# Patient Record
Sex: Female | Born: 1970 | Race: Black or African American | Hispanic: No | Smoking: Former smoker
Health system: Southern US, Community
[De-identification: ages and names within clinical notes are randomized; demographics above are authoritative.]

## PROBLEM LIST (undated history)

## (undated) DIAGNOSIS — F32A Depression, unspecified: Secondary | ICD-10-CM

## (undated) DIAGNOSIS — F419 Anxiety disorder, unspecified: Secondary | ICD-10-CM

## (undated) DIAGNOSIS — K219 Gastro-esophageal reflux disease without esophagitis: Secondary | ICD-10-CM

## (undated) DIAGNOSIS — I10 Essential (primary) hypertension: Secondary | ICD-10-CM

## (undated) DIAGNOSIS — Z860100 Personal history of colon polyps, unspecified: Secondary | ICD-10-CM

## (undated) DIAGNOSIS — U071 COVID-19: Secondary | ICD-10-CM

## (undated) DIAGNOSIS — J45909 Unspecified asthma, uncomplicated: Secondary | ICD-10-CM

## (undated) DIAGNOSIS — F4310 Post-traumatic stress disorder, unspecified: Secondary | ICD-10-CM

## (undated) DIAGNOSIS — M1990 Unspecified osteoarthritis, unspecified site: Secondary | ICD-10-CM

## (undated) HISTORY — DX: Depression, unspecified: F32.A

## (undated) HISTORY — PX: DILATION AND CURETTAGE OF UTERUS: SHX78

## (undated) HISTORY — PX: COLONOSCOPY: SHX174

## (undated) HISTORY — DX: Personal history of colon polyps, unspecified: Z86.0100

## (undated) HISTORY — PX: OTHER SURGICAL HISTORY: SHX169

## (undated) HISTORY — DX: Post-traumatic stress disorder, unspecified: F43.10

## (undated) HISTORY — PX: ABDOMINAL HYSTERECTOMY: SHX81

## (undated) HISTORY — DX: Personal history of colonic polyps: Z86.010

---

## 2011-11-22 ENCOUNTER — Encounter: Payer: Self-pay | Admitting: *Deleted

## 2011-11-22 ENCOUNTER — Emergency Department
Admission: EM | Admit: 2011-11-22 | Discharge: 2011-11-22 | Disposition: A | Discharge status: Home / Self Care | Payer: BC Managed Care – PPO | Source: Home / Self Care

## 2011-11-22 DIAGNOSIS — J069 Acute upper respiratory infection, unspecified: Secondary | ICD-10-CM

## 2011-11-22 MED ORDER — HYDROCODONE-HOMATROPINE 5-1.5 MG/5ML PO SYRP: 5.0000 mL | ORAL_SOLUTION | Freq: Four times a day (QID) | ORAL | Status: AC | PRN

## 2011-11-22 MED ORDER — FLUCONAZOLE 150 MG PO TABS: 150.0000 mg | ORAL_TABLET | Freq: Once | ORAL | Status: AC

## 2011-11-22 MED ORDER — AZITHROMYCIN 250 MG PO TABS: ORAL_TABLET | ORAL | Status: AC

## 2011-11-22 NOTE — ED Notes (Signed)
Patient c/o fatigue, cough and congestion x 4 days. Denies fever.

## 2011-11-22 NOTE — ED Provider Notes (Addendum)
History     CSN: [id number]  Arrival date & time 11/22/11  1220   None     No chief complaint on file.   (Consider location/radiation/quality/duration/timing/severity/associated sxs/prior treatment) HPI Teyla is a 41 y.o. female who complains of onset of cold symptoms for 4 days. Alka Seltzer helps a little.  There is a 22-month old in her house. 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 No sore throat + cough (main symptom) No pleuritic pain No wheezing + nasal congestion + post-nasal drainage + sinus pain/pressure No chest congestion No itchy/red eyes No earache No hemoptysis No SOB No chills/sweats No fever No nausea No vomiting No abdominal pain No diarrhea No skin rashes + fatigue No myalgias No headache    No past medical history on file.  No past surgical history on file.  No family history on file.  History  Substance Use Topics  . Smoking status: Not on file  . Smokeless tobacco: Not on file  . Alcohol Use: Not on file    OB History    No data available      Review of Systems  All other systems reviewed and are negative.    Allergies  Review of patient's allergies indicates not on file.  Home Medications  No current outpatient prescriptions on file.  There were no vitals taken for this visit.  Physical Exam  Nursing note and vitals reviewed. Constitutional: She is oriented to person, place, and time. She appears well-developed and well-nourished.  HENT:  Head: Normocephalic and atraumatic.  Eyes: No scleral icterus.  Neck: Neck supple.  Cardiovascular: Regular rhythm and normal heart sounds.   Pulmonary/Chest: Effort normal and breath sounds normal. No respiratory distress.  Neurological: She is alert and oriented to person, place, and time.  Skin: Skin is warm and dry.  Psychiatric: She has a normal mood and affect. Her speech is normal.    ED Course    Procedures (including critical care time)  Labs Reviewed - No data to display No results found.   No diagnosis found.    MDM  1)  Take the prescribed antibiotic as instructed.  Hold for a few days.  Take cough meds first since that's her main concern. 2)  Use nasal saline solution (over the counter) at least 3 times a day. 3)  Use over the counter decongestants like Zyrtec-D every 12 hours as needed to help with congestion.  If you have hypertension, do not take medicines with sudafed.  4)  Can take tylenol every 6 hours or motrin every 8 hours for pain or fever. 5)  Follow up with your primary doctor if no improvement in 5-7 days, sooner if increasing pain, fever, or new symptoms.     Lily Kocher, MD 11/22/11 1253  She has taken Rx cough meds in the past (hydrocodone) without any reaction.  Lily Kocher, MD 11/22/11 1255

## 2014-08-17 ENCOUNTER — Emergency Department
Admission: EM | Admit: 2014-08-17 | Discharge: 2014-08-17 | Disposition: A | Discharge status: Home / Self Care | Payer: 59 | Source: Home / Self Care | Attending: Emergency Medicine | Admitting: Emergency Medicine

## 2014-08-17 ENCOUNTER — Emergency Department (INDEPENDENT_AMBULATORY_CARE_PROVIDER_SITE_OTHER): Payer: 59

## 2014-08-17 ENCOUNTER — Encounter: Payer: Self-pay | Admitting: Emergency Medicine

## 2014-08-17 DIAGNOSIS — R062 Wheezing: Secondary | ICD-10-CM

## 2014-08-17 DIAGNOSIS — R059 Cough, unspecified: Secondary | ICD-10-CM

## 2014-08-17 DIAGNOSIS — R05 Cough: Secondary | ICD-10-CM

## 2014-08-17 DIAGNOSIS — J209 Acute bronchitis, unspecified: Secondary | ICD-10-CM

## 2014-08-17 HISTORY — DX: Unspecified asthma, uncomplicated: J45.909

## 2014-08-17 LAB — POCT URINALYSIS DIP (MANUAL ENTRY)
Bilirubin, UA: NEGATIVE
Glucose, UA: NEGATIVE
Ketones, POC UA: NEGATIVE
Leukocytes, UA: NEGATIVE
Nitrite, UA: NEGATIVE
Protein Ur, POC: NEGATIVE
Spec Grav, UA: 1.015 (ref 1.005–1.03)
Urobilinogen, UA: 2 (ref 0–1)
pH, UA: 7 (ref 5–8)

## 2014-08-17 MED ORDER — ALBUTEROL SULFATE HFA 108 (90 BASE) MCG/ACT IN AERS: 2.0000 | INHALATION_SPRAY | RESPIRATORY_TRACT | Status: DC | PRN

## 2014-08-17 MED ORDER — BENZONATATE 200 MG PO CAPS: ORAL_CAPSULE | ORAL | Status: DC

## 2014-08-17 MED ORDER — IPRATROPIUM-ALBUTEROL 0.5-2.5 (3) MG/3ML IN SOLN
3.0000 mL | RESPIRATORY_TRACT | Status: AC
  Administered 2014-08-17: 3 mL via RESPIRATORY_TRACT

## 2014-08-17 MED ORDER — PREDNISONE (PAK) 10 MG PO TABS: ORAL_TABLET | ORAL | Status: DC

## 2014-08-17 MED ORDER — AZITHROMYCIN 250 MG PO TABS: ORAL_TABLET | ORAL | Status: DC

## 2014-08-17 NOTE — ED Provider Notes (Signed)
CSN: [id number]     Arrival date & time 08/17/14  1522 History   First MD Initiated Contact with Patient 08/17/14 1525     Chief Complaint  Patient presents with  . Cough  . Nasal Congestion  . Shortness of Breath  . Dysuria   (Consider location/radiation/quality/duration/timing/severity/associated sxs/prior Treatment) HPI States began feeling ill 2 days ago; cough, congestion, shortness of breath/wheezing, and has noticed dysuria today. Taking Nyquil and Mucinex. She tried her granddaughter's nebulizer and that helped yesterday. URI HISTORY  Heather Salazar is a 43 y.o. female who complains of onset of symptoms for several days.  Positive chills/sweats +  mildFever  +  Nasal congestion +  Discolored Post-nasal drainage mild sinus pain/pressure No sore throat  +  cough Positive wheezing positive chest congestion No hemoptysis mild shortness of breath No pleuritic pain  No itchy/red eyes No earache  No nausea No vomiting No abdominal pain No diarrhea  No skin rashes +  Fatigue No myalgias No headache   Past Medical History  Diagnosis Date  . Asthma     childhood   Past Surgical History  Procedure Laterality Date  . Cesarean section     Family History  Problem Relation Age of Onset  . Asthma Mother   . Heart failure Father   . Asthma Sister   . Asthma Brother   . Diabetes Neg Hx    History  Substance Use Topics  . Smoking status: Current Every Day Smoker -- 0.50 packs/day  . Smokeless tobacco: Not on file  . Alcohol Use: Yes   OB History    No data available     Review of Systems  All other systems reviewed and are negative.   Allergies  Acetaminophen-codeine and Iodine solution  Home Medications   Prior to Admission medications   Medication Sig Start Date End Date Taking? Authorizing Provider  albuterol (PROVENTIL HFA;VENTOLIN HFA) 108 (90 BASE) MCG/ACT inhaler Inhale 2 puffs into the lungs every 4 (four) hours as needed for wheezing or  shortness of breath. 08/17/14   Jacqulyn Cane, MD  azithromycin (ZITHROMAX Z-PAK) 250 MG tablet Take 2 tablets on day one, then 1 tablet daily on days 2 through 5 08/17/14   Jacqulyn Cane, MD  benzonatate (TESSALON) 200 MG capsule Take 1 every 8 hours as needed for cough. 08/17/14   Jacqulyn Cane, MD  predniSONE (STERAPRED UNI-PAK) 10 MG tablet Take as directed for 6 days.--Take 6 on day 1, 5 on day 2, 4 on day 3, then 3 tablets on day 4, then 2 tablets on day 5, then 1 on day 6. 08/17/14   Jacqulyn Cane, MD   BP 138/91 mmHg  Pulse 95  Temp(Src) 97.7 F (36.5 C) (Oral)  Resp 16  Ht 5' 5"  (1.651 m)  Wt 220 lb (99.791 kg)  BMI 36.61 kg/m2  SpO2 100%  LMP 08/05/2014 Physical Exam  Constitutional: She is oriented to person, place, and time. She appears well-developed and well-nourished. No distress.  HENT:  Head: Normocephalic and atraumatic.  Right Ear: Tympanic membrane, external ear and ear canal normal.  Left Ear: Tympanic membrane, external ear and ear canal normal.  Nose: Mucosal edema and rhinorrhea present. Right sinus exhibits maxillary sinus tenderness. Left sinus exhibits maxillary sinus tenderness.  Mouth/Throat: Oropharynx is clear and moist. No oral lesions. No oropharyngeal exudate.  Eyes: Right eye exhibits no discharge. Left eye exhibits no discharge. No scleral icterus.  Neck: Neck supple.  Cardiovascular: Normal rate, regular rhythm and  normal heart sounds.   Pulmonary/Chest: Effort normal. She has wheezes. She has rhonchi. She has no rales.  Abdominal: Soft. There is no tenderness.  Lymphadenopathy:    She has no cervical adenopathy.  Neurological: She is alert and oriented to person, place, and time.  Skin: Skin is warm and dry.  Psychiatric: She has a normal mood and affect.  Nursing note and vitals reviewed.   ED Course  Procedures (including critical care time) Labs Review Labs Reviewed  URINE CULTURE  WOUND CULTURE  POCT URINALYSIS DIP (MANUAL ENTRY)  at her  request, we checked UA, which was negative, except for trace blood. Likely not significant, but we'll send off urine culture, and she'll follow up with PCP.  Imaging Review Dg Chest 2 View  08/17/2014   CLINICAL DATA:  Four-day history of coughing and wheezing.  EXAM: CHEST  2 VIEW  COMPARISON:  None.  FINDINGS: The heart size and mediastinal contours are within normal limits. Both lungs are clear. The visualized skeletal structures are unremarkable.  IMPRESSION: Normal chest x-ray.   Electronically Signed   By: Kalman Jewels M.D.   On: 08/17/2014 16:52     MDM   1. Acute bronchitis with bronchospasm   2. Cough   3. Wheezing   DuoNeb nebulizer treatment given. Wheezing improved. Chest x-ray ordered.--within normal limits.  Treatment options discussed, as well as risks, benefits, alternatives. Patient voiced understanding and agreement with the following plans: Discharge Medication List as of 08/17/2014  5:32 PM    START taking these medications   Details  albuterol (PROVENTIL HFA;VENTOLIN HFA) 108 (90 BASE) MCG/ACT inhaler Inhale 2 puffs into the lungs every 4 (four) hours as needed for wheezing or shortness of breath., Starting 08/17/2014, Until Discontinued, Print    azithromycin (ZITHROMAX Z-PAK) 250 MG tablet Take 2 tablets on day one, then 1 tablet daily on days 2 through 5, Print    benzonatate (TESSALON) 200 MG capsule Take 1 every 8 hours as needed for cough., Print    predniSONE (STERAPRED UNI-PAK) 10 MG tablet Take as directed for 6 days.--Take 6 on day 1, 5 on day 2, 4 on day 3, then 3 tablets on day 4, then 2 tablets on day 5, then 1 on day 6., Print       Other symptomatic care discussed. Follow-up with your primary care doctor in 5-7 days if not improving, or sooner if symptoms become worse. Precautions discussed. Red flags discussed. Questions invited and answered. Patient voiced understanding and agreement.     Jacqulyn Cane, MD 08/17/14 [phone]

## 2014-08-17 NOTE — ED Notes (Signed)
States began feeling ill 2 days ago; cough, congestion, shortness of breath/wheezing, and has noticed dysuria today. Taking Nyquil and Mucinex.

## 2014-08-19 LAB — URINE CULTURE: Colony Count: >=100000

## 2014-08-19 MED ORDER — CEPHALEXIN 500 MG PO CAPS: 500.0000 mg | ORAL_CAPSULE | Freq: Three times a day (TID) | ORAL | Status: DC

## 2014-08-20 LAB — WOUND CULTURE
Gram Stain: NONE SEEN
Gram Stain: NONE SEEN
Gram Stain: NONE SEEN

## 2014-09-02 ENCOUNTER — Telehealth: Payer: Self-pay | Admitting: *Deleted

## 2014-09-27 ENCOUNTER — Encounter: Payer: Self-pay | Admitting: *Deleted

## 2014-09-27 ENCOUNTER — Emergency Department
Admission: EM | Admit: 2014-09-27 | Discharge: 2014-09-27 | Disposition: A | Discharge status: Home / Self Care | Payer: 59 | Source: Home / Self Care | Attending: Family Medicine | Admitting: Family Medicine

## 2014-09-27 ENCOUNTER — Emergency Department (INDEPENDENT_AMBULATORY_CARE_PROVIDER_SITE_OTHER): Payer: 59

## 2014-09-27 DIAGNOSIS — X58XXXA Exposure to other specified factors, initial encounter: Secondary | ICD-10-CM

## 2014-09-27 DIAGNOSIS — S99911A Unspecified injury of right ankle, initial encounter: Secondary | ICD-10-CM

## 2014-09-27 DIAGNOSIS — S82434A Nondisplaced oblique fracture of shaft of right fibula, initial encounter for closed fracture: Secondary | ICD-10-CM

## 2014-09-27 DIAGNOSIS — S82401A Unspecified fracture of shaft of right fibula, initial encounter for closed fracture: Secondary | ICD-10-CM

## 2014-09-27 MED ORDER — OXYCODONE-ACETAMINOPHEN 5-325 MG PO TABS: 1.0000 | ORAL_TABLET | Freq: Four times a day (QID) | ORAL | Status: DC | PRN

## 2014-09-27 NOTE — Discharge Instructions (Signed)
Wear cast boot.  Use crutches.  Elevated leg when possible.  Apply ice pack for 20 to 30 minutes, 3 to 4 times daily  Continue until pain decreases.   Ensure adequate Vitamin D and calcium (600units vitamin D and 1052m calcium)   Tibial and Fibular Fracture, Adult Tibial and fibular fracture is a break in the bones of your lower leg (tibia and fibula). The tibia is the larger of these two bones. The fibula is the smaller of the two bones. It is on the outer side of your leg.  CAUSES  Low-energy injuries, such as a fall from ground level.  High-energy injuries, such as motor vehicle injuries, gunshot wounds, or high-speed sports collisions. RISK FACTORS  Jumping activities.  Repetitive stress, such as long-distance running.  Participation in sports.  Osteoporosis.  Advanced age. SIGNS AND SYMPTOMS  Pain.  Swelling.  Inability to put weight on your injured leg.  Bone deformities at the site of your injury.  Bruising. DIAGNOSIS  Tibial and fibular fractures are diagnosed with the use of X-ray exams. TREATMENT  If you have a simple fracture of these two bones, they can be treated with simple immobilization. A cast or splint will be used on your leg to keep it from moving while it heals. Then you can begin range-of-motion exercises to regain your knee motion. HOME CARE INSTRUCTIONS   Apply ice to your leg:  Put ice in a plastic bag.  Place a towel between your skin and the bag.  Leave the ice on for 20 minutes, 2-3 times a day.  If you have a plaster or fiberglass cast:  Do not try to scratch the skin under the cast using sharp or pointed objects.  Check the skin around the cast every day. You may put lotion on any red or sore areas.  Keep your cast dry and clean.  If you have a plaster splint:  Wear the splint as directed.  You may loosen the elastic around the splint if your toes become numb, tingle, or turn cold or blue.  Do not put pressure on any part of  your cast or splint until it is fully hardened, because it may deform.  Your cast or splint can be protected during bathing with a plastic bag. Do not lower the cast or splint into water.  Use crutches as directed.  Only take over-the-counter or prescription medicines for pain, discomfort, or fever as directed by your health care provider.  Follow all instructions given to you by your health care provider.  Make and keep all follow-up appointments. SEEK MEDICAL CARE IF:  Your pain is becoming worse rather than better or is not controlled with medicines.  You have increased swelling or redness in the foot.  You begin to lose feeling in your foot or toes. SEEK IMMEDIATE MEDICAL CARE IF:  You develop a cold or blue foot or toes on the injured side.  You develop severe pain in your injured leg, especially if the pain is increased with movement of your toes. MAKE SURE YOU:  Understand these instructions.  Will watch your condition.  Will get help right away if you are not doing well or get worse. Document Released: 06/18/2002 Document Revised: 07/17/2013 Document Reviewed: 05/08/2013 ELincoln HospitalPatient Information 2015 [place] This information is not intended to replace advice given to you by your health care provider. Make sure you discuss any questions you have with your health care provider.

## 2014-09-27 NOTE — ED Notes (Signed)
Pt fell last night and hurt her L foot on the side and her R foot and ankle  Pain 8/10 when bearing weight.  No previous injury.

## 2014-09-27 NOTE — ED Provider Notes (Signed)
CSN: [id number]     Arrival date & time 09/27/14  1326 History   First MD Initiated Contact with Patient 09/27/14 1440     Chief Complaint  Patient presents with  . Ankle Pain    R  . Foot Pain    bilateral      HPI Comments: Yesterday patient slipped down two steps, twisting/everting her right ankle.  She has had persistent pain and swelling.  Patient is a 43 y.o. female presenting with ankle pain. The history is provided by the patient and the spouse.  Ankle Pain Location:  Ankle Time since incident:  1 day Injury: yes   Mechanism of injury comment:  Tripped on stairs Ankle location:  R ankle Pain details:    Quality:  Aching   Radiates to:  Does not radiate   Severity:  Moderate   Onset quality:  Sudden   Duration:  1 day   Timing:  Constant   Progression:  Unchanged Chronicity:  New Foreign body present:  No foreign bodies Prior injury to area:  No Relieved by:  Nothing Worsened by:  Bearing weight and activity Ineffective treatments:  NSAIDs Associated symptoms: decreased ROM, stiffness and swelling   Associated symptoms: no back pain, no fever, no muscle weakness, no numbness and no tingling   Risk factors: obesity     Past Medical History  Diagnosis Date  . Asthma     childhood   Past Surgical History  Procedure Laterality Date  . Cesarean section     Family History  Problem Relation Age of Onset  . Asthma Mother   . Heart failure Father   . Asthma Sister   . Asthma Brother   . Diabetes Neg Hx    History  Substance Use Topics  . Smoking status: Current Every Day Smoker -- 0.50 packs/day  . Smokeless tobacco: Never Used  . Alcohol Use: Yes   OB History    No data available     Review of Systems  Constitutional: Negative for fever.  Musculoskeletal: Positive for stiffness. Negative for back pain.    Allergies  Acetaminophen-codeine and Iodine solution  Home Medications   Prior to Admission medications   Medication Sig Start Date End  Date Taking? Authorizing Provider  diclofenac (VOLTAREN) 75 MG EC tablet Take 75 mg by mouth 2 (two) times daily.   Yes Historical Provider, MD  albuterol (PROVENTIL HFA;VENTOLIN HFA) 108 (90 BASE) MCG/ACT inhaler Inhale 2 puffs into the lungs every 4 (four) hours as needed for wheezing or shortness of breath. 08/17/14   Jacqulyn Cane, MD  azithromycin (ZITHROMAX Z-PAK) 250 MG tablet Take 2 tablets on day one, then 1 tablet daily on days 2 through 5 08/17/14   Jacqulyn Cane, MD  benzonatate (TESSALON) 200 MG capsule Take 1 every 8 hours as needed for cough. 08/17/14   Jacqulyn Cane, MD  cephALEXin (KEFLEX) 500 MG capsule Take 1 capsule (500 mg total) by mouth 3 (three) times daily. For 7 days, for uti 08/19/14   Jacqulyn Cane, MD  oxyCODONE-acetaminophen (ROXICET) 5-325 MG per tablet Take 1 tablet by mouth every 6 (six) hours as needed for severe pain. 09/27/14   Kandra Nicolas, MD  predniSONE (STERAPRED UNI-PAK) 10 MG tablet Take as directed for 6 days.--Take 6 on day 1, 5 on day 2, 4 on day 3, then 3 tablets on day 4, then 2 tablets on day 5, then 1 on day 6. 08/17/14   Jacqulyn Cane, MD  BP 118/82 mmHg  Pulse 91  Temp(Src) 98.2 F (36.8 C) (Oral)  Ht 5' 5"  (1.651 m)  Wt 220 lb (99.791 kg)  BMI 36.61 kg/m2  SpO2 100% Physical Exam  Constitutional: She is oriented to person, place, and time. She appears well-developed and well-nourished. No distress.  Patient is obese (BMI 36.6)  HENT:  Head: Atraumatic.  Eyes: Pupils are equal, round, and reactive to light.  Musculoskeletal:       Right ankle: She exhibits decreased range of motion and swelling. She exhibits no ecchymosis, no deformity, no laceration and normal pulse. Tenderness. Lateral malleolus, medial malleolus and proximal fibula tenderness found. No head of 5th metatarsal tenderness found. Achilles tendon normal.       Feet:  Neurological: She is alert and oriented to person, place, and time.  Skin: Skin is warm and dry.  Nursing note  and vitals reviewed.   ED Course  Procedures  none    Imaging Review Dg Ankle Complete Right  09/27/2014   CLINICAL DATA:  Pain over the medial and lateral malleoli of the right ankle  EXAM: RIGHT ANKLE - COMPLETE 3+ VIEW  COMPARISON:  None.  FINDINGS: There is a oblique fracture of the in the right distal fibular diaphysis. There is no other fracture or dislocation. There is relative pes planus. 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 Soft tissues swelling over the lateral malleolus.  IMPRESSION: Oblique fracture of the right distal fibular diaphysis with overlying soft tissue swelling.   Electronically Signed   By: Kathreen Devoid   On: 09/27/2014 15:28     MDM   1. Fracture of right fibula, closed, initial encounter   2. Right ankle injury    Cast boot applied.  Crutches dispensed. Wear cast boot.  Use crutches.  Elevated leg when possible.  Apply ice pack for 20 to 30 minutes, 3 to 4 times daily  Continue until pain decreases.   Ensure adequate Vitamin D and calcium (600units vitamin D and 1059m calcium) Refer to Dr. TAundria Memsfor fracture management and follow-up    SKandra Nicolas MD 10/04/14 [phone]

## 2014-09-29 ENCOUNTER — Ambulatory Visit (INDEPENDENT_AMBULATORY_CARE_PROVIDER_SITE_OTHER): Payer: 59

## 2014-09-29 ENCOUNTER — Ambulatory Visit (INDEPENDENT_AMBULATORY_CARE_PROVIDER_SITE_OTHER): Payer: 59 | Admitting: Sports Medicine

## 2014-09-29 ENCOUNTER — Encounter: Payer: Self-pay | Admitting: Sports Medicine

## 2014-09-29 VITALS — BP 120/87 | HR 100 | Ht 65.0 in

## 2014-09-29 DIAGNOSIS — S99922A Unspecified injury of left foot, initial encounter: Secondary | ICD-10-CM

## 2014-09-29 DIAGNOSIS — S82434D Nondisplaced oblique fracture of shaft of right fibula, subsequent encounter for closed fracture with routine healing: Secondary | ICD-10-CM

## 2014-09-29 DIAGNOSIS — S82401A Unspecified fracture of shaft of right fibula, initial encounter for closed fracture: Secondary | ICD-10-CM

## 2014-09-29 DIAGNOSIS — M2142 Flat foot [pes planus] (acquired), left foot: Secondary | ICD-10-CM

## 2014-09-29 DIAGNOSIS — X58XXXD Exposure to other specified factors, subsequent encounter: Secondary | ICD-10-CM

## 2014-09-29 DIAGNOSIS — S92002A Unspecified fracture of left calcaneus, initial encounter for closed fracture: Secondary | ICD-10-CM | POA: Insufficient documentation

## 2014-09-29 MED ORDER — HYDROCODONE-ACETAMINOPHEN 10-325 MG PO TABS: 1.0000 | ORAL_TABLET | Freq: Three times a day (TID) | ORAL | Status: DC | PRN

## 2014-09-29 NOTE — Progress Notes (Signed)
   Subjective:    I'm seeing this patient as a consultation for:  Dr. Assunta Found  CC: Right leg fracture  HPI: Right leg: Several days ago this pleasant 43 year old female fell down the stairs, she had immediate pain and swelling in both legs, worse on the right leg. She was seen in urgent care where x-rays showed a torus-type fracture through the fibular shaft. Nondisplaced and not angulated, she was placed in a boot and given Percocet for pain and referred to me for further evaluation and definitive treatment.  Left foot pain. She also injured her left foot during the fall, she has pain, swelling, bruising localized over the dorsal lateral talus. Pain is moderate, persistent.  Past medical history, Surgical history, Family history not pertinant except as noted below, Social history, Allergies, and medications have been entered into the medical record, reviewed, and no changes needed.   Review of Systems: No headache, visual changes, nausea, vomiting, diarrhea, constipation, dizziness, abdominal pain, skin rash, fevers, chills, night sweats, weight loss, swollen lymph nodes, body aches, joint swelling, muscle aches, chest pain, shortness of breath, mood changes, visual or auditory hallucinations.   Objective:   General: Well Developed, well nourished, and in no acute distress.  Neuro/Psych: Alert and oriented x3, extra-ocular muscles intact, able to move all 4 extremities, sensation grossly intact. Skin: Warm and dry, no rashes noted.  Respiratory: Not using accessory muscles, speaking in full sentences, trachea midline.  Cardiovascular: Pulses palpable, no extremity edema. Abdomen: Does not appear distended. Right leg: Tender to palpation of the mid fibular shaft with palpable deformity, there is significant swelling and bruising, she is neurovascularly intact distally. Left foot: Tender to palpation over the anterior process of the calcaneus versus the anterolateral talus. Neurovascularly  intact distally, there is swelling and bruising.  Left and right ankles/feet were strapped with compressive dressing.  Right fibular x-ray shows continued stability of the fracture, left foot x-ray shows some osteoarthritis but no fractures.  Impression and Recommendations:   This case required medical decision making of moderate complexity.

## 2014-09-29 NOTE — Assessment & Plan Note (Signed)
Continue cast boot for now. Switching to hydrocodone, itching on oxycodone. X-rays. Return in one week for consideration of cast, too much swelling today.  I billed a fracture code for this encounter, all subsequent visits will be post-op checks in the global period.

## 2014-09-29 NOTE — Assessment & Plan Note (Signed)
Also with pain, bruising over the dorsolateral foot. X-rays to evaluate for fracture. Strap with compressive dressing. If she does have a fracture we would place her in a postop shoe. Out of work for one week.

## 2014-10-06 ENCOUNTER — Ambulatory Visit (INDEPENDENT_AMBULATORY_CARE_PROVIDER_SITE_OTHER): Payer: 59 | Admitting: Sports Medicine

## 2014-10-06 ENCOUNTER — Telehealth: Payer: Self-pay | Admitting: *Deleted

## 2014-10-06 ENCOUNTER — Ambulatory Visit: Payer: 59

## 2014-10-06 ENCOUNTER — Ambulatory Visit (INDEPENDENT_AMBULATORY_CARE_PROVIDER_SITE_OTHER): Payer: 59

## 2014-10-06 ENCOUNTER — Encounter: Payer: Self-pay | Admitting: Sports Medicine

## 2014-10-06 VITALS — BP 123/86 | HR 89 | Ht 65.0 in | Wt 234.0 lb

## 2014-10-06 DIAGNOSIS — S92002A Unspecified fracture of left calcaneus, initial encounter for closed fracture: Secondary | ICD-10-CM | POA: Diagnosis not present

## 2014-10-06 DIAGNOSIS — S99922A Unspecified injury of left foot, initial encounter: Secondary | ICD-10-CM

## 2014-10-06 DIAGNOSIS — S82401A Unspecified fracture of shaft of right fibula, initial encounter for closed fracture: Secondary | ICD-10-CM

## 2014-10-06 DIAGNOSIS — S82401D Unspecified fracture of shaft of right fibula, subsequent encounter for closed fracture with routine healing: Secondary | ICD-10-CM

## 2014-10-06 MED ORDER — HYDROCODONE-ACETAMINOPHEN 10-325 MG PO TABS: 1.0000 | ORAL_TABLET | Freq: Three times a day (TID) | ORAL | Status: DC | PRN

## 2014-10-06 NOTE — Telephone Encounter (Signed)
While Heather Salazar was in office receiving her post op shoe she requested a work note so that she is able to work from home. She said that her boss is going to bring her a lap top so that she can work from home.

## 2014-10-06 NOTE — Assessment & Plan Note (Addendum)
Persistent pain, swelling, bruising over the dorsum of the midfoot and the metatarsal shafts, x-rays were negative, we are going to obtain a CT for further evaluation of an occult fracture.  CT confirms calcaneal fracture, return for postop shoe.  I billed a fracture code for this encounter, all subsequent visits will be post-op checks in the global period.

## 2014-10-06 NOTE — Telephone Encounter (Signed)
Letter is in my box

## 2014-10-06 NOTE — Progress Notes (Addendum)
  Subjective:    CC: follow-up  HPI: Right fibular fracture: Approximately 1.5 weeks post fractures, doing well in a boot, still with some pain and needs a refill on Vicodin.  Left foot pain: Localized over the dorsal lateral midfoot, x-rays were negative however she continues to have severe pain and swelling,persistent.  Past medical history, Surgical history, Family history not pertinant except as noted below, Social history, Allergies, and medications have been entered into the medical record, reviewed, and no changes needed.   Review of Systems: No fevers, chills, night sweats, weight loss, chest pain, or shortness of breath.   Objective:    General: Well Developed, well nourished, and in no acute distress.  Neuro: Alert and oriented x3, extra-ocular muscles intact, sensation grossly intact.  HEENT: Normocephalic, atraumatic, pupils equal round reactive to light, neck supple, no masses, no lymphadenopathy, thyroid nonpalpable.  Skin: Warm and dry, no rashes. Cardiac: Regular rate and rhythm, no murmurs rubs or gallops, no lower extremity edema.  Respiratory: Clear to auscultation bilaterally. Not using accessory muscles, speaking in full sentences. Right ankle: Still tender to palpation over the fracture site. Neurovascularly intact distally. Left foot pain: Persistent pain over the dorsum of the midfoot and over the metatarsal shafts.  I strapped both the left and right ankles with compressive dressing.  CT does show nondisplaced avulsion fracture from the anterior process of the calcaneus.  Impression and Recommendations:

## 2014-10-06 NOTE — Telephone Encounter (Signed)
CT scan [id number] approved [phone] expires 11/20/14. Jenna in radiology notified.

## 2014-10-06 NOTE — Assessment & Plan Note (Signed)
Doing as expected post fracture. Continue boot. Expect 6-8 weeks for full healing, continue nonweightbearing.  Return in 2 weeks.

## 2014-10-07 NOTE — Telephone Encounter (Signed)
Heather Salazar was contacted on her mobile # and she said to fax work slip to [place]: Heather Salazar @ [phone]. [employer] work slip is at front desk for her to pick up at her request.

## 2014-10-07 NOTE — Telephone Encounter (Signed)
Unable to reach [facility]. No answer. No voicemail.

## 2014-10-20 ENCOUNTER — Ambulatory Visit (INDEPENDENT_AMBULATORY_CARE_PROVIDER_SITE_OTHER): Payer: 59 | Admitting: Sports Medicine

## 2014-10-20 ENCOUNTER — Encounter: Payer: Self-pay | Admitting: Sports Medicine

## 2014-10-20 ENCOUNTER — Encounter: Payer: Self-pay | Admitting: *Deleted

## 2014-10-20 VITALS — BP 134/84 | HR 74 | Ht 65.0 in | Wt 237.0 lb

## 2014-10-20 DIAGNOSIS — S82401A Unspecified fracture of shaft of right fibula, initial encounter for closed fracture: Secondary | ICD-10-CM

## 2014-10-20 DIAGNOSIS — S82401D Unspecified fracture of shaft of right fibula, subsequent encounter for closed fracture with routine healing: Secondary | ICD-10-CM

## 2014-10-20 DIAGNOSIS — S92002A Unspecified fracture of left calcaneus, initial encounter for closed fracture: Secondary | ICD-10-CM

## 2014-10-20 MED ORDER — HYDROCODONE-ACETAMINOPHEN 10-325 MG PO TABS: 1.0000 | ORAL_TABLET | Freq: Two times a day (BID) | ORAL | Status: DC | PRN

## 2014-10-20 NOTE — Assessment & Plan Note (Signed)
Continue boot. Return in 2 weeks.

## 2014-10-20 NOTE — Assessment & Plan Note (Signed)
Continue postop shoe, return in 2 weeks.

## 2014-10-20 NOTE — Progress Notes (Signed)
  Subjective: 3.5 weeks post right lateral malleolus and left lateral calcaneal fracture, doing well. Unfortunately she has been smoking.   Objective: General: Well-developed, well-nourished, and in no acute distress. Right ankle: Still with significant tenderness over the fracture site.  Left foot: Tender to palpation over the lateral calcaneus.  Left foot and right ankle were strapped with compressive dressing, scaphoid pads were placed in her postop shoe and cam boot.  Assessment/plan:

## 2014-11-03 ENCOUNTER — Ambulatory Visit (INDEPENDENT_AMBULATORY_CARE_PROVIDER_SITE_OTHER): Payer: 59 | Admitting: Sports Medicine

## 2014-11-03 ENCOUNTER — Encounter: Payer: Self-pay | Admitting: Sports Medicine

## 2014-11-03 VITALS — BP 137/90 | HR 87 | Ht 65.0 in

## 2014-11-03 DIAGNOSIS — S82401D Unspecified fracture of shaft of right fibula, subsequent encounter for closed fracture with routine healing: Secondary | ICD-10-CM

## 2014-11-03 DIAGNOSIS — S92002A Unspecified fracture of left calcaneus, initial encounter for closed fracture: Secondary | ICD-10-CM

## 2014-11-03 NOTE — Assessment & Plan Note (Signed)
5-1/2 weeks post fracture, anticipate 2 more weeks in the boot, then okay to return to work.

## 2014-11-03 NOTE — Progress Notes (Signed)
  Subjective: Approximately 5-1/2 weeks post fracture, left calcaneal fractures doing well, almost pain-free, she has been using a standard tennis shoe without pain. Her right fibular fracture still has some tenderness over the fracture, she has been intermittent with her boot.  Objective: General: Well-developed, well-nourished, and in no acute distress. Left foot: No longer tender to palpation over the fracture, good motion. Right ankle: Still tender to palpation over the fracture site, good motion, good strength.  Assessment/plan:

## 2014-11-03 NOTE — Assessment & Plan Note (Signed)
Clinically resolved. Return as needed, okay to transition into a regular shoe now. Of note she has not needed any hydrocodone over the past 2 days.

## 2014-11-17 ENCOUNTER — Encounter: Payer: Self-pay | Admitting: Sports Medicine

## 2014-11-17 ENCOUNTER — Ambulatory Visit (INDEPENDENT_AMBULATORY_CARE_PROVIDER_SITE_OTHER): Payer: 59 | Admitting: Sports Medicine

## 2014-11-17 VITALS — BP 151/94 | HR 90 | Wt 231.0 lb

## 2014-11-17 DIAGNOSIS — S82401D Unspecified fracture of shaft of right fibula, subsequent encounter for closed fracture with routine healing: Secondary | ICD-10-CM

## 2014-11-17 NOTE — Progress Notes (Signed)
  Subjective: 7.5 weeks post fracture of the right distal fibula, doing well and pain-free at this time, still gets occasional swelling which is expected.  Left mid foot fracture has completely resolved.   Objective: General: Well-developed, well-nourished, and in no acute distress. Right Ankle: No visible erythema or swelling. Range of motion is full in all directions. Strength is 5/5 in all directions. Stable lateral and medial ligaments; squeeze test and kleiger test unremarkable; Talar dome nontender; No pain at base of 5th MT; No tenderness over cuboid; No tenderness over N spot or navicular prominence No tenderness on posterior aspects of lateral and medial malleolus No sign of peroneal tendon subluxations; Negative tarsal tunnel tinel's Able to walk 4 steps.  Assessment/plan:

## 2014-11-17 NOTE — Assessment & Plan Note (Signed)
Clinically resolved, return as needed. We did discuss an exercise prescription.

## 2015-01-16 ENCOUNTER — Ambulatory Visit (INDEPENDENT_AMBULATORY_CARE_PROVIDER_SITE_OTHER): Payer: 59 | Admitting: Sports Medicine

## 2015-01-16 ENCOUNTER — Encounter: Payer: Self-pay | Admitting: Sports Medicine

## 2015-01-16 VITALS — BP 135/91 | HR 72 | Ht 65.0 in | Wt 229.0 lb

## 2015-01-16 DIAGNOSIS — E669 Obesity, unspecified: Secondary | ICD-10-CM

## 2015-01-16 DIAGNOSIS — Z90710 Acquired absence of both cervix and uterus: Secondary | ICD-10-CM | POA: Insufficient documentation

## 2015-01-16 DIAGNOSIS — N951 Menopausal and female climacteric states: Secondary | ICD-10-CM

## 2015-01-16 DIAGNOSIS — E282 Polycystic ovarian syndrome: Secondary | ICD-10-CM | POA: Insufficient documentation

## 2015-01-16 DIAGNOSIS — Z0000 Encounter for general adult medical examination without abnormal findings: Secondary | ICD-10-CM | POA: Diagnosis not present

## 2015-01-16 MED ORDER — PHENTERMINE HCL 37.5 MG PO TABS: ORAL_TABLET | ORAL | Status: DC

## 2015-01-16 NOTE — Progress Notes (Signed)
  Subjective:    CC: Establish care  HPI: Foot fractures: Resolved.  Preventive measures: Up-to-date on breast cancer and cervical cancer screening as well as vaccinations.  Perimenopause: Patient is concerned and would like blood work checked. She still has regular periods monthly, and no symptoms of vasomotor instability.  Obesity: Has tried dieting but unfortunately is unable to surpass her voracious appetite.  Past medical history, Surgical history, Family history not pertinant except as noted below, Social history, Allergies, and medications have been entered into the medical record, reviewed, and no changes needed.   Review of Systems: No fevers, chills, night sweats, weight loss, chest pain, or shortness of breath.   Objective:    General: Well Developed, well nourished, and in no acute distress.  Neuro: Alert and oriented x3, extra-ocular muscles intact, sensation grossly intact.  HEENT: Normocephalic, atraumatic, pupils equal round reactive to light, neck supple, no masses, no lymphadenopathy, thyroid nonpalpable.  Skin: Warm and dry, no rashes. Cardiac: Regular rate and rhythm, no murmurs rubs or gallops, no lower extremity edema.  Respiratory: Clear to auscultation bilaterally. Not using accessory muscles, speaking in full sentences.  Impression and Recommendations:

## 2015-01-16 NOTE — Assessment & Plan Note (Signed)
Per patient request checking fertility panel. Patient feels as though she is perimenopausal.

## 2015-01-16 NOTE — Assessment & Plan Note (Signed)
Overall doing well. Checking routine blood work, up-to-date on breast cancer and cervical cancer screening as well as vaccinations.

## 2015-01-16 NOTE — Assessment & Plan Note (Signed)
Starting phentermine, return monthly for weight checks and refills.

## 2015-01-20 LAB — CBC
HCT: 44 % (ref 36.0–46.0)
Hemoglobin: 14.6 g/dL (ref 12.0–15.0)
MCH: 28.7 pg (ref 26.0–34.0)
MCHC: 33.2 g/dL (ref 30.0–36.0)
MCV: 86.4 fL (ref 78.0–100.0)
MPV: 10.3 fL (ref 8.6–12.4)
Platelets: 339 K/uL (ref 150–400)
RBC: 5.09 MIL/uL (ref 3.87–5.11)
RDW: 14.7 % (ref 11.5–15.5)
WBC: 5.2 K/uL (ref 4.0–10.5)

## 2015-01-20 LAB — COMPREHENSIVE METABOLIC PANEL WITH GFR
AST: 21 U/L (ref 0–37)
Albumin: 4 g/dL (ref 3.5–5.2)
BUN: 10 mg/dL (ref 6–23)
Calcium: 9 mg/dL (ref 8.4–10.5)
Chloride: 103 meq/L (ref 96–112)
Glucose, Bld: 86 mg/dL (ref 70–99)
Potassium: 4 meq/L (ref 3.5–5.3)
Total Protein: 7.3 g/dL (ref 6.0–8.3)

## 2015-01-20 LAB — LIPID PANEL
Cholesterol: 171 mg/dL (ref 0–200)
HDL: 61 mg/dL (ref >=46)
LDL Cholesterol: 88 mg/dL (ref 0–99)
Total CHOL/HDL Ratio: 2.8 ratio
Triglycerides: 109 mg/dL (ref <150)
VLDL: 22 mg/dL (ref 0–40)

## 2015-01-20 LAB — LUTEINIZING HORMONE: LH: 13.2 m[IU]/mL

## 2015-01-20 LAB — PROGESTERONE: Progesterone: 1.1 ng/mL

## 2015-01-20 LAB — COMPREHENSIVE METABOLIC PANEL
ALT: 25 U/L (ref 0–35)
Alkaline Phosphatase: 79 U/L (ref 39–117)
CO2: 24 mEq/L (ref 19–32)
Creat: 0.95 mg/dL (ref 0.50–1.10)
Sodium: 138 mEq/L (ref 135–145)
Total Bilirubin: 0.6 mg/dL (ref 0.2–1.2)

## 2015-01-20 LAB — HEMOGLOBIN A1C
Hgb A1c MFr Bld: 5.8 % — ABNORMAL HIGH (ref <5.7)
Mean Plasma Glucose: 120 mg/dL — ABNORMAL HIGH (ref <117)

## 2015-01-20 LAB — PROLACTIN: Prolactin: 11.9 ng/mL

## 2015-01-20 LAB — TESTOSTERONE, FREE, TOTAL, SHBG
Sex Hormone Binding: 68 nmol/L (ref 17–124)
Testosterone, Free: 8.3 pg/mL — ABNORMAL HIGH (ref 0.6–6.8)
Testosterone-% Free: 1.1 % (ref 0.4–2.4)
Testosterone: 74 ng/dL — ABNORMAL HIGH (ref 10–70)

## 2015-01-20 LAB — TSH: TSH: 1.732 u[IU]/mL (ref 0.350–4.500)

## 2015-01-20 LAB — FOLLICLE STIMULATING HORMONE: FSH: 9.3 m[IU]/mL

## 2015-01-20 LAB — VITAMIN D 25 HYDROXY (VIT D DEFICIENCY, FRACTURES): Vit D, 25-Hydroxy: 13 ng/mL — ABNORMAL LOW (ref 30–100)

## 2015-01-23 LAB — ESTROGENS, TOTAL: Estrogen: 604 pg/mL

## 2015-01-23 MED ORDER — VITAMIN D (ERGOCALCIFEROL) 1.25 MG (50000 UNIT) PO CAPS: 50000.0000 [IU] | ORAL_CAPSULE | ORAL | Status: DC

## 2015-01-23 NOTE — Addendum Note (Signed)
Addended by: Silverio Decamp on: 01/23/2015 09:48 PM   Modules accepted: Orders

## 2015-02-13 ENCOUNTER — Ambulatory Visit (INDEPENDENT_AMBULATORY_CARE_PROVIDER_SITE_OTHER): Payer: 59 | Admitting: Sports Medicine

## 2015-02-13 ENCOUNTER — Encounter: Payer: Self-pay | Admitting: Sports Medicine

## 2015-02-13 VITALS — BP 146/88 | HR 77 | Ht 65.0 in | Wt 223.0 lb

## 2015-02-13 DIAGNOSIS — E282 Polycystic ovarian syndrome: Secondary | ICD-10-CM | POA: Diagnosis not present

## 2015-02-13 DIAGNOSIS — E669 Obesity, unspecified: Secondary | ICD-10-CM

## 2015-02-13 MED ORDER — PHENTERMINE HCL 37.5 MG PO TABS: ORAL_TABLET | ORAL | Status: DC

## 2015-02-13 NOTE — Progress Notes (Signed)
  Subjective:    CC: Weight check  HPI: Moderate weight loss after the first month, doing well. No side effects.  Past medical history, Surgical history, Family history not pertinant except as noted below, Social history, Allergies, and medications have been entered into the medical record, reviewed, and no changes needed.   Review of Systems: No fevers, chills, night sweats, weight loss, chest pain, or shortness of breath.   Objective:    General: Well Developed, well nourished, and in no acute distress.  Neuro: Alert and oriented x3, extra-ocular muscles intact, sensation grossly intact.  HEENT: Normocephalic, atraumatic, pupils equal round reactive to light, neck supple, no masses, no lymphadenopathy, thyroid nonpalpable.  Skin: Warm and dry, no rashes. Cardiac: Regular rate and rhythm, no murmurs rubs or gallops, no lower extremity edema.  Respiratory: Clear to auscultation bilaterally. Not using accessory muscles, speaking in full sentences.  Impression and Recommendations:

## 2015-02-13 NOTE — Assessment & Plan Note (Signed)
With mildly high testosterone levels and irregular menstruation. Patient has not started pregnant, and no further treatment is necessary. She did have an ultrasound in the past that showed polycystic changes.

## 2015-02-13 NOTE — Assessment & Plan Note (Signed)
Small amount of weight loss. Next line refilling phentermine and switching to one half tab twice a day. We do need to keep an eye on her blood pressure.

## 2015-03-13 ENCOUNTER — Encounter: Payer: Self-pay | Admitting: Sports Medicine

## 2015-03-13 ENCOUNTER — Ambulatory Visit (INDEPENDENT_AMBULATORY_CARE_PROVIDER_SITE_OTHER): Payer: 59 | Admitting: Sports Medicine

## 2015-03-13 VITALS — BP 132/87 | HR 85 | Ht 65.0 in | Wt 223.0 lb

## 2015-03-13 DIAGNOSIS — E669 Obesity, unspecified: Secondary | ICD-10-CM | POA: Diagnosis not present

## 2015-03-13 DIAGNOSIS — M79673 Pain in unspecified foot: Secondary | ICD-10-CM | POA: Diagnosis not present

## 2015-03-13 MED ORDER — LIRAGLUTIDE -WEIGHT MANAGEMENT 18 MG/3ML ~~LOC~~ SOPN: 3.0000 mg | PEN_INJECTOR | Freq: Every day | SUBCUTANEOUS | Status: DC

## 2015-03-13 NOTE — Progress Notes (Signed)
  Subjective:    CC:  Follow-up  HPI: Obesity: Patient did not lose weight after 2 months of phentermine, amenable to trying a different mechanism for weight loss.   Arch pain: Bilateral, moderate, persistent without radiation. Worse when wearing flats.  Past medical history, Surgical history, Family history not pertinant except as noted below, Social history, Allergies, and medications have been entered into the medical record, reviewed, and no changes needed.   Review of Systems: No fevers, chills, night sweats, weight loss, chest pain, or shortness of breath.   Objective:    General: Well Developed, well nourished, and in no acute distress.  Neuro: Alert and oriented x3, extra-ocular muscles intact, sensation grossly intact.  HEENT: Normocephalic, atraumatic, pupils equal round reactive to light, neck supple, no masses, no lymphadenopathy, thyroid nonpalpable.  Skin: Warm and dry, no rashes. Cardiac: Regular rate and rhythm, no murmurs rubs or gallops, no lower extremity edema.  Respiratory: Clear to auscultation bilaterally. Not using accessory muscles, speaking in full sentences.  Impression and Recommendations:

## 2015-03-13 NOTE — Assessment & Plan Note (Signed)
Failure of diet, exercise, and phentermine. Starting Saxenda. Return in one month for a weight check. Discount coupon given.

## 2015-03-13 NOTE — Assessment & Plan Note (Signed)
Return for custom orthotics, patient advised to bring her flats, and tennis shoes.

## 2015-04-10 ENCOUNTER — Ambulatory Visit: Payer: 59 | Admitting: Sports Medicine

## 2015-04-17 ENCOUNTER — Encounter: Payer: 59 | Admitting: Sports Medicine

## 2015-07-06 ENCOUNTER — Encounter: Payer: Self-pay | Admitting: Sports Medicine

## 2015-07-06 ENCOUNTER — Ambulatory Visit (INDEPENDENT_AMBULATORY_CARE_PROVIDER_SITE_OTHER): Payer: BLUE CROSS/BLUE SHIELD | Admitting: Sports Medicine

## 2015-07-06 VITALS — BP 154/91 | HR 95 | Ht 65.0 in | Wt 219.0 lb

## 2015-07-06 DIAGNOSIS — N3090 Cystitis, unspecified without hematuria: Secondary | ICD-10-CM | POA: Insufficient documentation

## 2015-07-06 DIAGNOSIS — E669 Obesity, unspecified: Secondary | ICD-10-CM

## 2015-07-06 DIAGNOSIS — R350 Frequency of micturition: Secondary | ICD-10-CM | POA: Diagnosis not present

## 2015-07-06 LAB — POCT URINALYSIS DIPSTICK
Bilirubin, UA: NEGATIVE
Glucose, UA: NEGATIVE
Ketones, UA: NEGATIVE
Nitrite, UA: POSITIVE
Protein, UA: NEGATIVE
Spec Grav, UA: 1.015
Urobilinogen, UA: 4
pH, UA: 7

## 2015-07-06 MED ORDER — CEPHALEXIN 500 MG PO CAPS: 500.0000 mg | ORAL_CAPSULE | Freq: Two times a day (BID) | ORAL | Status: DC

## 2015-07-06 MED ORDER — PHENAZOPYRIDINE HCL 200 MG PO TABS: 200.0000 mg | ORAL_TABLET | Freq: Three times a day (TID) | ORAL | Status: AC

## 2015-07-06 MED ORDER — ONDANSETRON 8 MG PO TBDP: 8.0000 mg | ORAL_TABLET | Freq: Three times a day (TID) | ORAL | Status: DC | PRN

## 2015-07-06 MED ORDER — FLUCONAZOLE 150 MG PO TABS: 150.0000 mg | ORAL_TABLET | Freq: Once | ORAL | Status: DC

## 2015-07-06 NOTE — Assessment & Plan Note (Signed)
Dysuria with a positive urinalysis withleukocytes, blood, nitrites. Previous urine culture grew out pansensitive Escherichia coli, we will culture again and treat with Pyridium and Keflex.

## 2015-07-06 NOTE — Assessment & Plan Note (Signed)
Has not yet obtained Saxenda, she will get the Saxenda syringes and return for nurse visit to learn injections.

## 2015-07-06 NOTE — Progress Notes (Signed)
  Subjective:    CC: urinary tract infection  HPI: This is a pleasant 44 year old female, for the past several days she's had increasing dysuria, persistent, localized suprapubic without any radiation, no costovertebral angle pain and no constitutional symptoms. Prior urinary tract infection has grown out pansensitive Escherichia coli.  Obesity: Never picked up her Saxenda.  Elevated blood pressure: Intermittent, no headaches, visual changes, chest pain.  Past medical history, Surgical history, Family history not pertinant except as noted below, Social history, Allergies, and medications have been entered into the medical record, reviewed, and no changes needed.   Review of Systems: No fevers, chills, night sweats, weight loss, chest pain, or shortness of breath.   Objective:    General: Well Developed, well nourished, and in no acute distress.  Neuro: Alert and oriented x3, extra-ocular muscles intact, sensation grossly intact.  HEENT: Normocephalic, atraumatic, pupils equal round reactive to light, neck supple, no masses, no lymphadenopathy, thyroid nonpalpable.  Skin: Warm and dry, no rashes. Cardiac: Regular rate and rhythm, no murmurs rubs or gallops, no lower extremity edema.  Respiratory: Clear to auscultation bilaterally. Not using accessory muscles, speaking in full sentences. Abdomen: Soft, nontender, not distended, no bowel sounds, palpable masses. No costovertebral pain.  Urinalysis is positive for leukocytes, nitrites and blood.  Impression and Recommendations:   I spent 25 minutes with this patient, greater than 50% was face-to-face time counseling regarding the above diagnoses

## 2015-07-09 LAB — CULTURE, URINE COMPREHENSIVE: Colony Count: >=100000

## 2015-08-03 ENCOUNTER — Ambulatory Visit: Payer: BLUE CROSS/BLUE SHIELD | Admitting: Sports Medicine

## 2017-03-14 DIAGNOSIS — R739 Hyperglycemia, unspecified: Secondary | ICD-10-CM | POA: Diagnosis not present

## 2017-03-14 DIAGNOSIS — R7309 Other abnormal glucose: Secondary | ICD-10-CM | POA: Diagnosis not present

## 2017-03-14 DIAGNOSIS — F419 Anxiety disorder, unspecified: Secondary | ICD-10-CM | POA: Diagnosis not present

## 2017-03-21 DIAGNOSIS — H524 Presbyopia: Secondary | ICD-10-CM | POA: Diagnosis not present

## 2017-08-28 DIAGNOSIS — R51 Headache: Secondary | ICD-10-CM | POA: Diagnosis not present

## 2017-08-28 DIAGNOSIS — I10 Essential (primary) hypertension: Secondary | ICD-10-CM | POA: Diagnosis not present

## 2017-10-16 DIAGNOSIS — N730 Acute parametritis and pelvic cellulitis: Secondary | ICD-10-CM | POA: Diagnosis not present

## 2017-10-16 DIAGNOSIS — I10 Essential (primary) hypertension: Secondary | ICD-10-CM | POA: Diagnosis not present

## 2017-10-16 DIAGNOSIS — R509 Fever, unspecified: Secondary | ICD-10-CM | POA: Diagnosis not present

## 2017-10-16 DIAGNOSIS — R1032 Left lower quadrant pain: Secondary | ICD-10-CM | POA: Diagnosis not present

## 2017-10-19 DIAGNOSIS — R1032 Left lower quadrant pain: Secondary | ICD-10-CM | POA: Diagnosis not present

## 2017-10-19 DIAGNOSIS — R102 Pelvic and perineal pain: Secondary | ICD-10-CM | POA: Diagnosis not present

## 2017-10-19 DIAGNOSIS — Z3202 Encounter for pregnancy test, result negative: Secondary | ICD-10-CM | POA: Diagnosis not present

## 2017-10-19 DIAGNOSIS — N83202 Unspecified ovarian cyst, left side: Secondary | ICD-10-CM | POA: Diagnosis not present

## 2017-10-23 DIAGNOSIS — Z6837 Body mass index (BMI) 37.0-37.9, adult: Secondary | ICD-10-CM | POA: Diagnosis not present

## 2017-10-23 DIAGNOSIS — Z1231 Encounter for screening mammogram for malignant neoplasm of breast: Secondary | ICD-10-CM | POA: Diagnosis not present

## 2017-10-23 DIAGNOSIS — Z01419 Encounter for gynecological examination (general) (routine) without abnormal findings: Secondary | ICD-10-CM | POA: Diagnosis not present

## 2017-12-09 ENCOUNTER — Encounter: Payer: Self-pay | Admitting: Emergency Medicine

## 2017-12-09 ENCOUNTER — Other Ambulatory Visit: Payer: Self-pay

## 2017-12-09 ENCOUNTER — Emergency Department (INDEPENDENT_AMBULATORY_CARE_PROVIDER_SITE_OTHER): Payer: BLUE CROSS/BLUE SHIELD

## 2017-12-09 ENCOUNTER — Emergency Department (INDEPENDENT_AMBULATORY_CARE_PROVIDER_SITE_OTHER)
Admission: EM | Admit: 2017-12-09 | Discharge: 2017-12-09 | Disposition: A | Discharge status: Home / Self Care | Payer: Self-pay | Source: Home / Self Care | Attending: Family Medicine | Admitting: Family Medicine

## 2017-12-09 DIAGNOSIS — S161XXA Strain of muscle, fascia and tendon at neck level, initial encounter: Secondary | ICD-10-CM

## 2017-12-09 DIAGNOSIS — M2578 Osteophyte, vertebrae: Secondary | ICD-10-CM | POA: Diagnosis not present

## 2017-12-09 DIAGNOSIS — S39012A Strain of muscle, fascia and tendon of lower back, initial encounter: Secondary | ICD-10-CM

## 2017-12-09 DIAGNOSIS — M542 Cervicalgia: Secondary | ICD-10-CM | POA: Diagnosis not present

## 2017-12-09 HISTORY — DX: Anxiety disorder, unspecified: F41.9

## 2017-12-09 MED ORDER — CYCLOBENZAPRINE HCL 10 MG PO TABS: 10.0000 mg | ORAL_TABLET | Freq: Two times a day (BID) | ORAL | 0 refills | Status: DC | PRN

## 2017-12-09 NOTE — Discharge Instructions (Signed)
Apply ice pack for 20 to 30 minutes, 3 to 4 times daily  Continue until pain and swelling decrease.  May take Ibuprofen 258m, 4 tabs every 8 hours with food.  Begin range of motion and stretching exercises as tolerated.
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
## 2017-12-21 ENCOUNTER — Encounter: Payer: Self-pay | Admitting: *Deleted

## 2017-12-21 ENCOUNTER — Ambulatory Visit (HOSPITAL_BASED_OUTPATIENT_CLINIC_OR_DEPARTMENT_OTHER)
Admission: RE | Admit: 2017-12-21 | Discharge: 2017-12-21 | Disposition: A | Discharge status: Home / Self Care | Payer: BLUE CROSS/BLUE SHIELD | Source: Ambulatory Visit | Attending: Emergency Medicine | Admitting: Emergency Medicine

## 2017-12-21 ENCOUNTER — Emergency Department (INDEPENDENT_AMBULATORY_CARE_PROVIDER_SITE_OTHER): Payer: BLUE CROSS/BLUE SHIELD

## 2017-12-21 ENCOUNTER — Emergency Department
Admission: EM | Admit: 2017-12-21 | Discharge: 2017-12-21 | Disposition: A | Discharge status: Home / Self Care | Payer: BLUE CROSS/BLUE SHIELD | Source: Home / Self Care | Attending: Emergency Medicine | Admitting: Emergency Medicine

## 2017-12-21 ENCOUNTER — Telehealth: Payer: Self-pay | Admitting: Emergency Medicine

## 2017-12-21 DIAGNOSIS — K2970 Gastritis, unspecified, without bleeding: Secondary | ICD-10-CM

## 2017-12-21 DIAGNOSIS — K828 Other specified diseases of gallbladder: Secondary | ICD-10-CM | POA: Insufficient documentation

## 2017-12-21 DIAGNOSIS — K760 Fatty (change of) liver, not elsewhere classified: Secondary | ICD-10-CM | POA: Diagnosis not present

## 2017-12-21 DIAGNOSIS — R1012 Left upper quadrant pain: Secondary | ICD-10-CM | POA: Diagnosis not present

## 2017-12-21 DIAGNOSIS — R110 Nausea: Secondary | ICD-10-CM | POA: Diagnosis not present

## 2017-12-21 DIAGNOSIS — R1013 Epigastric pain: Secondary | ICD-10-CM | POA: Insufficient documentation

## 2017-12-21 DIAGNOSIS — R1011 Right upper quadrant pain: Secondary | ICD-10-CM | POA: Insufficient documentation

## 2017-12-21 DIAGNOSIS — K838 Other specified diseases of biliary tract: Secondary | ICD-10-CM | POA: Diagnosis not present

## 2017-12-21 DIAGNOSIS — R1010 Upper abdominal pain, unspecified: Secondary | ICD-10-CM

## 2017-12-21 DIAGNOSIS — K2990 Gastroduodenitis, unspecified, without bleeding: Secondary | ICD-10-CM

## 2017-12-21 LAB — POCT URINALYSIS DIP (MANUAL ENTRY)
Bilirubin, UA: NEGATIVE
Blood, UA: NEGATIVE
GLUCOSE UA: NEGATIVE mg/dL
Ketones, POC UA: NEGATIVE mg/dL
LEUKOCYTES UA: NEGATIVE
Nitrite, UA: NEGATIVE
SPEC GRAV UA: 1.02 (ref 1.010–1.025)
Urobilinogen, UA: 2 E.U./dL — AB
pH, UA: 7 (ref 5.0–8.0)

## 2017-12-21 LAB — POCT CBC W AUTO DIFF (K'VILLE URGENT CARE)

## 2017-12-21 MED ORDER — GI COCKTAIL ~~LOC~~
30.0000 mL | Freq: Once | ORAL | Status: AC
  Administered 2017-12-21: 30 mL via ORAL

## 2017-12-21 MED ORDER — ONDANSETRON 8 MG PO TBDP: ORAL_TABLET | ORAL | 0 refills | Status: DC

## 2017-12-21 MED ORDER — OMEPRAZOLE 40 MG PO CPDR: 40.0000 mg | DELAYED_RELEASE_CAPSULE | Freq: Every day | ORAL | 0 refills | Status: DC

## 2017-12-21 NOTE — ED Provider Notes (Signed)
[name]    CSN: [id number] Arrival date & time: 12/21/17  1400     History   Chief Complaint Chief Complaint  Patient presents with  . Abdominal Pain    HPI Heather Salazar is a 47 y.o. female.  Patient was in her usual state of health until Friday evening when she was having some mild nausea.  On Saturday she started having some mid epigastric abdominal pain associated with abdominal cramping.  While having this pain she had one episode of vomiting.  She has a history of polycystic disease and started taking naproxen because of her abdominal pain.  She she has had discomfort off and on since that time.  She describes her pain as a sharp upper abdominal pain somewhat to the right.  She has had one episode of vomiting today.  She does feel like if she eats this makes her discomfort worse.  Abdominal Pain  Associated symptoms: nausea and vomiting   Associated symptoms: no constipation and no diarrhea     Past Medical History:  Diagnosis Date  . Anxiety   . Asthma    childhood    Patient Active Problem List   Diagnosis Date Noted  . Cystitis 07/06/2015  . Arch pain 03/13/2015  . Annual physical exam 01/16/2015  . Polycystic ovarian syndrome 01/16/2015  . Obesity 01/16/2015    Past Surgical History:  Procedure Laterality Date  . CESAREAN SECTION      OB History    No data available       Home Medications    Prior to Admission medications   Medication Sig Start Date End Date Taking? Authorizing Provider  albuterol (PROVENTIL HFA;VENTOLIN HFA) 108 (90 BASE) MCG/ACT inhaler Inhale 2 puffs into the lungs every 4 (four) hours as needed for wheezing or shortness of breath. 08/17/14   Jacqulyn Cane, MD  cyclobenzaprine (FLEXERIL) 10 MG tablet Take 1 tablet (10 mg total) by mouth 2 (two) times daily as needed for muscle spasms. 12/09/17   Kandra Nicolas, MD  diclofenac (VOLTAREN) 75 MG EC tablet Take 75 mg by mouth 2 (two) times daily.    [provider]  Liraglutide -Weight Management (SAXENDA) 18 MG/3ML SOPN Inject 3 mg into the skin daily. 0.6 mg injected subcutaneously daily for 1 week, then increase by 0.6 mg weekly until reaching 3 mg injected subcutaneous daily 03/13/15   Silverio Decamp, MD  LORazepam (ATIVAN) 0.5 MG tablet Take 0.5 mg by mouth every 6 (six) hours as needed for anxiety.    [provider]  ondansetron (ZOFRAN-ODT) 8 MG disintegrating tablet Take 1 tablet (8 mg total) by mouth every 8 (eight) hours as needed for nausea. 07/06/15   Silverio Decamp, MD  Vitamin D, Ergocalciferol, (DRISDOL) 50000 UNITS CAPS capsule Take 1 capsule (50,000 Units total) by mouth every 7 (seven) days. 01/23/15   Silverio Decamp, MD    Family History Family History  Problem Relation Age of Onset  . Asthma Mother   . Heart failure Father   . Asthma Sister   . Asthma Brother   . Diabetes Neg Hx     Social History Social History   Tobacco Use  . Smoking status: Current Every Day Smoker    Packs/day: 0.50  . Smokeless tobacco: Never Used  Substance Use Topics  . Alcohol use: Yes  . Drug use: No     Allergies   Acetaminophen-codeine; Iodine solution [povidone iodine]; and Oxycodone   Review of  Systems Review of Systems  Constitutional: Negative.   HENT: Negative.   Eyes: Negative.   Respiratory: Negative.   Cardiovascular: Negative.   Gastrointestinal: Positive for abdominal pain, nausea and vomiting. Negative for constipation and diarrhea.  Genitourinary:       She is status post tubal ligation, status post exploratory laparotomy.  She has a history of polycystic ovaries.  Musculoskeletal: Negative.   Neurological: Negative.      Physical Exam Triage Vital Signs ED Triage Vitals [12/21/17 1433]  Enc Vitals Group     BP (!) 155/109     Pulse Rate 84     Resp 14     Temp 98.1 F (36.7 C)     Temp Source Oral     SpO2 100 %     Weight 230 lb (104.3 kg)     Height      Head  Circumference      Peak Flow      Pain Score 6     Pain Loc      Pain Edu?      Excl. in [place]?    No data found.  Updated Vital Signs BP (!) 155/109 (BP Location: Right Arm)   Pulse 84   Temp 98.1 F (36.7 C) (Oral)   Resp 14   Wt 230 lb (104.3 kg)   LMP 11/23/2017 (Exact Date)   SpO2 100%   BMI 38.27 kg/m   Visual Acuity Right Eye Distance:   Left Eye Distance:   Bilateral Distance:    Right Eye Near:   Left Eye Near:    Bilateral Near:     Physical Exam  Constitutional: She appears well-developed and well-nourished.  HENT:  Head: Normocephalic.  Mouth/Throat: Oropharynx is clear and moist.  Eyes: Pupils are equal, round, and reactive to light.  Cardiovascular: Normal rate and regular rhythm.  Pulmonary/Chest: Effort normal and breath sounds normal.  Abdominal:  The abdomen is not distended.  Bowel sounds are normal.  There is a healed periumbilical scar.  There is tenderness to deep palpation in the midepigastrium.  There is no rebound noted.     UC Treatments / Results  Labs (all labs ordered are listed, but only abnormal results are displayed) Labs Reviewed - No data to display  EKG  EKG Interpretation None       Radiology No results found.  Procedures Procedures (including critical care time)  Medications Ordered in UC Medications  gi cocktail (Maalox,Lidocaine,Donnatal) (not administered)     Initial Impression / Assessment and Plan / UC Course  I have reviewed the triage vital signs and the nursing notes.  Pertinent labs & imaging results that were available during my care of the patient were reviewed by me and considered in my medical decision making (see chart for details).  Clinical Course as of Dec 22 1627  Thu Dec 21, 2017  1509 BP: (!) 155/109 [SD]    Clinical Course User Index [SD] Darlyne Russian, MD      Final Clinical Impressions(s) / UC Diagnoses   Final diagnoses:  None    ED Discharge Orders    None     Be on  a bland diet. They will call and schedule your ultrasound. Take medication for nausea as needed. Take omeprazole every day. Do not take any more naproxen.  Controlled Substance Prescriptions [place] Controlled Substance Registry consulted? Not Applicable   Darlyne Russian, MD 12/21/17 1630

## 2017-12-21 NOTE — Discharge Instructions (Signed)
Be on a bland diet. They will call and schedule your ultrasound. Take medication for nausea as needed. Take omeprazole every day. Do not take any more naproxen. Follow-up blood pressure with Dr. Rosana Hoes.

## 2017-12-21 NOTE — ED Triage Notes (Signed)
Patient c/o 6 days of epigastric pain with nausea. She vomited on day 1 and today. Taken Naproxen.

## 2017-12-21 NOTE — Telephone Encounter (Signed)
I called and spoke with patient.  She does have gallbladder sludge.  I told her to take the medications I prescribed and discussed with Dr. Darene Lamer next week regarding further management of her abnormal gallbladder ultrasound

## 2017-12-22 ENCOUNTER — Telehealth: Payer: Self-pay | Admitting: Emergency Medicine

## 2017-12-22 LAB — COMPLETE METABOLIC PANEL WITH GFR
AG Ratio: 1.3 (calc) (ref 1.0–2.5)
ALBUMIN MSPROF: 4 g/dL (ref 3.6–5.1)
ALKALINE PHOSPHATASE (APISO): 80 U/L (ref 33–115)
ALT: 25 U/L (ref 6–29)
AST: 23 U/L (ref 10–35)
BILIRUBIN TOTAL: 0.8 mg/dL (ref 0.2–1.2)
BUN: 9 mg/dL (ref 7–25)
CHLORIDE: 103 mmol/L (ref 98–110)
CO2: 27 mmol/L (ref 20–32)
Calcium: 8.9 mg/dL (ref 8.6–10.2)
Creat: 0.82 mg/dL (ref 0.50–1.10)
GFR, Est African American: 100 mL/min/{1.73_m2} (ref >=60)
GFR, Est Non African American: 86 mL/min/{1.73_m2} (ref >=60)
Globulin: 3 g/dL (calc) (ref 1.9–3.7)
Glucose, Bld: 86 mg/dL (ref 65–99)
Potassium: 4.1 mmol/L (ref 3.5–5.3)
Sodium: 142 mmol/L (ref 135–146)
Total Protein: 7 g/dL (ref 6.1–8.1)

## 2017-12-22 LAB — AMYLASE: AMYLASE: 47 U/L (ref 21–101)

## 2017-12-22 LAB — LIPASE: LIPASE: 14 U/L (ref 7–60)

## 2017-12-28 ENCOUNTER — Ambulatory Visit: Payer: BLUE CROSS/BLUE SHIELD | Admitting: Sports Medicine

## 2017-12-28 ENCOUNTER — Encounter: Payer: Self-pay | Admitting: Sports Medicine

## 2017-12-28 ENCOUNTER — Ambulatory Visit (INDEPENDENT_AMBULATORY_CARE_PROVIDER_SITE_OTHER): Payer: BLUE CROSS/BLUE SHIELD

## 2017-12-28 DIAGNOSIS — M542 Cervicalgia: Secondary | ICD-10-CM

## 2017-12-28 DIAGNOSIS — S299XXA Unspecified injury of thorax, initial encounter: Secondary | ICD-10-CM | POA: Diagnosis not present

## 2017-12-28 DIAGNOSIS — M545 Low back pain: Secondary | ICD-10-CM | POA: Diagnosis not present

## 2017-12-28 DIAGNOSIS — K828 Other specified diseases of gallbladder: Secondary | ICD-10-CM | POA: Diagnosis not present

## 2017-12-28 DIAGNOSIS — M549 Dorsalgia, unspecified: Secondary | ICD-10-CM | POA: Diagnosis not present

## 2017-12-28 DIAGNOSIS — K8050 Calculus of bile duct without cholangitis or cholecystitis without obstruction: Secondary | ICD-10-CM | POA: Insufficient documentation

## 2017-12-28 MED ORDER — PREDNISONE 50 MG PO TABS
ORAL_TABLET | ORAL | 0 refills | Status: DC
Start: 2017-12-28 — End: 2018-01-11

## 2017-12-28 MED ORDER — MELOXICAM 15 MG PO TABS: ORAL_TABLET | ORAL | 3 refills | Status: DC

## 2017-12-28 NOTE — Assessment & Plan Note (Signed)
This can be seen incidentally. We will work this up at a future visit.
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
## 2018-01-01 ENCOUNTER — Ambulatory Visit: Payer: BLUE CROSS/BLUE SHIELD | Admitting: Sports Medicine

## 2018-01-01 DIAGNOSIS — I10 Essential (primary) hypertension: Secondary | ICD-10-CM

## 2018-01-01 DIAGNOSIS — R1011 Right upper quadrant pain: Secondary | ICD-10-CM

## 2018-01-01 DIAGNOSIS — K8050 Calculus of bile duct without cholangitis or cholecystitis without obstruction: Secondary | ICD-10-CM

## 2018-01-01 MED ORDER — VALSARTAN-HYDROCHLOROTHIAZIDE 320-25 MG PO TABS: 0.5000 | ORAL_TABLET | Freq: Every day | ORAL | 3 refills | Status: DC

## 2018-01-01 NOTE — Progress Notes (Signed)
Subjective:    CC: Abdominal pain  HPI: This is a pleasant 47 year old female, for several months she is noted increasin discomfort in her upper abdomen, she was evaluated and an ultrasound showed some gallbladder sludge.  The pain is intermittent, colicky, she cannot link to any foods, occasional yellowish emesis but no hematemesis, no melena, no hematochezia.  Not worse with spicy foods, does not notice any worsening with NSAIDs.  Elevated blood pressure: In addition has noted blood pressure elevated over several months, both at home and at [place], no headaches, visual changes, chest pain.  I reviewed the past medical history, family history, social history, surgical history, and allergies today and no changes were needed.  Please see the problem list section below in epic for further details.  Past Medical History: Past Medical History:  Diagnosis Date  . Anxiety   . Asthma    childhood   Past Surgical History: Past Surgical History:  Procedure Laterality Date  . CESAREAN SECTION     Social History: Social History   Socioeconomic History  . Marital status: Divorced    Spouse name: Not on file  . Number of children: Not on file  . Years of education: Not on file  . Highest education level: Not on file  Occupational History  . Not on file  Social Needs  . Financial resource strain: Not on file  . Food insecurity:    Worry: Not on file    Inability: Not on file  . Transportation needs:    Medical: Not on file    Non-medical: Not on file  Tobacco Use  . Smoking status: Current Every Day Smoker    Packs/day: 0.50  . Smokeless tobacco: Never Used  Substance and Sexual Activity  . Alcohol use: Yes  . Drug use: No  . Sexual activity: Not on file  Lifestyle  . Physical activity:    Days per week: Not on file    Minutes per session: Not on file  . Stress: Not on file  Relationships  . Social connections:    Talks on phone: Not on file    Gets together:  Not on file    Attends religious service: Not on file    Active member of club or organization: Not on file    Attends meetings of clubs or organizations: Not on file    Relationship status: Not on file  Other Topics Concern  . Not on file  Social History Narrative  . Not on file   Family History: Family History  Problem Relation Age of Onset  . Asthma Mother   . Heart failure Father   . Asthma Sister   . Asthma Brother   . Diabetes Neg Hx    Allergies: Allergies  Allergen Reactions  . Acetaminophen-Codeine     sob  . Iodine Solution [Povidone Iodine]     rash  . Oxycodone Itching   Medications: See med rec.  Review of Systems: No fevers, chills, night sweats, weight loss, chest pain, or shortness of breath.   Objective:    General: Well Developed, well nourished, and in no acute distress.  Neuro: Alert and oriented x3, extra-ocular muscles intact, sensation grossly intact.  HEENT: Normocephalic, atraumatic, pupils equal round reactive to light, neck supple, no masses, no lymphadenopathy, thyroid nonpalpable.  Skin: Warm and dry, no rashes. Cardiac: Regular rate and rhythm, no murmurs rubs or gallops, no lower extremity edema.  Respiratory: Clear to auscultation bilaterally. Not using accessory muscles,  speaking in full sentences. Abdomen: Soft, nontender, nondistended, no bowel sounds, no palpable masses, no guarding, rigidity, rebound tenderness.  Impression and Recommendations:    Biliary colic Suspect biliary colic versus gastritis. Ultrasound did show some gallbladder sludge, adding a HIDA scan for confirmation and referral to general surgery.   Benign essential hypertension Starting valsartan/HCTZ one half tab. 2-week follow-up and a nurse visit for a blood pressure check. We will also need to recheck BUN and creatinine at that time, I will catch her up on some of her other blood work as well. ___________________________________________ Gwen Her.  [name], M.D., ABFM., CAQSM. Primary Care and [place] Instructor of [place] of [facility] of Medicine

## 2018-01-01 NOTE — Assessment & Plan Note (Signed)
Starting valsartan/HCTZ one half tab. 2-week follow-up and a nurse visit for a blood pressure check. We will also need to recheck BUN and creatinine at that time, I will catch her up on some of her other blood work as well.

## 2018-01-01 NOTE — Assessment & Plan Note (Signed)
Suspect biliary colic versus gastritis. Ultrasound did show some gallbladder sludge, adding a HIDA scan for confirmation and referral to general surgery.

## 2018-01-11 ENCOUNTER — Ambulatory Visit: Payer: BLUE CROSS/BLUE SHIELD | Admitting: Sports Medicine

## 2018-01-11 ENCOUNTER — Encounter: Payer: Self-pay | Admitting: Sports Medicine

## 2018-01-11 DIAGNOSIS — M542 Cervicalgia: Secondary | ICD-10-CM | POA: Diagnosis not present
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
## 2018-01-12 ENCOUNTER — Encounter (HOSPITAL_COMMUNITY)
Admission: RE | Admit: 2018-01-12 | Discharge: 2018-01-12 | Disposition: A | Discharge status: Home / Self Care | Payer: BLUE CROSS/BLUE SHIELD | Source: Ambulatory Visit | Attending: Sports Medicine | Admitting: Sports Medicine

## 2018-01-12 DIAGNOSIS — K8050 Calculus of bile duct without cholangitis or cholecystitis without obstruction: Secondary | ICD-10-CM | POA: Insufficient documentation

## 2018-01-12 DIAGNOSIS — R1011 Right upper quadrant pain: Secondary | ICD-10-CM | POA: Insufficient documentation

## 2018-01-12 DIAGNOSIS — R112 Nausea with vomiting, unspecified: Secondary | ICD-10-CM | POA: Diagnosis not present

## 2018-01-12 MED ORDER — TECHNETIUM TC 99M MEBROFENIN IV KIT
5.3000 | PACK | Freq: Once | INTRAVENOUS | Status: AC | PRN
  Administered 2018-01-12: 5.3 via INTRAVENOUS

## 2018-01-15 ENCOUNTER — Ambulatory Visit (INDEPENDENT_AMBULATORY_CARE_PROVIDER_SITE_OTHER): Payer: BLUE CROSS/BLUE SHIELD | Admitting: Sports Medicine

## 2018-01-15 VITALS — BP 134/78 | HR 91

## 2018-01-15 DIAGNOSIS — I10 Essential (primary) hypertension: Secondary | ICD-10-CM | POA: Diagnosis not present

## 2018-01-15 LAB — COMPLETE METABOLIC PANEL WITH GFR
AG Ratio: 1.2 (calc) (ref 1.0–2.5)
ALT: 32 U/L — ABNORMAL HIGH (ref 6–29)
AST: 33 U/L (ref 10–35)
Alkaline phosphatase (APISO): 103 U/L (ref 33–115)
BUN: 12 mg/dL (ref 7–25)
CO2: 23 mmol/L (ref 20–32)
Calcium: 9.7 mg/dL (ref 8.6–10.2)
GFR, Est African American: 82 mL/min/{1.73_m2} (ref >=60)
GFR, Est Non African American: 71 mL/min/{1.73_m2} (ref >=60)
Glucose, Bld: 94 mg/dL (ref 65–99)
Potassium: 4.1 mmol/L (ref 3.5–5.3)
Total Protein: 7.4 g/dL (ref 6.1–8.1)

## 2018-01-15 LAB — COMPLETE METABOLIC PANEL WITHOUT GFR
Albumin: 4.1 g/dL (ref 3.6–5.1)
Chloride: 101 mmol/L (ref 98–110)
Creat: 0.97 mg/dL (ref 0.50–1.10)
Globulin: 3.3 g/dL (ref 1.9–3.7)
Sodium: 135 mmol/L (ref 135–146)
Total Bilirubin: 0.7 mg/dL (ref 0.2–1.2)

## 2018-01-15 NOTE — Progress Notes (Signed)
Pt came into clinic today for BP recheck. At last OV she was started on valsartan/HCTZ one half tab. Pt reports she is tolerating this Rx well, but she did have one spell of dizziness. Pt reports she hadn't been drinking enough water that day. No further concerns. Pt also needs to get her BUN & Creatinine rechecked, lab order placed. Went over NM results while in office, verbalized understanding. She has an appt with General Surgery this week already. No further questions.

## 2018-01-15 NOTE — Assessment & Plan Note (Signed)
Well-controlled on current regimen, no changes in plan.

## 2018-01-18 ENCOUNTER — Ambulatory Visit: Payer: Self-pay | Admitting: Surgery

## 2018-01-18 DIAGNOSIS — K828 Other specified diseases of gallbladder: Secondary | ICD-10-CM | POA: Diagnosis not present

## 2018-01-18 NOTE — H&P (Signed)
  [name] Documented: 01/18/2018 9:19 AM Location: [facility] Patient #: [id number] DOB: October 08, 1971 Single / Language: Heather Salazar / Race: Black or African American Female  History of Present Illness ([place] Searles A. Kae Heller MD; 01/18/2018 9:27 AM) Patient words: 47 year old woman with a history of obesity, hypertension, anxiety, asthma and she was referred by Dr. [name] for biliary dyskinesia. She presented to the emergency room in mid March with a week of intermittent nausea, epigastric pain and occasional vomiting. The pain was sharp, upper abdominal and somewhat to the right. Eating exacerbates the discomfort. She was thought to have gastritis and was advised to stop taking the naproxen that she had been using regularly for her PCO symptoms, was advised to take omeprazole every day as well as nausea medication as needed. She was referred for an ultrasound which did show sludge in the gallbladder, common bile duct 2 mm, diffuse hepatic steatosis. Subsequently HIDA scan showed abnormally low ejection fraction of 25.7% consistent with biliary dyskinesia. She had a CMP on April 8 that was unremarkable. She has stopped taking NSAIDs as advised. She does continue to have the discomfort.  Surgical history notable for C-section. She also had a diagnostic laparoscopy for infertility.  She smokes half pack a day. She works at [employer] which is a very stressful job. She has 2 kids, ages 12 and 18, one of whom is graduating college on May 3 and then coming here to [place] for postgraduate work..   Review of Systems (Zell Doucette A. Kae Heller MD; 01/18/2018 9:29 AM) All other systems negative   Physical Exam (Fauna Neuner A. Kae Heller MD; 01/18/2018 9:28 AM)  The physical exam findings are as follows: Note:Gen: alert and well appearing Eye: extraocular motion intact, no scleral icterus ENT: moist mucus membranes, dentition intact Neck: no mass or thyromegaly Chest:  unlabored respirations, symmetrical air entry, clear bilaterally CV: regular rate and rhythm, no pedal edema Abdomen: soft, very minimally subjectively tender along the epigastrium and right subcostal margin, nondistended. No mass or organomegaly. Well-healed infraumbilical scar. MSK: strength symmetrical throughout, no deformity Neuro: grossly intact, normal gait Psych: normal mood and affect, appropriate insight Skin: warm and dry, no rash or lesion on limited exam    Assessment & Plan (Nancy Manuele A. Kae Heller MD; 01/18/2018 9:29 AM)  BILIARY DYSKINESIA (K82.8) Story: Recommended laparoscopic cholecystectomy. We discussed the technique of the operation, typical postop recovery, as well as the risks of bleeding, infection, pain, scarring, intra-abdominal injury specifically to the common bile duct and sequelae, conversion to open surgery, as well as the general risks of blood clots, pneumonia, heart attack, stroke and death. We also discussed the small chance that her symptoms are not entirely attributable to biliary dyskinesia, in which case if she does continue to have symptoms she will need to be referred for upper endoscopy. Questions were welcomed and answered. We will get her scheduled at her convenience-she wants to postpone until after the graduation in early May.

## 2018-03-02 ENCOUNTER — Other Ambulatory Visit: Payer: Self-pay

## 2018-03-02 ENCOUNTER — Encounter (HOSPITAL_COMMUNITY): Payer: Self-pay | Admitting: *Deleted

## 2018-03-06 ENCOUNTER — Encounter (HOSPITAL_COMMUNITY): Payer: Self-pay | Admitting: *Deleted

## 2018-03-06 ENCOUNTER — Ambulatory Visit (HOSPITAL_COMMUNITY): Payer: BLUE CROSS/BLUE SHIELD | Admitting: Anesthesiology

## 2018-03-06 ENCOUNTER — Encounter (HOSPITAL_COMMUNITY)
Admission: RE | Disposition: A | Discharge status: Home / Self Care | Payer: Self-pay | Source: Ambulatory Visit | Attending: Surgery

## 2018-03-06 ENCOUNTER — Telehealth (HOSPITAL_COMMUNITY): Payer: Self-pay | Admitting: *Deleted

## 2018-03-06 ENCOUNTER — Ambulatory Visit (HOSPITAL_COMMUNITY)
Admission: RE | Admit: 2018-03-06 | Discharge: 2018-03-06 | Disposition: A | Discharge status: Home / Self Care | Payer: BLUE CROSS/BLUE SHIELD | Source: Ambulatory Visit | Attending: Surgery | Admitting: Surgery

## 2018-03-06 DIAGNOSIS — R1013 Epigastric pain: Secondary | ICD-10-CM | POA: Diagnosis present

## 2018-03-06 DIAGNOSIS — J45909 Unspecified asthma, uncomplicated: Secondary | ICD-10-CM | POA: Diagnosis not present

## 2018-03-06 DIAGNOSIS — E669 Obesity, unspecified: Secondary | ICD-10-CM | POA: Insufficient documentation

## 2018-03-06 DIAGNOSIS — Z419 Encounter for procedure for purposes other than remedying health state, unspecified: Secondary | ICD-10-CM

## 2018-03-06 DIAGNOSIS — I10 Essential (primary) hypertension: Secondary | ICD-10-CM | POA: Diagnosis not present

## 2018-03-06 DIAGNOSIS — E282 Polycystic ovarian syndrome: Secondary | ICD-10-CM | POA: Diagnosis not present

## 2018-03-06 DIAGNOSIS — F17200 Nicotine dependence, unspecified, uncomplicated: Secondary | ICD-10-CM | POA: Diagnosis not present

## 2018-03-06 DIAGNOSIS — K811 Chronic cholecystitis: Secondary | ICD-10-CM | POA: Diagnosis not present

## 2018-03-06 DIAGNOSIS — Z79899 Other long term (current) drug therapy: Secondary | ICD-10-CM | POA: Diagnosis not present

## 2018-03-06 DIAGNOSIS — Z7951 Long term (current) use of inhaled steroids: Secondary | ICD-10-CM | POA: Insufficient documentation

## 2018-03-06 DIAGNOSIS — F419 Anxiety disorder, unspecified: Secondary | ICD-10-CM | POA: Diagnosis not present

## 2018-03-06 DIAGNOSIS — Z6838 Body mass index (BMI) 38.0-38.9, adult: Secondary | ICD-10-CM | POA: Insufficient documentation

## 2018-03-06 DIAGNOSIS — K828 Other specified diseases of gallbladder: Secondary | ICD-10-CM | POA: Diagnosis not present

## 2018-03-06 HISTORY — DX: Essential (primary) hypertension: I10

## 2018-03-06 HISTORY — DX: Unspecified osteoarthritis, unspecified site: M19.90

## 2018-03-06 HISTORY — DX: Gastro-esophageal reflux disease without esophagitis: K21.9

## 2018-03-06 HISTORY — PX: CHOLECYSTECTOMY: SHX55

## 2018-03-06 LAB — BASIC METABOLIC PANEL
Anion gap: 12 (ref 5–15)
BUN: 12 mg/dL (ref 6–20)
CALCIUM: 8.5 mg/dL — AB (ref 8.9–10.3)
CHLORIDE: 101 mmol/L (ref 101–111)
CO2: 25 mmol/L (ref 22–32)
CREATININE: 0.87 mg/dL (ref 0.44–1.00)
Glucose, Bld: 90 mg/dL (ref 65–99)
Potassium: 4.1 mmol/L (ref 3.5–5.1)
SODIUM: 138 mmol/L (ref 135–145)

## 2018-03-06 LAB — CBC WITH DIFFERENTIAL/PLATELET
BASOS ABS: 0 10*3/uL (ref 0.0–0.1)
BASOS PCT: 0 %
EOS ABS: 0.3 10*3/uL (ref 0.0–0.7)
Eosinophils Relative: 5 %
HCT: 42.1 % (ref 36.0–46.0)
HEMOGLOBIN: 14.9 g/dL (ref 12.0–15.0)
Lymphocytes Relative: 35 %
Lymphs Abs: 1.8 10*3/uL (ref 0.7–4.0)
MCH: 30.7 pg (ref 26.0–34.0)
MCHC: 35.4 g/dL (ref 30.0–36.0)
MCV: 86.8 fL (ref 78.0–100.0)
Monocytes Absolute: 0.7 10*3/uL (ref 0.1–1.0)
Monocytes Relative: 14 %
NEUTROS PCT: 46 %
Neutro Abs: 2.3 10*3/uL (ref 1.7–7.7)
PLATELETS: 248 10*3/uL (ref 150–400)
RBC: 4.85 MIL/uL (ref 3.87–5.11)
RDW: 14.5 % (ref 11.5–15.5)
WBC: 5.1 10*3/uL (ref 4.0–10.5)

## 2018-03-06 LAB — HCG, SERUM, QUALITATIVE: PREG SERUM: NEGATIVE

## 2018-03-06 SURGERY — LAPAROSCOPIC CHOLECYSTECTOMY
Anesthesia: General | Site: Abdomen

## 2018-03-06 MED ORDER — LACTATED RINGERS IV SOLN: INTRAVENOUS | Status: DC

## 2018-03-06 MED ORDER — CEFAZOLIN SODIUM-DEXTROSE 2-4 GM/100ML-% IV SOLN
2.0000 g | INTRAVENOUS | Status: AC
  Administered 2018-03-06: 2 g via INTRAVENOUS
  Filled 2018-03-06: qty 100

## 2018-03-06 MED ORDER — LACTATED RINGERS IR SOLN
Status: DC | PRN
  Administered 2018-03-06: 1000 mL

## 2018-03-06 MED ORDER — HYDROMORPHONE HCL 1 MG/ML IJ SOLN: 0.2500 mg | INTRAMUSCULAR | Status: DC | PRN

## 2018-03-06 MED ORDER — BUPIVACAINE-EPINEPHRINE (PF) 0.25% -1:200000 IJ SOLN
INTRAMUSCULAR | Status: AC
  Filled 2018-03-06: qty 30

## 2018-03-06 MED ORDER — LABETALOL HCL 5 MG/ML IV SOLN
INTRAVENOUS | Status: DC | PRN
  Administered 2018-03-06 (×4): 5 mg via INTRAVENOUS

## 2018-03-06 MED ORDER — HYDRALAZINE HCL 20 MG/ML IJ SOLN
INTRAMUSCULAR | Status: DC | PRN
  Administered 2018-03-06: 4 mg via INTRAVENOUS

## 2018-03-06 MED ORDER — CHLORHEXIDINE GLUCONATE 4 % EX LIQD: 60.0000 mL | Freq: Once | CUTANEOUS | Status: DC

## 2018-03-06 MED ORDER — HYDROCODONE-ACETAMINOPHEN 5-325 MG PO TABS: 1.0000 | ORAL_TABLET | Freq: Four times a day (QID) | ORAL | 0 refills | Status: DC | PRN

## 2018-03-06 MED ORDER — FENTANYL CITRATE (PF) 100 MCG/2ML IJ SOLN
INTRAMUSCULAR | Status: DC | PRN
  Administered 2018-03-06: 50 ug via INTRAVENOUS
  Administered 2018-03-06 (×2): 100 ug via INTRAVENOUS
  Administered 2018-03-06 (×2): 50 ug via INTRAVENOUS

## 2018-03-06 MED ORDER — PROPOFOL 10 MG/ML IV BOLUS
INTRAVENOUS | Status: AC
  Filled 2018-03-06: qty 20

## 2018-03-06 MED ORDER — SUGAMMADEX SODIUM 200 MG/2ML IV SOLN
INTRAVENOUS | Status: DC | PRN
  Administered 2018-03-06: 250 mg via INTRAVENOUS

## 2018-03-06 MED ORDER — MIDAZOLAM HCL 2 MG/2ML IJ SOLN
INTRAMUSCULAR | Status: AC
  Filled 2018-03-06: qty 2

## 2018-03-06 MED ORDER — FENTANYL CITRATE (PF) 100 MCG/2ML IJ SOLN
INTRAMUSCULAR | Status: AC
  Filled 2018-03-06: qty 2

## 2018-03-06 MED ORDER — ROCURONIUM BROMIDE 100 MG/10ML IV SOLN
INTRAVENOUS | Status: DC | PRN
  Administered 2018-03-06: 10 mg via INTRAVENOUS
  Administered 2018-03-06: 50 mg via INTRAVENOUS

## 2018-03-06 MED ORDER — ALBUTEROL SULFATE HFA 108 (90 BASE) MCG/ACT IN AERS
INHALATION_SPRAY | RESPIRATORY_TRACT | Status: DC | PRN
  Administered 2018-03-06: 4 via RESPIRATORY_TRACT

## 2018-03-06 MED ORDER — BUPIVACAINE HCL (PF) 0.25 % IJ SOLN
INTRAMUSCULAR | Status: AC
  Filled 2018-03-06: qty 30

## 2018-03-06 MED ORDER — MEPERIDINE HCL 50 MG/ML IJ SOLN: 6.2500 mg | INTRAMUSCULAR | Status: DC | PRN

## 2018-03-06 MED ORDER — GABAPENTIN 300 MG PO CAPS
300.0000 mg | ORAL_CAPSULE | ORAL | Status: AC
  Administered 2018-03-06: 300 mg via ORAL
  Filled 2018-03-06: qty 1

## 2018-03-06 MED ORDER — MIDAZOLAM HCL 5 MG/5ML IJ SOLN
INTRAMUSCULAR | Status: DC | PRN
  Administered 2018-03-06: 1 mg via INTRAVENOUS

## 2018-03-06 MED ORDER — IOPAMIDOL (ISOVUE-300) INJECTION 61%
INTRAVENOUS | Status: DC | PRN
  Administered 2018-03-06: 50 mL via INTRAVENOUS

## 2018-03-06 MED ORDER — IOPAMIDOL (ISOVUE-300) INJECTION 61%
INTRAVENOUS | Status: AC
Start: 2018-03-06 — End: ?
  Filled 2018-03-06: qty 50

## 2018-03-06 MED ORDER — SODIUM CHLORIDE 0.9 % IJ SOLN
INTRAMUSCULAR | Status: AC
  Filled 2018-03-06: qty 20

## 2018-03-06 MED ORDER — SCOPOLAMINE 1 MG/3DAYS TD PT72
MEDICATED_PATCH | TRANSDERMAL | Status: DC | PRN
  Administered 2018-03-06: 1 via TRANSDERMAL

## 2018-03-06 MED ORDER — FENTANYL CITRATE (PF) 250 MCG/5ML IJ SOLN
INTRAMUSCULAR | Status: AC
  Filled 2018-03-06: qty 5

## 2018-03-06 MED ORDER — DOCUSATE SODIUM 100 MG PO CAPS: 100.0000 mg | ORAL_CAPSULE | Freq: Two times a day (BID) | ORAL | 0 refills | Status: AC

## 2018-03-06 MED ORDER — 0.9 % SODIUM CHLORIDE (POUR BTL) OPTIME
TOPICAL | Status: DC | PRN
  Administered 2018-03-06: 1000 mL

## 2018-03-06 MED ORDER — ALBUTEROL SULFATE HFA 108 (90 BASE) MCG/ACT IN AERS
INHALATION_SPRAY | RESPIRATORY_TRACT | Status: AC
  Filled 2018-03-06: qty 6.7

## 2018-03-06 MED ORDER — ONDANSETRON HCL 4 MG/2ML IJ SOLN
INTRAMUSCULAR | Status: AC
  Filled 2018-03-06: qty 2

## 2018-03-06 MED ORDER — LIDOCAINE HCL (CARDIAC) PF 100 MG/5ML IV SOSY
PREFILLED_SYRINGE | INTRAVENOUS | Status: DC | PRN
  Administered 2018-03-06: 60 mg via INTRAVENOUS

## 2018-03-06 MED ORDER — HYDRALAZINE HCL 20 MG/ML IJ SOLN
INTRAMUSCULAR | Status: AC
  Filled 2018-03-06: qty 1

## 2018-03-06 MED ORDER — BUPIVACAINE-EPINEPHRINE 0.25% -1:200000 IJ SOLN
INTRAMUSCULAR | Status: DC | PRN
  Administered 2018-03-06: 30 mL

## 2018-03-06 MED ORDER — CELECOXIB 200 MG PO CAPS
200.0000 mg | ORAL_CAPSULE | ORAL | Status: AC
  Administered 2018-03-06: 200 mg via ORAL
  Filled 2018-03-06: qty 1

## 2018-03-06 MED ORDER — ACETAMINOPHEN 500 MG PO TABS
1000.0000 mg | ORAL_TABLET | ORAL | Status: AC
  Administered 2018-03-06: 1000 mg via ORAL
  Filled 2018-03-06: qty 2

## 2018-03-06 MED ORDER — ONDANSETRON HCL 4 MG/2ML IJ SOLN
INTRAMUSCULAR | Status: DC | PRN
  Administered 2018-03-06: 4 mg via INTRAVENOUS

## 2018-03-06 MED ORDER — LACTATED RINGERS IV SOLN
INTRAVENOUS | Status: DC
  Administered 2018-03-06: 12:00:00 via INTRAVENOUS

## 2018-03-06 MED ORDER — PROMETHAZINE HCL 25 MG/ML IJ SOLN: 6.2500 mg | INTRAMUSCULAR | Status: DC | PRN

## 2018-03-06 MED ORDER — SUGAMMADEX SODIUM 500 MG/5ML IV SOLN
INTRAVENOUS | Status: AC
  Filled 2018-03-06: qty 5

## 2018-03-06 MED ORDER — DEXAMETHASONE SODIUM PHOSPHATE 10 MG/ML IJ SOLN
INTRAMUSCULAR | Status: DC | PRN
  Administered 2018-03-06: 10 mg via INTRAVENOUS

## 2018-03-06 MED ORDER — PROPOFOL 10 MG/ML IV BOLUS
INTRAVENOUS | Status: DC | PRN
  Administered 2018-03-06: 50 mg via INTRAVENOUS
  Administered 2018-03-06: 150 mg via INTRAVENOUS

## 2018-03-06 MED ORDER — CHLORHEXIDINE GLUCONATE 4 % EX LIQD
60.0000 mL | Freq: Once | CUTANEOUS | Status: AC
  Administered 2018-03-06: 4 via TOPICAL

## 2018-03-06 MED ORDER — LABETALOL HCL 5 MG/ML IV SOLN
INTRAVENOUS | Status: AC
Start: 2018-03-06 — End: ?
  Filled 2018-03-06: qty 4

## 2018-03-06 MED ORDER — SCOPOLAMINE 1 MG/3DAYS TD PT72
MEDICATED_PATCH | TRANSDERMAL | Status: AC
  Filled 2018-03-06: qty 1

## 2018-03-06 SURGICAL SUPPLY — 32 items
APPLIER CLIP ROT 10 11.4 M/L (STAPLE) ×3
CABLE HIGH FREQUENCY MONO STRZ (ELECTRODE) ×3 IMPLANT
CHLORAPREP W/TINT 26ML (MISCELLANEOUS) ×3 IMPLANT
CLIP APPLIE ROT 10 11.4 M/L (STAPLE) ×1 IMPLANT
COVER MAYO STAND STRL (DRAPES) ×3 IMPLANT
COVER SURGICAL LIGHT HANDLE (MISCELLANEOUS) ×3 IMPLANT
DECANTER SPIKE VIAL GLASS SM (MISCELLANEOUS) ×3 IMPLANT
DERMABOND ADVANCED (GAUZE/BANDAGES/DRESSINGS) ×2
DERMABOND ADVANCED .7 DNX12 (GAUZE/BANDAGES/DRESSINGS) ×1 IMPLANT
DRAPE C-ARM 42X120 X-RAY (DRAPES) ×3 IMPLANT
DRAPE LAPAROSCOPIC ABDOMINAL (DRAPES) ×3 IMPLANT
ELECT REM PT RETURN 15FT ADLT (MISCELLANEOUS) ×3 IMPLANT
GLOVE BIO SURGEON STRL SZ 6 (GLOVE) ×3 IMPLANT
GLOVE INDICATOR 6.5 STRL GRN (GLOVE) ×3 IMPLANT
GOWN STRL REUS W/TWL LRG LVL3 (GOWN DISPOSABLE) ×3 IMPLANT
GOWN STRL REUS W/TWL XL LVL3 (GOWN DISPOSABLE) ×6 IMPLANT
GRASPER SUT TROCAR 14GX15 (MISCELLANEOUS) ×3 IMPLANT
HEMOSTAT SNOW SURGICEL 2X4 (HEMOSTASIS) IMPLANT
KIT BASIN OR (CUSTOM PROCEDURE TRAY) ×3 IMPLANT
NEEDLE INSUFFLATION 14GA 120MM (NEEDLE) ×3 IMPLANT
POUCH SPECIMEN RETRIEVAL 10MM (ENDOMECHANICALS) ×3 IMPLANT
SCISSORS LAP 5X35 DISP (ENDOMECHANICALS) ×3 IMPLANT
SET CHOLANGIOGRAPH MIX (MISCELLANEOUS) ×3 IMPLANT
SET IRRIG TUBING LAPAROSCOPIC (IRRIGATION / IRRIGATOR) ×3 IMPLANT
SLEEVE XCEL OPT CAN 5 100 (ENDOMECHANICALS) ×6 IMPLANT
SUT MNCRL AB 4-0 PS2 18 (SUTURE) ×3 IMPLANT
TOWEL OR 17X26 10 PK STRL BLUE (TOWEL DISPOSABLE) ×3 IMPLANT
TOWEL OR NON WOVEN STRL DISP B (DISPOSABLE) IMPLANT
TRAY LAPAROSCOPIC (CUSTOM PROCEDURE TRAY) ×3 IMPLANT
TROCAR BLADELESS OPT 5 100 (ENDOMECHANICALS) ×3 IMPLANT
TROCAR XCEL 12X100 BLDLESS (ENDOMECHANICALS) ×3 IMPLANT
TUBING INSUF HEATED (TUBING) ×3 IMPLANT

## 2018-03-06 NOTE — Discharge Instructions (Signed)
LAPAROSCOPIC SURGERY: POST OP INSTRUCTIONS  ######################################################################  EAT Gradually transition to a high fiber diet with a fiber supplement over the next few weeks after discharge.  Start with a pureed / full liquid diet (see below)  WALK Walk an hour a day.  Control your pain to do that.    CONTROL PAIN Control pain so that you can walk, sleep, tolerate sneezing/coughing, go up/down stairs.  HAVE A BOWEL MOVEMENT DAILY Keep your bowels regular to avoid problems.  OK to try a laxative to override constipation.  OK to use an antidairrheal to slow down diarrhea.  Call if not better after 2 tries  CALL IF YOU HAVE PROBLEMS/CONCERNS Call if you are still struggling despite following these instructions. Call if you have concerns not answered by these instructions  ######################################################################    1. DIET: Follow a light bland diet the first 24 hours after arrival home, such as soup, liquids, crackers, etc.  Be sure to include lots of fluids daily.  Avoid fast food or heavy meals as your are more likely to get nauseated.  Eat a low fat the next few days after surgery.   2. Take your usually prescribed home medications unless otherwise directed. 3. PAIN CONTROL: a. Pain is best controlled by a usual combination of three different methods TOGETHER: i. Ice/Heat ii. Over the counter pain medication iii. Prescription pain medication b. Most patients will experience some swelling and bruising around the incisions.  Ice packs or heating pads (30-60 minutes up to 6 times a day) will help. Use ice for the first few days to help decrease swelling and bruising, then switch to heat to help relax tight/sore spots and speed recovery.  Some people prefer to use ice alone, heat alone, alternating between ice & heat.  Experiment to what works for you.  Swelling and bruising can take several weeks to resolve.   c. It is  helpful to take an over-the-counter pain medication regularly for the first few weeks.  Choose one of the following that works best for you: i. Naproxen (Aleve, etc)  Two 256m tabs twice a day ii. Ibuprofen (Advil, etc) Three 2075mtabs four times a day (every meal & bedtime) iii. Acetaminophen (Tylenol, etc) 500-65039mour times a day (every meal & bedtime) d. A  prescription for pain medication (such as oxycodone, hydrocodone, etc) should be given to you upon discharge.  Take your pain medication as prescribed.  i. If you are having problems/concerns with the prescription medicine (does not control pain, nausea, vomiting, rash, itching, etc), please call us [phone] see if we need to switch you to a different pain medicine that will work better for you and/or control your side effect better. ii. If you need a refill on your pain medication, please contact your pharmacy.  They will contact our office to request authorization. Prescriptions will not be filled after 5 pm or on week-ends. 4. Avoid getting constipated.  Between the surgery and the pain medications, it is common to experience some constipation.  Increasing fluid intake and taking a fiber supplement (such as Metamucil, Citrucel, FiberCon, MiraLax, etc) 1-2 times a day regularly will usually help prevent this problem from occurring.  A mild laxative (prune juice, Milk of Magnesia, MiraLax, etc) should be taken according to package directions if there are no bowel movements after 48 hours.   5. Watch out for diarrhea.  If you have many loose bowel movements, simplify your diet to bland foods & liquids for  a few days.  Stop any stool softeners and decrease your fiber supplement.  Switching to mild anti-diarrheal medications (Kayopectate, Pepto Bismol) can help.  If this worsens or does not improve, please call us. 6. Wash / shower every day.  You may shower over the skin glue which is waterproof.  Continue to shower over incision(s) after  the dressing is off. 7. Skin glue will flake off after 2 weeks.  You may leave the incision open to air.  You may replace a dressing/Band-Aid to cover the incision for comfort if you wish.  8. ACTIVITIES as tolerated:   a. You may resume regular (light) daily activities beginning the next day--such as daily self-care, walking, climbing stairs--gradually increasing activities as tolerated.  If you can walk 30 minutes without difficulty, it is safe to try more intense activity such as jogging, treadmill, bicycling, low-impact aerobics, swimming, etc. b. Save the most intensive and strenuous activity for last such as sit-ups, heavy lifting, contact sports, etc  Refrain from any heavy lifting or straining until you are off narcotics for pain control.   c. DO NOT PUSH THROUGH PAIN.  Let pain be your guide: If it hurts to do something, don't do it.  Pain is your body warning you to avoid that activity for another week until the pain goes down. d. You may drive when you are no longer taking prescription pain medication, you can comfortably wear a seatbelt, and you can safely maneuver your car and apply brakes. e. Dennis Bast may have sexual intercourse when it is comfortable.  9. FOLLOW UP in our office a. Please call [facility] at [phone] to set up an appointment to see your surgeon in the office for a follow-up appointment approximately 2-3 weeks after your surgery. b. Make sure that you call for this appointment the day you arrive home to insure a convenient appointment time. 10. IF YOU HAVE DISABILITY OR FAMILY LEAVE FORMS, BRING THEM TO THE OFFICE FOR PROCESSING.  DO NOT GIVE THEM TO YOUR DOCTOR.   WHEN TO CALL us [phone]: 1. Poor pain control 2. Reactions / problems with new medications (rash/itching, nausea, etc)  3. Fever over 101.5 F (38.5 C) 4. Inability to urinate 5. Nausea and/or vomiting 6. Worsening swelling or bruising 7. Continued bleeding from incision. 8. Increased pain, redness, or  drainage from the incision   The clinic staff is available to answer your questions during regular business hours (8:30am-5pm).  Please dont hesitate to call and ask to speak to one of our nurses for clinical concerns.   If you have a medical emergency, go to the nearest emergency room or call 911.  A surgeon from [facility] is always on call at the [facility], [place], [place], [place],   [address] ? MAIN: [phone] ? TOLL FREE: [phone] ?  FAX [phone] [email or web address]

## 2018-03-06 NOTE — Anesthesia Procedure Notes (Signed)
Procedure Name: Intubation Date/Time: 03/06/2018 2:20 PM Performed by: Glory Buff, CRNA Pre-anesthesia Checklist: Patient identified, Emergency Drugs available, Suction available and Patient being monitored Patient Re-evaluated:Patient Re-evaluated prior to induction Oxygen Delivery Method: Circle system utilized Preoxygenation: Pre-oxygenation with 100% oxygen Induction Type: IV induction Ventilation: Two handed mask ventilation required and Oral airway inserted - appropriate to patient size Laryngoscope Size: Sabra Heck and 2 Grade View: Grade I Tube type: Oral Tube size: 7.0 mm Number of attempts: 2 (SRNA DL x1 no view, CRNA DL x1 ) Airway Equipment and Method: Stylet Placement Confirmation: ETT inserted through vocal cords under direct vision,  positive ETCO2,  CO2 detector and breath sounds checked- equal and bilateral Secured at: 22 cm Tube secured with: Tape Dental Injury: Teeth and Oropharynx as per pre-operative assessment

## 2018-03-06 NOTE — Anesthesia Preprocedure Evaluation (Signed)
Anesthesia Evaluation    Airway Mallampati: II  TM Distance: >3 FB Neck ROM: Full    Dental  (+) Poor Dentition   Pulmonary Current Smoker,    breath sounds clear to auscultation       Cardiovascular hypertension,  Rhythm:Regular Rate:Normal     Neuro/Psych    GI/Hepatic GERD  ,  Endo/Other    Renal/GU      Musculoskeletal   Abdominal (+) + obese,   Peds  Hematology   Anesthesia Other Findings   Reproductive/Obstetrics                            Anesthesia Physical Anesthesia Plan  ASA: II  Anesthesia Plan: General   Post-op Pain Management:    Induction: Intravenous  PONV Risk Score and Plan: 3 and Ondansetron and Dexamethasone  Airway Management Planned: Oral ETT  Additional Equipment: None  Intra-op Plan:   Post-operative Plan: Extubation in OR  Informed Consent: I have reviewed the patients History and Physical, chart, labs and discussed the procedure including the risks, benefits and alternatives for the proposed anesthesia with the patient or authorized representative who has indicated his/her understanding and acceptance.   Dental advisory given  Plan Discussed with: CRNA  Anesthesia Plan Comments:         Anesthesia Quick Evaluation

## 2018-03-06 NOTE — Anesthesia Postprocedure Evaluation (Signed)
Anesthesia Post Note  Patient: Heather Salazar  Procedure(s) Performed: LAPAROSCOPIC CHOLECYSTECTOMY ERAS PATHWAY (N/A Abdomen)     Patient location during evaluation: PACU Anesthesia Type: General Level of consciousness: awake and alert Pain management: pain level controlled Vital Signs Assessment: post-procedure vital signs reviewed and stable Respiratory status: spontaneous breathing, nonlabored ventilation, respiratory function stable and patient connected to nasal cannula oxygen Cardiovascular status: blood pressure returned to baseline and stable Postop Assessment: no apparent nausea or vomiting Anesthetic complications: no    Last Vitals:  Vitals:   03/06/18 1530 03/06/18 1545  BP: 138/86 119/74  Pulse: 72 70  Resp: (!) 23 16  Temp:  36.7 C  SpO2: 99% 98%    Last Pain:  Vitals:   03/06/18 1612  TempSrc:   PainSc: 0-No pain                 Effie Berkshire

## 2018-03-06 NOTE — Transfer of Care (Signed)
Immediate Anesthesia Transfer of Care Note  Patient: Heather Salazar  Procedure(s) Performed: [place] (N/A Abdomen)  Patient Location: PACU  Anesthesia Type:General  Level of Consciousness: awake, alert  and oriented  Airway & Oxygen Therapy: Patient Spontanous Breathing and Patient connected to face mask oxygen  Post-op Assessment: Report given to RN and Post -op Vital signs reviewed and stable  Post vital signs: Reviewed and stable  Last Vitals:  Vitals Value Taken Time  BP 146/92 03/06/2018  3:23 PM  Temp    Pulse 70 03/06/2018  3:23 PM  Resp 24 03/06/2018  3:24 PM  SpO2 95 % 03/06/2018  3:23 PM  Vitals shown include unvalidated device data.  Last Pain:  Vitals:   03/06/18 1207  TempSrc:   PainSc: 0-No pain         Complications: No apparent anesthesia complications

## 2018-03-06 NOTE — H&P (Signed)
Heather Salazar DOB: [birth year] Single / Language: Heather Salazar / Race: Black or African American Female  History of Present Illness Patient words: 47 year old woman with a history of obesity, hypertension, anxiety, asthma and she was referred by Dr. [name] for biliary dyskinesia. She presented to the emergency room in mid March with a week of intermittent nausea, epigastric pain and occasional vomiting. The pain was sharp, upper abdominal and somewhat to the right. Eating exacerbates the discomfort. She was thought to have gastritis and was advised to stop taking the naproxen that she had been using regularly for her PCO symptoms, was advised to take omeprazole every day as well as nausea medication as needed. She was referred for an ultrasound which did show sludge in the gallbladder, common bile duct 2 mm, diffuse hepatic steatosis. Subsequently HIDA scan showed abnormally low ejection fraction of 25.7% consistent with biliary dyskinesia. She had a CMP on April 8 that was unremarkable. She has stopped taking NSAIDs as advised. She does continue to have the discomfort.  Surgical history notable for C-section. She also had a diagnostic laparoscopy for infertility.  She smokes half pack a day. She works at [employer] which is a very stressful job. She has 2 kids, ages 38 and 3, one of whom is graduating college on May 3 and then coming here to [place] for postgraduate work..   Review of Systems  All other systems negative   Physical Exam   The physical exam findings are as follows: Note:Gen: alert and well appearing Eye: extraocular motion intact, no scleral icterus ENT: moist mucus membranes, dentition intact Neck: no mass or thyromegaly Chest: unlabored respirations, symmetrical air entry, clear bilaterally CV: regular rate and rhythm, no pedal edema Abdomen: soft, very minimally subjectively tender along the epigastrium and right subcostal margin,  nondistended. No mass or organomegaly. Well-healed infraumbilical scar. MSK: strength symmetrical throughout, no deformity Neuro: grossly intact, normal gait Psych: normal mood and affect, appropriate insight Skin: warm and dry, no rash or lesion on limited exam    Assessment & Plan   BILIARY DYSKINESIA (K82.8) Story: Recommended laparoscopic cholecystectomy. We discussed the technique of the operation, typical postop recovery, as well as the risks of bleeding, infection, pain, scarring, intra-abdominal injury specifically to the common bile duct and sequelae, conversion to open surgery, as well as the general risks of blood clots, pneumonia, heart attack, stroke and death. We also discussed the small chance that her symptoms are not entirely attributable to biliary dyskinesia, in which case if she does continue to have symptoms she will need to be referred for upper endoscopy. Questions were welcomed and answered. We will get her scheduled at her convenience-she wants to postpone until after the graduation in early May.

## 2018-03-06 NOTE — Op Note (Signed)
Operative Note  Heather Salazar 47 y.o. female [id number]  03/06/2018  Surgeon: Clovis Riley MD  Assistant: none  Procedure performed: Laparoscopic Cholecystectomy  Preop diagnosis: biliary dyskinesia Post-op diagnosis/intraop findings: same  Specimens: gallbladder  EBL: minimal  Complications: none  Description of procedure: After obtaining informed consent the patient was brought to the operating room. Prophylactic antibiotics and subcutaneous heparin were administered. SCD's were applied. General endotracheal anesthesia was initiated and a formal time-out was performed. The abdomen was prepped and draped in the usual sterile fashion and the abdomen was entered using visiport technique in the left upper quadrant after instilling the site with local. Insufflation to 7mHg was obtained and gross inspection revealed no evidence of injury from our entry or other intraabdominal abnormalities. Some filmy omental adhesions to the anterior abdominal wall at the level of the umbilicus were bluntly swept away. Three 552mtrocars were introduced in the infraumbilical, right midclavicular and right anterior axillary lines under direct visualization and following infiltration with local. Entry port upsized to an 1141mrocar. Filmy adhesions between the fatty liver and the anterior abdominal wall were divided with cautery. The gallbladder was retracted cephalad and the infundibulum was retracted laterally. A combination of hook electrocautery and blunt dissection was utilized to clear the peritoneum from the neck and cystic duct, circumferentially isolating the cystic artery and cystic duct and lifting the gallbladder from the cystic plate. The critical view of safety was achieved with the cystic artery, cystic duct, and liver bed visualized between them with no other structures. The artery was diminutive and divided with cautery. The cystic duct was ligated with three clips on the proximal end and  divided sharply. The gallbladder was dissected from the liver plate using electrocautery. Once freed the gallbladder was placed in an endocatch bag and removed intact through the 16m50mocar site. The right upper quadrant was irrigated and aspirated, the effluent was clear. Hemostasis was once again confirmed, and reinspection of the abdomen revealed no injuries. The clips were well opposed without any bile leak from the duct or the liver bed. The 16mm2mcar site in the epigastrium was closed with a 0 vicryl in the fascia under direct visualization using a PMI device. The abdomen was desufflated and all trocars removed. The skin incisions were closed with running subcuticular monocryl and Dermabond. The patient was awakened, extubated and transported to the recovery room in stable condition.   All counts were correct at the completion of the case.

## 2018-03-07 ENCOUNTER — Encounter (HOSPITAL_COMMUNITY): Payer: Self-pay | Admitting: Surgery

## 2018-05-04 DIAGNOSIS — N939 Abnormal uterine and vaginal bleeding, unspecified: Secondary | ICD-10-CM | POA: Diagnosis not present

## 2018-05-04 DIAGNOSIS — N938 Other specified abnormal uterine and vaginal bleeding: Secondary | ICD-10-CM | POA: Diagnosis not present

## 2018-05-04 DIAGNOSIS — Z3202 Encounter for pregnancy test, result negative: Secondary | ICD-10-CM | POA: Diagnosis not present

## 2018-05-07 DIAGNOSIS — H524 Presbyopia: Secondary | ICD-10-CM | POA: Diagnosis not present

## 2018-05-07 DIAGNOSIS — N938 Other specified abnormal uterine and vaginal bleeding: Secondary | ICD-10-CM | POA: Diagnosis not present

## 2018-05-07 DIAGNOSIS — N711 Chronic inflammatory disease of uterus: Secondary | ICD-10-CM | POA: Diagnosis not present

## 2018-05-07 DIAGNOSIS — N939 Abnormal uterine and vaginal bleeding, unspecified: Secondary | ICD-10-CM | POA: Diagnosis not present

## 2018-09-13 ENCOUNTER — Encounter: Payer: Self-pay | Admitting: Sports Medicine

## 2018-09-13 ENCOUNTER — Ambulatory Visit: Payer: BLUE CROSS/BLUE SHIELD | Admitting: Sports Medicine

## 2018-09-13 DIAGNOSIS — R740 Nonspecific elevation of levels of transaminase and lactic acid dehydrogenase [LDH]: Secondary | ICD-10-CM | POA: Diagnosis not present

## 2018-09-13 DIAGNOSIS — K219 Gastro-esophageal reflux disease without esophagitis: Secondary | ICD-10-CM

## 2018-09-13 DIAGNOSIS — R7401 Elevation of levels of liver transaminase levels: Secondary | ICD-10-CM

## 2018-09-13 MED ORDER — PANTOPRAZOLE SODIUM 40 MG PO TBEC
40.0000 mg | DELAYED_RELEASE_TABLET | Freq: Every day | ORAL | 3 refills | Status: DC
Start: 2018-09-13 — End: 2021-05-05

## 2018-09-13 NOTE — Patient Instructions (Signed)
Gastroesophageal Reflux Disease, Adult Normally, food travels down the esophagus and stays in the stomach to be digested. If a person has gastroesophageal reflux disease (GERD), food and stomach acid move back up into the esophagus. When this happens, the esophagus becomes sore and swollen (inflamed). Over time, GERD can make small holes (ulcers) in the lining of the esophagus. Follow these instructions at home: Diet  Follow a diet as told by your doctor. You may need to avoid foods and drinks such as: ? Coffee and tea (with or without caffeine). ? Drinks that contain alcohol. ? Energy drinks and sports drinks. ? Carbonated drinks or sodas. ? Chocolate and cocoa. ? Peppermint and mint flavorings. ? Garlic and onions. ? Horseradish. ? Spicy and acidic foods, such as peppers, chili powder, curry powder, vinegar, hot sauces, and BBQ sauce. ? Citrus fruit juices and citrus fruits, such as oranges, lemons, and limes. ? Tomato-based foods, such as red sauce, chili, salsa, and pizza with red sauce. ? Fried and fatty foods, such as donuts, french fries, potato chips, and high-fat dressings. ? High-fat meats, such as hot dogs, rib eye steak, sausage, ham, and bacon. ? High-fat dairy items, such as whole milk, butter, and cream cheese.  Eat small meals often. Avoid eating large meals.  Avoid drinking large amounts of liquid with your meals.  Avoid eating meals during the 2-3 hours before bedtime.  Avoid lying down right after you eat.  Do not exercise right after you eat. General instructions  Pay attention to any changes in your symptoms.  Take over-the-counter and prescription medicines only as told by your doctor. Do not take aspirin, ibuprofen, or other NSAIDs unless your doctor says it is okay.  Do not use any tobacco products, including cigarettes, chewing tobacco, and e-cigarettes. If you need help quitting, ask your doctor.  Wear loose clothes. Do not wear anything tight around  your waist.  Raise (elevate) the head of your bed about 6 inches (15 cm).  Try to lower your stress. If you need help doing this, ask your doctor.  If you are overweight, lose an amount of weight that is healthy for you. Ask your doctor about a safe weight loss goal.  Keep all follow-up visits as told by your doctor. This is important. Contact a doctor if:  You have new symptoms.  You lose weight and you do not know why it is happening.  You have trouble swallowing, or it hurts to swallow.  You have wheezing or a cough that keeps happening.  Your symptoms do not get better with treatment.  You have a hoarse voice. Get help right away if:  You have pain in your arms, neck, jaw, teeth, or back.  You feel sweaty, dizzy, or light-headed.  You have chest pain or shortness of breath.  You throw up (vomit) and your throw up looks like blood or coffee grounds.  You pass out (faint).  Your poop (stool) is bloody or black.  You cannot swallow, drink, or eat. This information is not intended to replace advice given to you by your health care provider. Make sure you discuss any questions you have with your health care provider. Document Released: 03/14/2008 Document Revised: 03/03/2016 Document Reviewed: 01/21/2015 Elsevier Interactive Patient Education  2018 [place] for Gastroesophageal Reflux Disease, Adult When you have gastroesophageal reflux disease (GERD), the foods you eat and your eating habits are very important. Choosing the right foods can help ease your discomfort. What  guidelines do I need to follow?  Choose fruits, vegetables, whole grains, and low-fat dairy products.  Choose low-fat meat, fish, and poultry.  Limit fats such as oils, salad dressings, butter, nuts, and avocado.  Keep a food diary. This helps you identify foods that cause symptoms.  Avoid foods that cause symptoms. These may be different for everyone.  Eat small meals  often instead of 3 large meals a day.  Eat your meals slowly, in a place where you are relaxed.  Limit fried foods.  Cook foods using methods other than frying.  Avoid drinking alcohol.  Avoid drinking large amounts of liquids with your meals.  Avoid bending over or lying down until 2-3 hours after eating. What foods are not recommended? These are some foods and drinks that may make your symptoms worse: Vegetables Tomatoes. Tomato juice. Tomato and spaghetti sauce. Chili peppers. Onion and garlic. Horseradish. Fruits Oranges, grapefruit, and lemon (fruit and juice). Meats High-fat meats, fish, and poultry. This includes hot dogs, ribs, ham, sausage, salami, and bacon. Dairy Whole milk and chocolate milk. Sour cream. Cream. Butter. Ice cream. Cream cheese. Drinks Coffee and tea. Bubbly (carbonated) drinks or energy drinks. Condiments Hot sauce. Barbecue sauce. Sweets/Desserts Chocolate and cocoa. Donuts. Peppermint and spearmint. Fats and Oils High-fat foods. This includes [place] fries and potato chips. Other Vinegar. Strong spices. This includes black pepper, white pepper, red pepper, cayenne, curry powder, cloves, ginger, and chili powder. The items listed above may not be a complete list of foods and drinks to avoid. Contact your dietitian for more information. This information is not intended to replace advice given to you by your health care provider. Make sure you discuss any questions you have with your health care provider. Document Released: 03/27/2012 Document Revised: 03/03/2016 Document Reviewed: 07/31/2013 Elsevier Interactive Patient Education  2017 [employer].

## 2018-09-13 NOTE — Assessment & Plan Note (Signed)
Switching to Protonix 40 twice a day. No eating within 2 to 3 hours of bedtime. Checking labs, CBC, CMP, lipase, amylase. She is post cholecystectomy. I do think we need an upper GI series for evaluation of esophageal webs, achalasia. If insufficient relief with the above treatment we will refer for upper endoscopy.

## 2018-09-13 NOTE — Progress Notes (Addendum)
Subjective:    CC: Vomiting and nausea  HPI: This is a pleasant 47 year old female, she is post laparoscopic cholecystectomy in May.  For the past few months she has had worsening episodes of refluxing food eaten previously, worse when laying flat at night, she has significant sour brash, throat clearing, mucus production.  Hoarseness.  Denies any melena or hematochezia, hematemesis.  I reviewed the past medical history, family history, social history, surgical history, and allergies today and no changes were needed.  Please see the problem list section below in epic for further details.  Past Medical History: Past Medical History:  Diagnosis Date  . Anxiety   . Arthritis    back   . Asthma    childhood  . GERD (gastroesophageal reflux disease)   . Hypertension    Past Surgical History: Past Surgical History:  Procedure Laterality Date  . CESAREAN SECTION    . CHOLECYSTECTOMY N/A 03/06/2018   Procedure: LAPAROSCOPIC CHOLECYSTECTOMY ERAS PATHWAY;  Surgeon: Clovis Riley, MD;  Location: [facility];  Service: General;  Laterality: N/A;   Social History: Social History   Socioeconomic History  . Marital status: Divorced    Spouse name: Not on file  . Number of children: Not on file  . Years of education: Not on file  . Highest education level: Not on file  Occupational History  . Not on file  Social Needs  . Financial resource strain: Not on file  . Food insecurity:    Worry: Not on file    Inability: Not on file  . Transportation needs:    Medical: Not on file    Non-medical: Not on file  Tobacco Use  . Smoking status: Current Every Day Smoker    Packs/day: 0.50  . Smokeless tobacco: Never Used  Substance and Sexual Activity  . Alcohol use: Yes    Comment: weekends   . Drug use: No  . Sexual activity: Not on file  Lifestyle  . Physical activity:    Days per week: Not on file    Minutes per session: Not on file  . Stress: Not on file  Relationships  . Social  connections:    Talks on phone: Not on file    Gets together: Not on file    Attends religious service: Not on file    Active member of club or organization: Not on file    Attends meetings of clubs or organizations: Not on file    Relationship status: Not on file  Other Topics Concern  . Not on file  Social History Narrative  . Not on file   Family History: Family History  Problem Relation Age of Onset  . Asthma Mother   . Heart failure Father   . Asthma Sister   . Asthma Brother   . Diabetes Neg Hx    Allergies: Allergies  Allergen Reactions  . Acetaminophen-Codeine Other (See Comments)    Shortness of breath. This was the codeine component, not tylenol.  . Iodine Solution [Povidone Iodine] Rash  . Oxycodone Itching   Medications: See med rec.  Review of Systems: No fevers, chills, night sweats, weight loss, chest pain, or shortness of breath.   Objective:    General: Well Developed, well nourished, and in no acute distress.  Neuro: Alert and oriented x3, extra-ocular muscles intact, sensation grossly intact.  HEENT: Normocephalic, atraumatic, pupils equal round reactive to light, neck supple, no masses, no lymphadenopathy, thyroid nonpalpable.  Skin: Warm and dry, no rashes.  Cardiac: Regular rate and rhythm, no murmurs rubs or gallops, no lower extremity edema.  Respiratory: Clear to auscultation bilaterally. Not using accessory muscles, speaking in full sentences. Abdominal: Tender to palpation in the epigastrium, nondistended, no bowel sounds, no palpable masses, no guarding, rigidity.  Impression and Recommendations:    Gastro-esophageal reflux Switching to Protonix 40 twice a day. No eating within 2 to 3 hours of bedtime. Checking labs, CBC, CMP, lipase, amylase. She is post cholecystectomy. I do think we need an upper GI series for evaluation of esophageal webs, achalasia. If insufficient relief with the above treatment we will refer for upper  endoscopy.  Transaminitis There is significant elevation in the liver function tests. For this we are also going to add an abdominal ultrasound, as well as hepatitis further testing.   I am going to add the additional hepatitis labs. ___________________________________________ Gwen Her. [name], M.D., ABFM., CAQSM. Primary Care and Sports Medicine [facility]  Adjunct Professor of [place] of [facility] [employer]

## 2018-09-14 DIAGNOSIS — R7401 Elevation of levels of liver transaminase levels: Secondary | ICD-10-CM | POA: Insufficient documentation

## 2018-09-14 DIAGNOSIS — R740 Nonspecific elevation of levels of transaminase and lactic acid dehydrogenase [LDH]: Secondary | ICD-10-CM

## 2018-09-14 LAB — AMYLASE: Amylase: 46 U/L (ref 21–101)

## 2018-09-14 LAB — CBC
HCT: 45.2 % — ABNORMAL HIGH (ref 35.0–45.0)
Hemoglobin: 15.4 g/dL (ref 11.7–15.5)
MCH: 29.7 pg (ref 27.0–33.0)
MCHC: 34.1 g/dL (ref 32.0–36.0)
MCV: 87.1 fL (ref 80.0–100.0)
MPV: 11.3 fL (ref 7.5–12.5)
Platelets: 259 10*3/uL (ref 140–400)
RBC: 5.19 Million/uL — ABNORMAL HIGH (ref 3.80–5.10)
RDW: 14.4 % (ref 11.0–15.0)
WBC: 4.5 Thousand/uL (ref 3.8–10.8)

## 2018-09-14 LAB — COMPREHENSIVE METABOLIC PANEL
AG Ratio: 1.2 (calc) (ref 1.0–2.5)
ALT: 88 U/L — ABNORMAL HIGH (ref 6–29)
AST: 133 U/L — ABNORMAL HIGH (ref 10–35)
Albumin: 4.1 g/dL (ref 3.6–5.1)
Alkaline phosphatase (APISO): 129 U/L — ABNORMAL HIGH (ref 33–115)
BUN: 9 mg/dL (ref 7–25)
CO2: 26 mmol/L (ref 20–32)
Calcium: 9.1 mg/dL (ref 8.6–10.2)
Chloride: 104 mmol/L (ref 98–110)
Creat: 0.85 mg/dL (ref 0.50–1.10)
Globulin: 3.4 g/dL (calc) (ref 1.9–3.7)
Glucose, Bld: 82 mg/dL (ref 65–99)
Potassium: 4.1 mmol/L (ref 3.5–5.3)
Sodium: 139 mmol/L (ref 135–146)
Total Bilirubin: 0.8 mg/dL (ref 0.2–1.2)
Total Protein: 7.5 g/dL (ref 6.1–8.1)

## 2018-09-14 LAB — LIPASE: Lipase: 22 U/L (ref 7–60)

## 2018-09-14 NOTE — Addendum Note (Signed)
Addended by: Silverio Decamp on: 09/14/2018 12:59 PM   Modules accepted: Orders

## 2018-09-14 NOTE — Assessment & Plan Note (Signed)
There is significant elevation in the liver function tests. For this we are also going to add an abdominal ultrasound, as well as hepatitis further testing.   I am going to add the additional hepatitis labs.

## 2018-09-19 ENCOUNTER — Ambulatory Visit (INDEPENDENT_AMBULATORY_CARE_PROVIDER_SITE_OTHER): Payer: BLUE CROSS/BLUE SHIELD

## 2018-09-19 DIAGNOSIS — R7401 Elevation of levels of liver transaminase levels: Secondary | ICD-10-CM

## 2018-09-19 DIAGNOSIS — K7689 Other specified diseases of liver: Secondary | ICD-10-CM | POA: Diagnosis not present

## 2018-09-19 DIAGNOSIS — R740 Nonspecific elevation of levels of transaminase and lactic acid dehydrogenase [LDH]: Secondary | ICD-10-CM

## 2018-09-20 ENCOUNTER — Encounter: Payer: Self-pay | Admitting: Sports Medicine

## 2018-09-24 ENCOUNTER — Ambulatory Visit (HOSPITAL_COMMUNITY)
Admission: RE | Admit: 2018-09-24 | Discharge: 2018-09-24 | Disposition: A | Discharge status: Home / Self Care | Payer: BLUE CROSS/BLUE SHIELD | Source: Ambulatory Visit | Attending: Sports Medicine | Admitting: Sports Medicine

## 2018-09-24 DIAGNOSIS — K219 Gastro-esophageal reflux disease without esophagitis: Secondary | ICD-10-CM | POA: Insufficient documentation

## 2018-09-24 DIAGNOSIS — K3189 Other diseases of stomach and duodenum: Secondary | ICD-10-CM | POA: Diagnosis not present

## 2018-09-25 ENCOUNTER — Ambulatory Visit: Payer: BLUE CROSS/BLUE SHIELD | Admitting: Sports Medicine

## 2018-09-25 ENCOUNTER — Encounter: Payer: Self-pay | Admitting: Sports Medicine

## 2018-09-25 DIAGNOSIS — K219 Gastro-esophageal reflux disease without esophagitis: Secondary | ICD-10-CM | POA: Diagnosis not present

## 2018-09-25 DIAGNOSIS — Z6839 Body mass index (BMI) 39.0-39.9, adult: Secondary | ICD-10-CM

## 2018-09-25 DIAGNOSIS — R7401 Elevation of levels of liver transaminase levels: Secondary | ICD-10-CM

## 2018-09-25 DIAGNOSIS — R740 Nonspecific elevation of levels of transaminase and lactic acid dehydrogenase [LDH]: Secondary | ICD-10-CM

## 2018-09-25 MED ORDER — PHENTERMINE HCL 37.5 MG PO TABS: ORAL_TABLET | ORAL | 0 refills | Status: DC

## 2018-09-25 MED ORDER — LORAZEPAM 0.5 MG PO TABS: 0.5000 mg | ORAL_TABLET | Freq: Every day | ORAL | 0 refills | Status: DC | PRN

## 2018-09-25 NOTE — Assessment & Plan Note (Signed)
Doing well with Protonix twice a day, avoiding eating within 2 to 3 hours of bedtime. Postcholecystectomy. Upper GI series showed no evidence of esophageal webs, achalasia, but she did have some evidence of antritis.

## 2018-09-25 NOTE — Assessment & Plan Note (Signed)
BMI almost 40, transaminitis with fatty liver disease, hypertension, severe GERD. These are all complications that I think make her a good candidate for consideration of sleeve gastrectomy. Referral placed, in the meantime we are going to get her in with a nutritionist and start some phentermine. Return monthly for weight checks and refills.

## 2018-09-25 NOTE — Assessment & Plan Note (Signed)
Working aggressively on weight loss.

## 2018-09-25 NOTE — Progress Notes (Signed)
Subjective:    CC: Follow-up  HPI: GERD: Symptoms improved considerably with Protonix twice a day, and avoiding eating 2 to 3 hours before bedtime.  We did an upper GI series that showed no evidence of achalasia, esophageal webs, only some mild antritis.  Hepatic ultrasound showed some hepatic steatosis.  Anxiety: Needs a refill on Lorazepam.  Obesity: Would like to start weight loss treatment, agreeable to also discuss bariatric surgery.  I reviewed the past medical history, family history, social history, surgical history, and allergies today and no changes were needed.  Please see the problem list section below in epic for further details.  Past Medical History: Past Medical History:  Diagnosis Date  . Anxiety   . Arthritis    back   . Asthma    childhood  . GERD (gastroesophageal reflux disease)   . Hypertension    Past Surgical History: Past Surgical History:  Procedure Laterality Date  . CESAREAN SECTION    . CHOLECYSTECTOMY N/A 03/06/2018   Procedure: LAPAROSCOPIC CHOLECYSTECTOMY ERAS PATHWAY;  Surgeon: Clovis Riley, MD;  Location: [facility];  Service: General;  Laterality: N/A;   Social History: Social History   Socioeconomic History  . Marital status: Divorced    Spouse name: Not on file  . Number of children: Not on file  . Years of education: Not on file  . Highest education level: Not on file  Occupational History  . Not on file  Social Needs  . Financial resource strain: Not on file  . Food insecurity:    Worry: Not on file    Inability: Not on file  . Transportation needs:    Medical: Not on file    Non-medical: Not on file  Tobacco Use  . Smoking status: Current Every Day Smoker    Packs/day: 0.50  . Smokeless tobacco: Never Used  Substance and Sexual Activity  . Alcohol use: Yes    Comment: weekends   . Drug use: No  . Sexual activity: Not on file  Lifestyle  . Physical activity:    Days per week: Not on file    Minutes per session: Not  on file  . Stress: Not on file  Relationships  . Social connections:    Talks on phone: Not on file    Gets together: Not on file    Attends religious service: Not on file    Active member of club or organization: Not on file    Attends meetings of clubs or organizations: Not on file    Relationship status: Not on file  Other Topics Concern  . Not on file  Social History Narrative  . Not on file   Family History: Family History  Problem Relation Age of Onset  . Asthma Mother   . Heart failure Father   . Asthma Sister   . Asthma Brother   . Diabetes Neg Hx    Allergies: Allergies  Allergen Reactions  . Acetaminophen-Codeine Other (See Comments)    Shortness of breath. This was the codeine component, not tylenol.  . Iodine Solution [Povidone Iodine] Rash  . Oxycodone Itching   Medications: See med rec.  Review of Systems: No fevers, chills, night sweats, weight loss, chest pain, or shortness of breath.   Objective:    General: Well Developed, well nourished, and in no acute distress.  Neuro: Alert and oriented x3, extra-ocular muscles intact, sensation grossly intact.  HEENT: Normocephalic, atraumatic, pupils equal round reactive to light, neck supple, no masses,  no lymphadenopathy, thyroid nonpalpable.  Skin: Warm and dry, no rashes. Cardiac: Regular rate and rhythm, no murmurs rubs or gallops, no lower extremity edema.  Respiratory: Clear to auscultation bilaterally. Not using accessory muscles, speaking in full sentences.  Impression and Recommendations:    Obesity BMI almost 40, transaminitis with fatty liver disease, hypertension, severe GERD. These are all complications that I think make her a good candidate for consideration of sleeve gastrectomy. Referral placed, in the meantime we are going to get her in with a nutritionist and start some phentermine. Return monthly for weight checks and refills.  Gastro-esophageal reflux Doing well with Protonix twice a  day, avoiding eating within 2 to 3 hours of bedtime. Postcholecystectomy. Upper GI series showed no evidence of esophageal webs, achalasia, but she did have some evidence of antritis.   Transaminitis Working aggressively on weight loss. ___________________________________________ Gwen Her. [name], M.D., ABFM., CAQSM. Primary Care and Sports Medicine [facility]  Adjunct Professor of [place] of [facility] [employer]

## 2018-10-24 ENCOUNTER — Ambulatory Visit: Payer: BLUE CROSS/BLUE SHIELD | Admitting: Sports Medicine

## 2018-11-05 ENCOUNTER — Other Ambulatory Visit: Payer: Self-pay

## 2018-11-05 ENCOUNTER — Emergency Department
Admission: EM | Admit: 2018-11-05 | Discharge: 2018-11-05 | Disposition: A | Discharge status: Home / Self Care | Payer: BLUE CROSS/BLUE SHIELD | Source: Home / Self Care | Attending: Family Medicine | Admitting: Family Medicine

## 2018-11-05 ENCOUNTER — Encounter: Payer: Self-pay | Admitting: Emergency Medicine

## 2018-11-05 DIAGNOSIS — R110 Nausea: Secondary | ICD-10-CM

## 2018-11-05 DIAGNOSIS — R69 Illness, unspecified: Secondary | ICD-10-CM | POA: Diagnosis not present

## 2018-11-05 DIAGNOSIS — J111 Influenza due to unidentified influenza virus with other respiratory manifestations: Secondary | ICD-10-CM

## 2018-11-05 MED ORDER — OSELTAMIVIR PHOSPHATE 75 MG PO CAPS: 75.0000 mg | ORAL_CAPSULE | Freq: Two times a day (BID) | ORAL | 0 refills | Status: DC

## 2018-11-05 MED ORDER — ONDANSETRON 4 MG PO TBDP
4.0000 mg | ORAL_TABLET | Freq: Once | ORAL | Status: AC
  Administered 2018-11-05: 4 mg via ORAL

## 2018-11-05 MED ORDER — ONDANSETRON 4 MG PO TBDP: ORAL_TABLET | ORAL | 0 refills | Status: DC
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
## 2018-11-05 NOTE — ED Triage Notes (Signed)
Fatigue x 3 days, this am nausea, vomited x 2, numbness in lips and fingers, fatigue, chills.

## 2018-11-05 NOTE — Discharge Instructions (Addendum)
Take plain guaifenesin (1227m extended release tabs such as Mucinex) twice daily, with plenty of water, for cough and congestion.  Get adequate rest.   For nasal congestion, may use Afrin nasal spray (or generic oxymetazoline) each morning for about 5 days and then discontinue.  Also recommend using saline nasal spray several times daily and saline nasal irrigation (AYR is a common brand).  Try warm salt water gargles for sore throat.  Stop all antihistamines for now, and other non-prescription cough/cold preparations. May take Tylenol if needed for fever, body aches, etc. May take Delsym Cough Suppressant at bedtime for nighttime cough.   If symptoms become significantly worse during the night or over the weekend, proceed to the local emergency room.

## 2018-12-07 ENCOUNTER — Ambulatory Visit (INDEPENDENT_AMBULATORY_CARE_PROVIDER_SITE_OTHER): Payer: BLUE CROSS/BLUE SHIELD | Admitting: Sports Medicine

## 2018-12-07 DIAGNOSIS — Z6839 Body mass index (BMI) 39.0-39.9, adult: Secondary | ICD-10-CM | POA: Diagnosis not present

## 2018-12-07 MED ORDER — PHENTERMINE HCL 37.5 MG PO TABS: ORAL_TABLET | ORAL | 0 refills | Status: DC

## 2018-12-07 NOTE — Assessment & Plan Note (Signed)
Transaminitis with fatty liver disease, hypertension, severe GERD. We did a referral for sleeve gastrectomy as well as nutritionist at the last visit. Refilling phentermine. Return monthly for weight checks and refills, entering the second month.

## 2018-12-07 NOTE — Progress Notes (Signed)
Subjective:    CC: Weight check  HPI: This is a pleasant 48 year old female, she returns for a weight check, it is been about 2-1/2 months since I seen her last, she was given only 1 month of phentermine but has been able to lose weight.  No adverse effects, would like to continue.  I reviewed the past medical history, family history, social history, surgical history, and allergies today and no changes were needed.  Please see the problem list section below in epic for further details.  Past Medical History: Past Medical History:  Diagnosis Date  . Anxiety   . Arthritis    back   . Asthma    childhood  . GERD (gastroesophageal reflux disease)   . Hypertension    Past Surgical History: Past Surgical History:  Procedure Laterality Date  . CESAREAN SECTION    . CHOLECYSTECTOMY N/A 03/06/2018   Procedure: LAPAROSCOPIC CHOLECYSTECTOMY ERAS PATHWAY;  Surgeon: Clovis Riley, MD;  Location: [facility];  Service: General;  Laterality: N/A;  . tubaligation     Social History: Social History   Socioeconomic History  . Marital status: Divorced    Spouse name: Not on file  . Number of children: Not on file  . Years of education: Not on file  . Highest education level: Not on file  Occupational History  . Not on file  Social Needs  . Financial resource strain: Not on file  . Food insecurity:    Worry: Not on file    Inability: Not on file  . Transportation needs:    Medical: Not on file    Non-medical: Not on file  Tobacco Use  . Smoking status: Current Every Day Smoker    Packs/day: 0.50  . Smokeless tobacco: Never Used  Substance and Sexual Activity  . Alcohol use: Yes    Comment: weekends   . Drug use: No  . Sexual activity: Not on file  Lifestyle  . Physical activity:    Days per week: Not on file    Minutes per session: Not on file  . Stress: Not on file  Relationships  . Social connections:    Talks on phone: Not on file    Gets together: Not on file   Attends religious service: Not on file    Active member of club or organization: Not on file    Attends meetings of clubs or organizations: Not on file    Relationship status: Not on file  Other Topics Concern  . Not on file  Social History Narrative  . Not on file   Family History: Family History  Problem Relation Age of [place]  . Asthma Mother   . Heart failure Father   . Asthma Sister   . Asthma Brother   . Diabetes Neg Hx    Allergies: Allergies  Allergen Reactions  . Acetaminophen-Codeine Other (See Comments)    Shortness of breath. This was the codeine component, not tylenol.  . Iodine Solution [Povidone Iodine] Rash  . Oxycodone Itching   Medications: See med rec.  Review of Systems: No fevers, chills, night sweats, weight loss, chest pain, or shortness of breath.   Objective:    General: Well Developed, well nourished, and in no acute distress.  Neuro: Alert and oriented x3, extra-ocular muscles intact, sensation grossly intact.  HEENT: Normocephalic, atraumatic, pupils equal round reactive to light, neck supple, no masses, no lymphadenopathy, thyroid nonpalpable.  Skin: Warm and dry, no rashes. Cardiac: Regular rate and rhythm,  no murmurs rubs or gallops, no lower extremity edema.  Respiratory: Clear to auscultation bilaterally. Not using accessory muscles, speaking in full sentences.  Impression and Recommendations:    Obesity Transaminitis with fatty liver disease, hypertension, severe GERD. We did a referral for sleeve gastrectomy as well as nutritionist at the last visit. Refilling phentermine. Return monthly for weight checks and refills, entering the second month.  ___________________________________________ Gwen Her. [name], M.D., ABFM., CAQSM. Primary Care and Sports Medicine [facility]  Adjunct Professor of [place] [employer] [facility] [employer]

## 2018-12-26 ENCOUNTER — Other Ambulatory Visit: Payer: Self-pay | Admitting: *Deleted

## 2018-12-26 ENCOUNTER — Ambulatory Visit: Payer: BLUE CROSS/BLUE SHIELD | Admitting: Family Medicine

## 2018-12-26 ENCOUNTER — Encounter: Payer: Self-pay | Admitting: Family Medicine

## 2018-12-26 ENCOUNTER — Other Ambulatory Visit: Payer: Self-pay

## 2018-12-26 ENCOUNTER — Ambulatory Visit: Payer: BLUE CROSS/BLUE SHIELD | Admitting: Sports Medicine

## 2018-12-26 ENCOUNTER — Encounter: Payer: Self-pay | Admitting: Sports Medicine

## 2018-12-26 VITALS — BP 141/97 | HR 84 | Temp 98.2°F | Ht 65.0 in | Wt 230.0 lb

## 2018-12-26 DIAGNOSIS — J301 Allergic rhinitis due to pollen: Secondary | ICD-10-CM

## 2018-12-26 DIAGNOSIS — J019 Acute sinusitis, unspecified: Secondary | ICD-10-CM | POA: Diagnosis not present

## 2018-12-26 MED ORDER — FLUTICASONE PROPIONATE 50 MCG/ACT NA SUSP: 1.0000 | Freq: Every day | NASAL | 12 refills | Status: DC

## 2018-12-26 MED ORDER — AMOXICILLIN-POT CLAVULANATE 875-125 MG PO TABS: 1.0000 | ORAL_TABLET | Freq: Two times a day (BID) | ORAL | 0 refills | Status: DC

## 2018-12-26 MED ORDER — LORAZEPAM 0.5 MG PO TABS: 0.5000 mg | ORAL_TABLET | Freq: Every day | ORAL | 0 refills | Status: DC | PRN

## 2018-12-26 MED ORDER — FLUCONAZOLE 150 MG PO TABS: 150.0000 mg | ORAL_TABLET | Freq: Once | ORAL | 0 refills | Status: AC

## 2018-12-26 NOTE — Progress Notes (Signed)
Acute Office Visit  Subjective:    Patient ID: Heather Salazar, female    DOB: 09-May-1971, 48 y.o.   MRN: [id number]  Chief Complaint  Patient presents with  . Cough    x 3 days she reports that it is worse @ night. she c/o runny nose, bloody d/c in the mornings when she blows, and coughing up bloody sputum. she also reports having a headache that is at the top of her head  . Fever    she had temp of 100 @ 3 AM took advil    HPI Patient is in today for cough x 3 days x 3 days she reports that it is worse @ night.  Starting last week she noticed that her typical seasonal allergy symptoms were increasing she was experiencing a runny nose, and some nasal congestion.  Then by Sunday she was noticing some bloody nasal discharge. She has not coughing and has noticed blood streaks in the sputum.  She is also been having a headache on the top of her head.  She said she woke up around 3 AM this morning and felt bad like she was having some chills and sweats and so checked her temperature and it was right around 100.  She took some ibuprofen.  She works in [employer].    Past Medical History:  Diagnosis Date  . Anxiety   . Arthritis    back   . Asthma    childhood  . GERD (gastroesophageal reflux disease)   . Hypertension     Past Surgical History:  Procedure Laterality Date  . CESAREAN SECTION    . CHOLECYSTECTOMY N/A 03/06/2018   Procedure: LAPAROSCOPIC CHOLECYSTECTOMY ERAS PATHWAY;  Surgeon: Clovis Riley, MD;  Location: [facility];  Service: General;  Laterality: N/A;  . tubaligation      Family History  Problem Relation Age of Onset  . Asthma Mother   . Heart failure Father   . Asthma Sister   . Asthma Brother   . Diabetes Neg Hx     Social History   Socioeconomic History  . Marital status: Divorced    Spouse name: Not on file  . Number of children: Not on file  . Years of education: Not on file  . Highest education level: Not on file  Occupational History  . Not  on file  Social Needs  . Financial resource strain: Not on file  . Food insecurity:    Worry: Not on file    Inability: Not on file  . Transportation needs:    Medical: Not on file    Non-medical: Not on file  Tobacco Use  . Smoking status: Current Every Day Smoker    Packs/day: 0.50  . Smokeless tobacco: Never Used  Substance and Sexual Activity  . Alcohol use: Yes    Comment: weekends   . Drug use: No  . Sexual activity: Not on file  Lifestyle  . Physical activity:    Days per week: Not on file    Minutes per session: Not on file  . Stress: Not on file  Relationships  . Social connections:    Talks on phone: Not on file    Gets together: Not on file    Attends religious service: Not on file    Active member of club or organization: Not on file    Attends meetings of clubs or organizations: Not on file    Relationship status: Not on file  . Intimate  partner violence:    Fear of current or ex partner: Not on file    Emotionally abused: Not on file    Physically abused: Not on file    Forced sexual activity: Not on file  Other Topics Concern  . Not on file  Social History Narrative  . Not on file    Outpatient Medications Prior to Visit  Medication Sig Dispense Refill  . OVER THE COUNTER MEDICATION Take 1 packet by mouth daily as needed (headache). Goody's Powder    . pantoprazole (PROTONIX) 40 MG tablet Take 1 tablet (40 mg total) by mouth daily. 30 tablet 3  . phentermine (ADIPEX-P) 37.5 MG tablet One tab by mouth qAM 30 tablet 0  . valsartan-hydrochlorothiazide (DIOVAN-HCT) 320-25 MG tablet Take 0.5 tablets by mouth daily. 30 tablet 3  . LORazepam (ATIVAN) 0.5 MG tablet Take 1 tablet (0.5 mg total) by mouth daily as needed for anxiety. 45 tablet 0  . fluticasone (FLONASE) 50 MCG/ACT nasal spray Place 1 spray into both nostrils daily.    . ondansetron (ZOFRAN ODT) 4 MG disintegrating tablet Take one tab by mouth Q6hr prn nausea.  Dissolve under tongue. 12 tablet 0   . oseltamivir (TAMIFLU) 75 MG capsule Take 1 capsule (75 mg total) by mouth every 12 (twelve) hours. 10 capsule 0   No facility-administered medications prior to visit.     Allergies  Allergen Reactions  . Acetaminophen-Codeine Other (See Comments)    Shortness of breath. This was the codeine component, not tylenol.  . Iodine Solution [Povidone Iodine] Rash  . Oxycodone Itching    ROS     Objective:    Physical Exam  Constitutional: She is oriented to person, place, and time. She appears well-developed and well-nourished.  HENT:  Head: Normocephalic and atraumatic.  Right Ear: External ear normal.  Left Ear: External ear normal.  Nose: Nose normal.  Mouth/Throat: Oropharynx is clear and moist.  TMs and canals are clear.   Eyes: Pupils are equal, round, and reactive to light. Conjunctivae and EOM are normal.  Neck: Neck supple. No thyromegaly present.  Cardiovascular: Normal rate, regular rhythm and normal heart sounds.  Pulmonary/Chest: Effort normal and breath sounds normal. She has no wheezes.  Lymphadenopathy:    She has no cervical adenopathy.  Neurological: She is alert and oriented to person, place, and time.  Skin: Skin is warm and dry.  Psychiatric: She has a normal mood and affect.    BP (!) 141/97   Pulse 84   Temp 98.2 F (36.8 C)   Ht 5' 5"  (1.651 m)   Wt 230 lb (104.3 kg)   SpO2 100%   BMI 38.27 kg/m  Wt Readings from Last 3 Encounters:  12/26/18 230 lb (104.3 kg)  12/07/18 229 lb (103.9 kg)  11/05/18 230 lb (104.3 kg)    There are no preventive care reminders to display for this patient.  There are no preventive care reminders to display for this patient.   Lab Results  Component Value Date   TSH 1.732 01/19/2015   Lab Results  Component Value Date   WBC 4.5 09/13/2018   HGB 15.4 09/13/2018   HCT 45.2 (H) 09/13/2018   MCV 87.1 09/13/2018   PLT 259 09/13/2018   Lab Results  Component Value Date   NA 139 09/13/2018   K 4.1  09/13/2018   CO2 26 09/13/2018   GLUCOSE 82 09/13/2018   BUN 9 09/13/2018   CREATININE 0.85 09/13/2018   BILITOT  0.8 09/13/2018   ALKPHOS 79 01/19/2015   AST 133 (H) 09/13/2018   ALT 88 (H) 09/13/2018   PROT 7.5 09/13/2018   ALBUMIN 4.0 01/19/2015   CALCIUM 9.1 09/13/2018   ANIONGAP 12 03/06/2018   Lab Results  Component Value Date   CHOL 171 01/19/2015   Lab Results  Component Value Date   HDL 61 01/19/2015   Lab Results  Component Value Date   LDLCALC 88 01/19/2015   Lab Results  Component Value Date   TRIG 109 01/19/2015   Lab Results  Component Value Date   CHOLHDL 2.8 01/19/2015   Lab Results  Component Value Date   HGBA1C 5.8 (H) 01/19/2015       Assessment & Plan:   Problem List Items Addressed This Visit    None    Visit Diagnoses    Acute non-recurrent sinusitis, unspecified location    -  Primary   Relevant Medications   amoxicillin-clavulanate (AUGMENTIN) 875-125 MG tablet   fluconazole (DIFLUCAN) 150 MG tablet   fluticasone (FLONASE) 50 MCG/ACT nasal spray   Seasonal allergic rhinitis due to pollen         Acute sinusitis with seasonal allergies.  We will go ahead and treat with Augmentin.  Recommend that she use a humidifier at night to reduce cracking and bleeding inside of the nose.  Avoid blowing the nose hard or heavily.  Call if not better by Monday.  Work note provided.  They to continue anti-inflammatory or Tylenol as needed for fever.   Allergic rhinitis, seasonal-discussed getting back on her Flonase in fact I will send that over as a prescription to see if it might be cheaper than the over-the-counter.  Also encouraged her to switch to a 24 acting antihistamine such as Zyrtec and then if she needs to use her Benadryl for breakthrough she certainly can.  Meds ordered this encounter  Medications  . amoxicillin-clavulanate (AUGMENTIN) 875-125 MG tablet    Sig: Take 1 tablet by mouth 2 (two) times daily.    Dispense:  14 tablet     Refill:  0  . fluconazole (DIFLUCAN) 150 MG tablet    Sig: Take 1 tablet (150 mg total) by mouth once for 1 dose.    Dispense:  1 tablet    Refill:  0  . fluticasone (FLONASE) 50 MCG/ACT nasal spray    Sig: Place 1-2 sprays into both nostrils daily.    Dispense:  16 g    Refill:  12     Beatrice Lecher, MD

## 2018-12-26 NOTE — Telephone Encounter (Signed)
Pt was seen by Dr. Madilyn Fireman for an Acute visit today and asked for a refill. I told her that I would send her request to Dr. [name] for her.Marland KitchenMarland KitchenElouise Munroe, [place]

## 2018-12-31 ENCOUNTER — Telehealth: Payer: BLUE CROSS/BLUE SHIELD | Admitting: Family

## 2018-12-31 ENCOUNTER — Other Ambulatory Visit: Payer: Self-pay

## 2018-12-31 ENCOUNTER — Encounter: Payer: Self-pay | Admitting: Sports Medicine

## 2018-12-31 ENCOUNTER — Ambulatory Visit: Payer: BLUE CROSS/BLUE SHIELD | Admitting: Sports Medicine

## 2018-12-31 ENCOUNTER — Ambulatory Visit (INDEPENDENT_AMBULATORY_CARE_PROVIDER_SITE_OTHER): Payer: BLUE CROSS/BLUE SHIELD

## 2018-12-31 DIAGNOSIS — R059 Cough, unspecified: Secondary | ICD-10-CM

## 2018-12-31 DIAGNOSIS — R05 Cough: Secondary | ICD-10-CM

## 2018-12-31 DIAGNOSIS — R0602 Shortness of breath: Secondary | ICD-10-CM

## 2018-12-31 MED ORDER — HYDROCOD POLST-CPM POLST ER 10-8 MG/5ML PO SUER
5.0000 mL | Freq: Two times a day (BID) | ORAL | 0 refills | Status: DC | PRN
Start: 2018-12-31 — End: 2019-02-19

## 2018-12-31 MED ORDER — AZITHROMYCIN 250 MG PO TABS
ORAL_TABLET | ORAL | 0 refills | Status: DC
Start: 2018-12-31 — End: 2019-02-18

## 2018-12-31 NOTE — Progress Notes (Signed)
Subjective:    CC: Shortness of breath  HPI: This is a pleasant 48 year old female, she was seen and diagnosed with acute maxillary sinusitis and aggressively treated with Augmentin, Flonase.  She had a slight improvement but then developed some shortness of breath, worse at night.  Cough was productive of yellowish sputum, no fevers, no chills, no myalgias.  She did an ED visit and based on Salazar symptoms was advised to present for an in person visit.  Symptoms are moderate, persistent.  Mild shortness of breath is the main complaint, continues to have no fevers, chills, muscle aches, body aches.  I reviewed the past medical history, family history, social history, surgical history, and allergies today and no changes were needed.  Please see the problem list section below in epic for further details.  Past Medical History: Past Medical History:  Diagnosis Date  . Anxiety   . Arthritis    back   . Asthma    childhood  . GERD (gastroesophageal reflux disease)   . Hypertension    Past Surgical History: Past Surgical History:  Procedure Laterality Date  . CESAREAN SECTION    . CHOLECYSTECTOMY N/A 03/06/2018   Procedure: LAPAROSCOPIC CHOLECYSTECTOMY ERAS PATHWAY;  Surgeon: Clovis Riley, MD;  Location: [facility];  Service: General;  Laterality: N/A;  . tubaligation     Social History: Social History   Socioeconomic History  . Marital status: Divorced    Spouse name: Not on file  . Number of children: Not on file  . Years of education: Not on file  . Highest education level: Not on file  Occupational History  . Not on file  Social Needs  . Financial resource strain: Not on file  . Food insecurity:    Worry: Not on file    Inability: Not on file  . Transportation needs:    Medical: Not on file    Non-medical: Not on file  Tobacco Use  . Smoking status: Current Every Day Smoker    Packs/day: 0.50  . Smokeless tobacco: Never Used  Substance and Sexual Activity  . Alcohol  use: Yes    Comment: weekends   . Drug use: No  . Sexual activity: Not on file  Lifestyle  . Physical activity:    Days per week: Not on file    Minutes per session: Not on file  . Stress: Not on file  Relationships  . Social connections:    Talks on phone: Not on file    Gets together: Not on file    Attends religious service: Not on file    Active member of club or organization: Not on file    Attends meetings of clubs or organizations: Not on file    Relationship status: Not on file  Other Topics Concern  . Not on file  Social History Narrative  . Not on file   Family History: Family History  Problem Relation Age of Onset  . Asthma Mother   . Heart failure Father   . Asthma Sister   . Asthma Brother   . Diabetes Neg Hx    Allergies: Allergies  Allergen Reactions  . Acetaminophen-Codeine Other (See Comments)    Shortness of breath. This was the codeine component, not tylenol.  . Iodine Solution [Povidone Iodine] Rash  . Oxycodone Itching   Medications: See med rec.  Review of Systems: No fevers, chills, night sweats, weight loss, chest pain, or shortness of breath.   Objective:    General:  Well Developed, well nourished, and in no acute distress.  Neuro: Alert and oriented x3, extra-ocular muscles intact, sensation grossly intact.  HEENT: Normocephalic, atraumatic, pupils equal round reactive to light, neck supple, no masses, no lymphadenopathy, thyroid nonpalpable.  Oropharynx, nasopharynx, ear canals unremarkable. Skin: Warm and dry, no rashes. Cardiac: Regular rate and rhythm, no murmurs rubs or gallops, no lower extremity edema.  Respiratory: Coarse sounds and crackles in the left lower lobe. Not using accessory muscles, speaking in full sentences.  Impression and Recommendations:    Coughing Improving slightly after treatment for maxillary sinusitis with Augmentin. Unfortunately she has developed some shortness of breath, coughing, yellowish sputum. She  has left lower lobe crackles. Switching to azithromycin and adding Tussionex, she has documented allergies to codeine and oxycodone but tells me she has done well with hydrocodone in the past for cough. Adding a chest x-ray. She has no fevers, no chills.   ___________________________________________ Heather Salazar. [name], M.D., ABFM., CAQSM. Primary Care and Sports Medicine [facility]  Adjunct Professor of [place] [employer] [facility] [employer]

## 2018-12-31 NOTE — Progress Notes (Signed)
Based on what you shared with me, I feel your condition warrants further evaluation and I recommend that you be seen for a face to face office visit.     NOTE: If you entered your credit card information for this eVisit, you will not be charged. You may see a "hold" on your card for the $35 but that hold will drop off and you will not have a charge processed.  If you are having a true medical emergency please call 911.  If you need an urgent face to face visit, [place] has four urgent care centers for your convenience.    PLEASE NOTE: THE [facility]STAC[facility] LOCATIONS AND URGENT CARE CLINICS DO NOT HAVE THE TESTING FOR CORONAVIRUS COVID19 AVAILABLE.  IF YOU FEEL YOU NEED THIS TEST YOU MUST HAVE AN ORDER TO GO TO A TESTING LOCATION FROM YOUR PROVIDER OR FROM A SCREENING E-VISIT     [email or web address] to reserve your spot online an avoid wait times  [facility] [address], [address] [place], [place] [address] 8 am to 8 pm Monday-Friday 10 am to 4 pm Saturday-Sunday *Across the street from [employer]  [facility]  [address] [place], [address] 8 am to 5 pm Monday-Friday * In the [facility] the [facility]   The following sites will take your insurance:  . [facility]  [phone] Get Driving Directions Find a Provider at this Location  [address] [place], [place] [address] . 10 am to 8 pm Monday-Friday . 12 pm to 8 pm Saturday-Sunday   . [facility]  [phone] Get Driving Directions Find a Provider at this Location  [address], [address] , [place] [address] . 8 am to 8 pm Monday-Friday . 9 am to 6 pm Saturday . 11 am to 6 pm Sunday   .  [facility] [place]  [phone] Get Driving Directions  [address] [place],  [address] . 8 am to 8 pm Monday-Friday . 8 am to 4 pm Saturday-Sunday   Your  e-visit answers were reviewed by a board certified advanced clinical practitioner to complete your personal care plan.  Thank you for using e-Visits. 

## 2018-12-31 NOTE — Assessment & Plan Note (Signed)
Improving slightly after treatment for maxillary sinusitis with Augmentin. Unfortunately she has developed some shortness of breath, coughing, yellowish sputum. She has left lower lobe crackles. Switching to azithromycin and adding Tussionex, she has documented allergies to codeine and oxycodone but tells me she has done well with hydrocodone in the past for cough. Adding a chest x-ray. She has no fevers, no chills.

## 2019-01-04 ENCOUNTER — Other Ambulatory Visit: Payer: Self-pay

## 2019-01-04 ENCOUNTER — Ambulatory Visit (INDEPENDENT_AMBULATORY_CARE_PROVIDER_SITE_OTHER): Payer: BLUE CROSS/BLUE SHIELD | Admitting: Sports Medicine

## 2019-01-04 DIAGNOSIS — Z6839 Body mass index (BMI) 39.0-39.9, adult: Secondary | ICD-10-CM

## 2019-01-04 NOTE — Progress Notes (Signed)
   Patient did not answer after multiple attempts to contact her through WebEx and with multiple attempts of my nurse trying to call her.

## 2019-01-04 NOTE — Progress Notes (Deleted)
Virtual Visit via Telephone   I connected with  Heather Salazar  on 01/04/19 by telephone and verified that I am speaking with the correct person using two identifiers.   I discussed the limitations, risks, security and privacy concerns of performing an evaluation and management service by telephone, including the higher likelihood of inaccurate diagnosis and treatment, and the availability of in person appointments.  We also discussed the likely need of an additional face to face encounter for complete and high quality delivery of care.  I also discussed with the patient that there may be a patient responsible charge related to this service. The patient expressed understanding and wishes to proceed.  Subjective:    CC:   HPI:   I reviewed the past medical history, family history, social history, surgical history, and allergies today and no changes were needed.  Please see the problem list section below in epic for further details.  Past Medical History: Past Medical History:  Diagnosis Date  . Anxiety   . Arthritis    back   . Asthma    childhood  . GERD (gastroesophageal reflux disease)   . Hypertension    Past Surgical History: Past Surgical History:  Procedure Laterality Date  . CESAREAN SECTION    . CHOLECYSTECTOMY N/A 03/06/2018   Procedure: LAPAROSCOPIC CHOLECYSTECTOMY ERAS PATHWAY;  Surgeon: Clovis Riley, MD;  Location: [facility];  Service: General;  Laterality: N/A;  . tubaligation     Social History: Social History   Socioeconomic History  . Marital status: Divorced    Spouse name: Not on file  . Number of children: Not on file  . Years of education: Not on file  . Highest education level: Not on file  Occupational History  . Not on file  Social Needs  . Financial resource strain: Not on file  . Food insecurity:    Worry: Not on file    Inability: Not on file  . Transportation needs:    Medical: Not on file    Non-medical: Not on file  Tobacco Use   . Smoking status: Current Every Day Smoker    Packs/day: 0.50  . Smokeless tobacco: Never Used  Substance and Sexual Activity  . Alcohol use: Yes    Comment: weekends   . Drug use: No  . Sexual activity: Not on file  Lifestyle  . Physical activity:    Days per week: Not on file    Minutes per session: Not on file  . Stress: Not on file  Relationships  . Social connections:    Talks on phone: Not on file    Gets together: Not on file    Attends religious service: Not on file    Active member of club or organization: Not on file    Attends meetings of clubs or organizations: Not on file    Relationship status: Not on file  Other Topics Concern  . Not on file  Social History Narrative  . Not on file   Family History: Family History  Problem Relation Age of Onset  . Asthma Mother   . Heart failure Father   . Asthma Sister   . Asthma Brother   . Diabetes Neg Hx    Allergies: Allergies  Allergen Reactions  . Acetaminophen-Codeine Other (See Comments)    Shortness of breath. This was the codeine component, not tylenol.  . Iodine Solution [Povidone Iodine] Rash  . Oxycodone Itching   Medications: See med rec.  Review  of Systems: No fevers, chills, night sweats, weight loss, chest pain, or shortness of breath.   Objective:    General: Speaking full sentences, no audible heavy breathing.  Sounds alert and appropriately interactive.  No other physical exam performed due to the non-face to face nature of this visit.  Impression and Recommendations:    No problem-specific Assessment & Plan notes found for this encounter.   I discussed the above assessment and treatment plan with the patient. The patient was provided an opportunity to ask questions and all were answered. The patient agreed with the plan and demonstrated an understanding of the instructions.   The patient was advised to call back or seek an in-person evaluation if the symptoms worsen or if the condition  fails to improve as anticipated.   I provided 21 minutes of non-face-to-face time during this encounter, including time needed to gather information, review chart and records, explain the treatment plan to the patient, and complete documentation.   ___________________________________________ Gwen Her. [name], M.D., ABFM., CAQSM. Primary Care and Sports Medicine [facility]  Adjunct Professor of [place] [employer] [facility] [employer]
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
## 2019-02-14 ENCOUNTER — Telehealth: Payer: Self-pay | Admitting: Sports Medicine

## 2019-02-14 ENCOUNTER — Telehealth: Payer: BLUE CROSS/BLUE SHIELD | Admitting: Physician Assistant

## 2019-02-14 DIAGNOSIS — R0602 Shortness of breath: Secondary | ICD-10-CM

## 2019-02-14 DIAGNOSIS — R509 Fever, unspecified: Secondary | ICD-10-CM | POA: Diagnosis not present

## 2019-02-14 DIAGNOSIS — Z03818 Encounter for observation for suspected exposure to other biological agents ruled out: Secondary | ICD-10-CM | POA: Diagnosis not present

## 2019-02-14 DIAGNOSIS — R05 Cough: Secondary | ICD-10-CM | POA: Diagnosis not present

## 2019-02-14 DIAGNOSIS — H538 Other visual disturbances: Secondary | ICD-10-CM

## 2019-02-14 NOTE — Telephone Encounter (Signed)
Spoke with patient. She was seen at one of [facility]'s site for SOB and coughing she was given tessalon perles and an albuterol inhaler and told to f/u with pcp on Monday, May 11th.  Thanks.

## 2019-02-14 NOTE — Telephone Encounter (Signed)
If patient only has slight SOB she can be scheduled for a virtual visit.

## 2019-02-14 NOTE — Telephone Encounter (Signed)
Appointment has been scheduled for Monday May 11th.

## 2019-02-14 NOTE — Progress Notes (Signed)
  E-Visit for [employer] Virus Screening  Based on what you have shared with me, you need to seek an evaluation for a severe illness that is causing your symptoms which may be coronavirus or some other illness. I recommend that you be seen and evaluated "face to face". Our Emergency Departments are best equipped to handle patients with severe symptoms.  You will be evaluated by the ER provider (or higher level of care provider) who will determine whether you need formal testing.   I recommend the following:  . If you are having a true medical emergency please call 911. . If you are considered high risk for Corona virus because of a known exposure, fever, shortness of breath and cough, OR if you have severe symptoms of any kind, seek medical care at an emergency room.   . [facility] Emergency Department [place], [place], [place] [address] [phone]  . [facility] Emergency Department [place], [place], [place] [address] [phone]  . [facility] Emergency Department [place], [place], [place] [address] [phone]  . [facility] Emergency Department [address] [place], [place], [place] [address] [phone]  . [facility] Emergency Department [place], [place], [place] [address] [phone]  NOTE: If you entered your credit card information for this eVisit, you will not be charged. You may see a "hold" on your card for the $35 but that hold will drop off and you will not have a charge processed.   Your e-visit answers were reviewed by a board certified advanced clinical practitioner to complete your personal care plan.  Thank you for using e-Visits.

## 2019-02-14 NOTE — Telephone Encounter (Signed)
Shortness of breath and coughing

## 2019-02-18 ENCOUNTER — Ambulatory Visit (INDEPENDENT_AMBULATORY_CARE_PROVIDER_SITE_OTHER): Payer: BLUE CROSS/BLUE SHIELD | Admitting: Sports Medicine

## 2019-02-18 ENCOUNTER — Encounter: Payer: Self-pay | Admitting: Sports Medicine

## 2019-02-18 DIAGNOSIS — R05 Cough: Secondary | ICD-10-CM | POA: Diagnosis not present

## 2019-02-18 DIAGNOSIS — R059 Cough, unspecified: Secondary | ICD-10-CM

## 2019-02-18 MED ORDER — PREDNISONE 50 MG PO TABS: ORAL_TABLET | ORAL | 0 refills | Status: DC

## 2019-02-18 NOTE — Progress Notes (Signed)
Virtual Visit via Telephone   I connected with  Heather Salazar  on 02/18/19 by telephone/telehealth and verified that I am speaking with the correct person using two identifiers.   I discussed the limitations, risks, security and privacy concerns of performing an evaluation and management service by telephone, including the higher likelihood of inaccurate diagnosis and treatment, and the availability of in person appointments.  We also discussed the likely need of an additional face to face encounter for complete and high quality delivery of care.  I also discussed with the patient that there may be a patient responsible charge related to this service. The patient expressed understanding and wishes to proceed.  Provider location is either at home or medical facility. Patient location is at their home, different from provider location. People involved in care of the patient during this telehealth encounter were myself, my nurse/medical assistant, and my front office/scheduling team member.  Subjective:    CC: Feeling sick  HPI: For the past several days this pleasant 48 year old female has had shortness of breath, wheeze, cough, she tells me a nurse performed auscultation but no provider.  She did have a coronavirus swab at [facility] which was negative.  Unfortunately having persistent symptoms, mild shortness of breath.  She feels as though she can wait till tomorrow to be seen.  I reviewed the past medical history, family history, social history, surgical history, and allergies today and no changes were needed.  Please see the problem list section below in epic for further details.  Past Medical History: Past Medical History:  Diagnosis Date  . Anxiety   . Arthritis    back   . Asthma    childhood  . GERD (gastroesophageal reflux disease)   . Hypertension    Past Surgical History: Past Surgical History:  Procedure Laterality Date  . CESAREAN SECTION    . CHOLECYSTECTOMY N/A  03/06/2018   Procedure: LAPAROSCOPIC CHOLECYSTECTOMY ERAS PATHWAY;  Surgeon: Clovis Riley, MD;  Location: [facility];  Service: General;  Laterality: N/A;  . tubaligation     Social History: Social History   Socioeconomic History  . Marital status: Divorced    Spouse name: Not on file  . Number of children: Not on file  . Years of education: Not on file  . Highest education level: Not on file  Occupational History  . Not on file  Social Needs  . Financial resource strain: Not on file  . Food insecurity:    Worry: Not on file    Inability: Not on file  . Transportation needs:    Medical: Not on file    Non-medical: Not on file  Tobacco Use  . Smoking status: Current Every Day Smoker    Packs/day: 0.50  . Smokeless tobacco: Never Used  Substance and Sexual Activity  . Alcohol use: Yes    Comment: weekends   . Drug use: No  . Sexual activity: Not on file  Lifestyle  . Physical activity:    Days per week: Not on file    Minutes per session: Not on file  . Stress: Not on file  Relationships  . Social connections:    Talks on phone: Not on file    Gets together: Not on file    Attends religious service: Not on file    Active member of club or organization: Not on file    Attends meetings of clubs or organizations: Not on file    Relationship status: Not on file  Other Topics Concern  . Not on file  Social History Narrative  . Not on file   Family History: Family History  Problem Relation Age of Onset  . Asthma Mother   . Heart failure Father   . Asthma Sister   . Asthma Brother   . Diabetes Neg Hx    Allergies: Allergies  Allergen Reactions  . Acetaminophen-Codeine Other (See Comments)    Shortness of breath. This was the codeine component, not tylenol.  . Iodine Solution [Povidone Iodine] Rash  . Oxycodone Itching   Medications: See med rec.  Review of Systems: No fevers, chills, night sweats, weight loss, chest pain, or shortness of breath.    Objective:    General: Speaking full sentences, no audible heavy breathing.  Sounds alert and appropriately interactive.  No other physical exam performed due to the non-face to face nature of this visit.  Impression and Recommendations:    Coughing Increasing cough and wheeze for 1 week now, fevers, chills, muscle aches, body aches. She went to a [facility] coronavirus swab site, her coronavirus swab was negative but they forgot to swab for influenza. Considering her symptoms we are going to add prednisone, and I need to see her tomorrow in person for a physical exam, this telephone junk is not going to cut it.   I discussed the above assessment and treatment plan with the patient. The patient was provided an opportunity to ask questions and all were answered. The patient agreed with the plan and demonstrated an understanding of the instructions.   The patient was advised to call back or seek an in-person evaluation if the symptoms worsen or if the condition fails to improve as anticipated.   I provided 25 minutes of non-face-to-face time during this encounter, 15 minutes of additional time was needed to gather information, review chart, records, communicate/coordinate with staff remotely, and complete documentation.   ___________________________________________ Gwen Her. [name], M.D., ABFM., CAQSM. Primary Care and Sports Medicine [facility]  Adjunct Professor of [place] [employer] [facility] of Medicine

## 2019-02-18 NOTE — Assessment & Plan Note (Addendum)
Increasing cough and wheeze for 1 week now, fevers, chills, muscle aches, body aches. She went to a [facility] coronavirus swab site, her coronavirus swab was negative but they forgot to swab for influenza. Considering her symptoms we are going to add prednisone, and I need to see her tomorrow in person for a physical exam, this telephone junk is not going to cut it.

## 2019-02-19 ENCOUNTER — Other Ambulatory Visit: Payer: Self-pay | Admitting: Sports Medicine

## 2019-02-19 ENCOUNTER — Encounter: Payer: Self-pay | Admitting: Sports Medicine

## 2019-02-19 ENCOUNTER — Ambulatory Visit: Payer: BLUE CROSS/BLUE SHIELD | Admitting: Sports Medicine

## 2019-02-19 ENCOUNTER — Other Ambulatory Visit: Payer: Self-pay

## 2019-02-19 DIAGNOSIS — R05 Cough: Secondary | ICD-10-CM

## 2019-02-19 DIAGNOSIS — R059 Cough, unspecified: Secondary | ICD-10-CM

## 2019-02-19 LAB — POCT INFLUENZA A/B
Influenza A, POC: NEGATIVE
Influenza B, POC: NEGATIVE

## 2019-02-19 MED ORDER — AZITHROMYCIN 250 MG PO TABS: ORAL_TABLET | ORAL | 0 refills | Status: DC

## 2019-02-19 NOTE — Progress Notes (Signed)
Subjective:    CC: Coughing  HPI: This is a pleasant 48 year old female, for the past 2 weeks now she is had cough, shortness of breath, wheezing.  Fatigue.  She did have a COVID-19 swab at an outside facility that was negative.  She has never been swabbed for flu.  Mild myalgias, no overt measured fevers.  She has noted some swollen glands in her axillae and groin.  I reviewed the past medical history, family history, social history, surgical history, and allergies today and no changes were needed.  Please see the problem list section below in epic for further details.  Past Medical History: Past Medical History:  Diagnosis Date  . Anxiety   . Arthritis    back   . Asthma    childhood  . GERD (gastroesophageal reflux disease)   . Hypertension    Past Surgical History: Past Surgical History:  Procedure Laterality Date  . CESAREAN SECTION    . CHOLECYSTECTOMY N/A 03/06/2018   Procedure: LAPAROSCOPIC CHOLECYSTECTOMY ERAS PATHWAY;  Surgeon: Clovis Riley, MD;  Location: [facility];  Service: General;  Laterality: N/A;  . tubaligation     Social History: Social History   Socioeconomic History  . Marital status: Divorced    Spouse name: Not on file  . Number of children: Not on file  . Years of education: Not on file  . Highest education level: Not on file  Occupational History  . Not on file  Social Needs  . Financial resource strain: Not on file  . Food insecurity:    Worry: Not on file    Inability: Not on file  . Transportation needs:    Medical: Not on file    Non-medical: Not on file  Tobacco Use  . Smoking status: Current Every Day Smoker    Packs/day: 0.50  . Smokeless tobacco: Never Used  Substance and Sexual Activity  . Alcohol use: Yes    Comment: weekends   . Drug use: No  . Sexual activity: Not on file  Lifestyle  . Physical activity:    Days per week: Not on file    Minutes per session: Not on file  . Stress: Not on file  Relationships  . Social  connections:    Talks on phone: Not on file    Gets together: Not on file    Attends religious service: Not on file    Active member of club or organization: Not on file    Attends meetings of clubs or organizations: Not on file    Relationship status: Not on file  Other Topics Concern  . Not on file  Social History Narrative  . Not on file   Family History: Family History  Problem Relation Age of Onset  . Asthma Mother   . Heart failure Father   . Asthma Sister   . Asthma Brother   . Diabetes Neg Hx    Allergies: Allergies  Allergen Reactions  . Acetaminophen-Codeine Other (See Comments)    Shortness of breath. This was the codeine component, not tylenol.  . Iodine Solution [Povidone Iodine] Rash  . Oxycodone Itching   Medications: See med rec.  Review of Systems: No fevers, chills, night sweats, weight loss, chest pain, or shortness of breath.   Objective:    General: Well Developed, well nourished, and in no acute distress.  Neuro: Alert and oriented x3, extra-ocular muscles intact, sensation grossly intact.  HEENT: Normocephalic, atraumatic, pupils equal round reactive to light, neck supple,  no masses, no lymphadenopathy, thyroid nonpalpable.  Oropharynx, nasopharynx, ear canals unremarkable. Skin: Warm and dry, no rashes. Cardiac: Regular rate and rhythm, no murmurs rubs or gallops, no lower extremity edema.  Respiratory: Clear to auscultation bilaterally. Not using accessory muscles, speaking in full sentences. Extremities: No axillary or inguinal lymphadenopathy.  Using appropriate precautions I obtained influenza and COVID-19 swabs from the nasopharynx.  Influenza test was negative.  Impression and Recommendations:    Coughing Persistent symptoms, muscle aches, body aches, she tells me she did go to a [facility] facility and had a negative coronavirus swab. We are going to swab her again, I am also testing her for influenza. She should finish her prednisone.  Checking some routine blood work, adding azithromycin, return to see me in 2 weeks.   ___________________________________________ Gwen Her. [name], M.D., ABFM., CAQSM. Primary Care and Sports Medicine [facility]  Adjunct Professor of [place] [employer] [facility] of Medicine

## 2019-02-19 NOTE — Assessment & Plan Note (Addendum)
Persistent symptoms, muscle aches, body aches, she tells me she did go to a [facility] facility and had a negative coronavirus swab. We are going to swab her again, I am also testing her for influenza. She should finish her prednisone. Checking some routine blood work, adding azithromycin, return to see me in 2 weeks.

## 2019-02-20 LAB — SARS-COV-2 RNA,(COVID-19) QUALITATIVE NAAT: SARS CoV2 RNA: NOT DETECTED

## 2019-02-20 MED ORDER — LORAZEPAM 0.5 MG PO TABS: 0.5000 mg | ORAL_TABLET | Freq: Every day | ORAL | 0 refills | Status: DC | PRN

## 2019-02-22 DIAGNOSIS — R05 Cough: Secondary | ICD-10-CM | POA: Diagnosis not present

## 2019-03-02 LAB — CULTURE BLOOD MANUAL
Micro Number: 479145
Result: NO GROWTH
Specimen Quality: ADEQUATE

## 2019-03-02 LAB — COMPLETE METABOLIC PANEL WITHOUT GFR
AG Ratio: 1.3 (calc) (ref 1.0–2.5)
ALT: 46 U/L — ABNORMAL HIGH (ref 6–29)
AST: 70 U/L — ABNORMAL HIGH (ref 10–35)
Albumin: 4 g/dL (ref 3.6–5.1)
Alkaline phosphatase (APISO): 109 U/L (ref 31–125)
BUN: 13 mg/dL (ref 7–25)
CO2: 27 mmol/L (ref 20–32)
Calcium: 9.1 mg/dL (ref 8.6–10.2)
Chloride: 101 mmol/L (ref 98–110)
Creat: 0.92 mg/dL (ref 0.50–1.10)
GFR, Est African American: 87 mL/min/1.73m2
GFR, Est Non African American: 75 mL/min/1.73m2
Globulin: 3.2 g/dL (ref 1.9–3.7)
Glucose, Bld: 78 mg/dL (ref 65–99)
Potassium: 4 mmol/L (ref 3.5–5.3)
Sodium: 137 mmol/L (ref 135–146)
Total Bilirubin: 0.8 mg/dL (ref 0.2–1.2)
Total Protein: 7.2 g/dL (ref 6.1–8.1)

## 2019-03-02 LAB — CBC WITH DIFFERENTIAL/PLATELET
Absolute Monocytes: 683 {cells}/uL (ref 200–950)
Basophils Absolute: 41 {cells}/uL (ref 0–200)
Basophils Relative: 0.4 %
Eosinophils Absolute: 20 {cells}/uL (ref 15–500)
Eosinophils Relative: 0.2 %
HCT: 43.3 % (ref 35.0–45.0)
Hemoglobin: 15 g/dL (ref 11.7–15.5)
Lymphs Abs: 3845 {cells}/uL (ref 850–3900)
MCH: 31.4 pg (ref 27.0–33.0)
MCHC: 34.6 g/dL (ref 32.0–36.0)
MCV: 90.8 fL (ref 80.0–100.0)
MPV: 10.7 fL (ref 7.5–12.5)
Monocytes Relative: 6.7 %
Neutro Abs: 5610 {cells}/uL (ref 1500–7800)
Neutrophils Relative %: 55 %
Platelets: 271 Thousand/uL (ref 140–400)
RBC: 4.77 Million/uL (ref 3.80–5.10)
RDW: 15.4 % — ABNORMAL HIGH (ref 11.0–15.0)
Total Lymphocyte: 37.7 %
WBC: 10.2 Thousand/uL (ref 3.8–10.8)

## 2019-03-05 ENCOUNTER — Ambulatory Visit (INDEPENDENT_AMBULATORY_CARE_PROVIDER_SITE_OTHER): Payer: BLUE CROSS/BLUE SHIELD | Admitting: Sports Medicine

## 2019-03-05 DIAGNOSIS — F5101 Primary insomnia: Secondary | ICD-10-CM

## 2019-03-05 DIAGNOSIS — G4700 Insomnia, unspecified: Secondary | ICD-10-CM | POA: Insufficient documentation

## 2019-03-05 DIAGNOSIS — R05 Cough: Secondary | ICD-10-CM | POA: Diagnosis not present

## 2019-03-05 DIAGNOSIS — R059 Cough, unspecified: Secondary | ICD-10-CM

## 2019-03-05 MED ORDER — TRAZODONE HCL 50 MG PO TABS: 50.0000 mg | ORAL_TABLET | Freq: Every day | ORAL | 3 refills | Status: DC

## 2019-03-05 NOTE — Progress Notes (Signed)
Virtual Visit via Telephone   I connected with  Heather Salazar  on 03/05/19 by telephone/telehealth and verified that I am speaking with the correct person using two identifiers.   I discussed the limitations, risks, security and privacy concerns of performing an evaluation and management service by telephone, including the higher likelihood of inaccurate diagnosis and treatment, and the availability of in person appointments.  We also discussed the likely need of an additional face to face encounter for complete and high quality delivery of care.  I also discussed with the patient that there may be a patient responsible charge related to this service. The patient expressed understanding and wishes to proceed.  Provider location is either at home or medical facility. Patient location is at their home, different from provider location. People involved in care of the patient during this telehealth encounter were myself, my nurse/medical assistant, and my front office/scheduling team member.  Subjective:    CC: Follow-up  HPI: This is a pleasant 48 year old female, her illness has resolved, she has a bit of a dry nonproductive cough but this is continuing to improve.  Insomnia: Present now for several weeks, she tends to wake up to void, and then stays up.  She gets only a couple of hours of sleep per night.  She is not looking at a bright screen, she does not exercise before bedtime.  I reviewed the past medical history, family history, social history, surgical history, and allergies today and no changes were needed.  Please see the problem list section below in epic for further details.  Past Medical History: Past Medical History:  Diagnosis Date  . Anxiety   . Arthritis    back   . Asthma    childhood  . GERD (gastroesophageal reflux disease)   . Hypertension    Past Surgical History: Past Surgical History:  Procedure Laterality Date  . CESAREAN SECTION    . CHOLECYSTECTOMY  N/A 03/06/2018   Procedure: LAPAROSCOPIC CHOLECYSTECTOMY ERAS PATHWAY;  Surgeon: Clovis Riley, MD;  Location: [facility];  Service: General;  Laterality: N/A;  . tubaligation     Social History: Social History   Socioeconomic History  . Marital status: Divorced    Spouse name: Not on file  . Number of children: Not on file  . Years of education: Not on file  . Highest education level: Not on file  Occupational History  . Not on file  Social Needs  . Financial resource strain: Not on file  . Food insecurity:    Worry: Not on file    Inability: Not on file  . Transportation needs:    Medical: Not on file    Non-medical: Not on file  Tobacco Use  . Smoking status: Current Every Day Smoker    Packs/day: 0.50  . Smokeless tobacco: Never Used  Substance and Sexual Activity  . Alcohol use: Yes    Comment: weekends   . Drug use: No  . Sexual activity: Not on file  Lifestyle  . Physical activity:    Days per week: Not on file    Minutes per session: Not on file  . Stress: Not on file  Relationships  . Social connections:    Talks on phone: Not on file    Gets together: Not on file    Attends religious service: Not on file    Active member of club or organization: Not on file    Attends meetings of clubs or organizations: Not on  file    Relationship status: Not on file  Other Topics Concern  . Not on file  Social History Narrative  . Not on file   Family History: Family History  Problem Relation Age of Onset  . Asthma Mother   . Heart failure Father   . Asthma Sister   . Asthma Brother   . Diabetes Neg Hx    Allergies: Allergies  Allergen Reactions  . Acetaminophen-Codeine Other (See Comments)    Shortness of breath. This was the codeine component, not tylenol.  . Iodine Solution [Povidone Iodine] Rash  . Oxycodone Itching   Medications: See med rec.  Review of Systems: No fevers, chills, night sweats, weight loss, chest pain, or shortness of breath.    Objective:    General: Speaking full sentences, no audible heavy breathing.  Sounds alert and appropriately interactive.  No other physical exam performed due to the non-face to face nature of this visit.  Impression and Recommendations:    Coughing Negative flu test, negative COVID swab. Symptoms now resolved, this was likely a viral bronchitis. I did advise her that a mild dry cough could persist for a month after the infection clears.  Insomnia Primary insomnia, adding trazodone 50. We did discuss sleep hygiene. Return to see me in a month, increase dose if no better.   I discussed the above assessment and treatment plan with the patient. The patient was provided an opportunity to ask questions and all were answered. The patient agreed with the plan and demonstrated an understanding of the instructions.   The patient was advised to call back or seek an in-person evaluation if the symptoms worsen or if the condition fails to improve as anticipated.   I provided 25 minutes of non-face-to-face time during this encounter, 15 minutes of additional time was needed to gather information, review chart, records, communicate/coordinate with staff remotely, and complete documentation.   ___________________________________________ Gwen Her. [name], M.D., ABFM., CAQSM. Primary Care and Sports Medicine [facility]  Adjunct Professor of [place] of [facility] of Medicine

## 2019-03-05 NOTE — Assessment & Plan Note (Signed)
Primary insomnia, adding trazodone 50. We did discuss sleep hygiene. Return to see me in a month, increase dose if no better.

## 2019-03-05 NOTE — Assessment & Plan Note (Signed)
Negative flu test, negative COVID swab. Symptoms now resolved, this was likely a viral bronchitis. I did advise her that a mild dry cough could persist for a month after the infection clears.

## 2019-03-11 ENCOUNTER — Encounter: Payer: Self-pay | Admitting: Sports Medicine

## 2019-04-01 ENCOUNTER — Other Ambulatory Visit: Payer: Self-pay | Admitting: Sports Medicine

## 2019-04-03 MED ORDER — LORAZEPAM 0.5 MG PO TABS: 0.5000 mg | ORAL_TABLET | Freq: Every day | ORAL | 0 refills | Status: DC | PRN

## 2019-06-10 DIAGNOSIS — I517 Cardiomegaly: Secondary | ICD-10-CM | POA: Diagnosis not present

## 2019-06-10 DIAGNOSIS — R0789 Other chest pain: Secondary | ICD-10-CM | POA: Diagnosis not present

## 2019-06-10 DIAGNOSIS — R079 Chest pain, unspecified: Secondary | ICD-10-CM | POA: Diagnosis not present

## 2019-06-12 ENCOUNTER — Encounter: Payer: Self-pay | Admitting: Sports Medicine

## 2019-06-24 DIAGNOSIS — Z1231 Encounter for screening mammogram for malignant neoplasm of breast: Secondary | ICD-10-CM | POA: Diagnosis not present

## 2019-06-24 DIAGNOSIS — Z6838 Body mass index (BMI) 38.0-38.9, adult: Secondary | ICD-10-CM | POA: Diagnosis not present

## 2019-06-24 DIAGNOSIS — Z01419 Encounter for gynecological examination (general) (routine) without abnormal findings: Secondary | ICD-10-CM | POA: Diagnosis not present

## 2019-07-08 ENCOUNTER — Other Ambulatory Visit: Payer: Self-pay | Admitting: Sports Medicine

## 2019-09-10 ENCOUNTER — Other Ambulatory Visit: Payer: Self-pay | Admitting: Sports Medicine

## 2019-09-10 DIAGNOSIS — F5101 Primary insomnia: Secondary | ICD-10-CM

## 2019-09-17 ENCOUNTER — Encounter: Payer: Self-pay | Admitting: Sports Medicine

## 2019-09-17 ENCOUNTER — Ambulatory Visit (INDEPENDENT_AMBULATORY_CARE_PROVIDER_SITE_OTHER): Payer: BC Managed Care – PPO | Admitting: Sports Medicine

## 2019-09-17 ENCOUNTER — Telehealth: Payer: Self-pay | Admitting: Sports Medicine

## 2019-09-17 DIAGNOSIS — R059 Cough, unspecified: Secondary | ICD-10-CM

## 2019-09-17 DIAGNOSIS — R05 Cough: Secondary | ICD-10-CM

## 2019-09-17 MED ORDER — DEXAMETHASONE 4 MG PO TABS: 4.0000 mg | ORAL_TABLET | Freq: Three times a day (TID) | ORAL | 0 refills | Status: DC

## 2019-09-17 MED ORDER — BENZONATATE 200 MG PO CAPS: 200.0000 mg | ORAL_CAPSULE | Freq: Three times a day (TID) | ORAL | 0 refills | Status: DC | PRN

## 2019-09-17 NOTE — Assessment & Plan Note (Signed)
Viral symptoms, possible Covid, swab pending. Speaking full sentences, no nasal flaring, adding Decadron, Tessalon Perles, she will quarantine and let us know when results come in.

## 2019-09-17 NOTE — Telephone Encounter (Signed)
Patient called and was having some shortness of breath, and cough. She is scheduled with PCP at 315 pm today for a follow up and further instructions form PCP. She is waiting on Covid testing results from [facility]. No other questions at this time.

## 2019-09-17 NOTE — Progress Notes (Signed)
Virtual Visit via WebEx/MyChart   I connected with  [name]  on 09/17/19 via WebEx/MyChart/Doximity Video and verified that I am speaking with the correct person using two identifiers.   I discussed the limitations, risks, security and privacy concerns of performing an evaluation and management service by WebEx/MyChart/Doximity Video, including the higher likelihood of inaccurate diagnosis and treatment, and the availability of in person appointments.  We also discussed the likely need of an additional face to face encounter for complete and high quality delivery of care.  I also discussed with the patient that there may be a patient responsible charge related to this service. The patient expressed understanding and wishes to proceed.  Provider location is either at home or medical facility. Patient location is at their home, different from provider location. People involved in care of the patient during this telehealth encounter were myself, my nurse/medical assistant, and my front office/scheduling team member.  Subjective:    CC: Feeling sick  HPI: Fevers, chills, sore throat, cough.  No shortness of breath.  I reviewed the past medical history, family history, social history, surgical history, and allergies today and no changes were needed.  Please see the problem list section below in epic for further details.  Past Medical History: Past Medical History:  Diagnosis Date  . Anxiety   . Arthritis    back   . Asthma    childhood  . GERD (gastroesophageal reflux disease)   . Hypertension    Past Surgical History: Past Surgical History:  Procedure Laterality Date  . CESAREAN SECTION    . CHOLECYSTECTOMY N/A 03/06/2018   Procedure: LAPAROSCOPIC CHOLECYSTECTOMY ERAS PATHWAY;  Surgeon: Clovis Riley, MD;  Location: [facility];  Service: General;  Laterality: N/A;  . tubaligation     Social History: Social History   Socioeconomic History  . Marital status: Divorced     Spouse name: Not on file  . Number of children: Not on file  . Years of education: Not on file  . Highest education level: Not on file  Occupational History  . Not on file  Social Needs  . Financial resource strain: Not on file  . Food insecurity    Worry: Not on file    Inability: Not on file  . Transportation needs    Medical: Not on file    Non-medical: Not on file  Tobacco Use  . Smoking status: Current Every Day Smoker    Packs/day: 0.50  . Smokeless tobacco: Never Used  Substance and Sexual Activity  . Alcohol use: Yes    Comment: weekends   . Drug use: No  . Sexual activity: Not on file  Lifestyle  . Physical activity    Days per week: Not on file    Minutes per session: Not on file  . Stress: Not on file  Relationships  . Social Herbalist on phone: Not on file    Gets together: Not on file    Attends religious service: Not on file    Active member of club or organization: Not on file    Attends meetings of clubs or organizations: Not on file    Relationship status: Not on file  Other Topics Concern  . Not on file  Social History Narrative  . Not on file   Family History: Family History  Problem Relation Age of Onset  . Asthma Mother   . Heart failure Father   . Asthma Sister   .  Asthma Brother   . Diabetes Neg Hx    Allergies: Allergies  Allergen Reactions  . Acetaminophen-Codeine Other (See Comments)    Shortness of breath. This was the codeine component, not tylenol.  . Iodine Solution [Povidone Iodine] Rash  . Oxycodone Itching   Medications: See med rec.  Review of Systems: No fevers, chills, night sweats, weight loss, chest pain, or shortness of breath.   Objective:    General: Speaking full sentences, no audible heavy breathing.  Sounds alert and appropriately interactive.  Appears well.  Face symmetric.  Extraocular movements intact.  Pupils equal and round.  No nasal flaring or accessory muscle use visualized.  No other  physical exam performed due to the non-physical nature of this visit.  Impression and Recommendations:    Coughing Viral symptoms, possible Covid, swab pending. Speaking full sentences, no nasal flaring, adding Decadron, Tessalon Perles, she will quarantine and let us know when results come in.  I discussed the above assessment and treatment plan with the patient. The patient was provided an opportunity to ask questions and all were answered. The patient agreed with the plan and demonstrated an understanding of the instructions.   The patient was advised to call back or seek an in-person evaluation if the symptoms worsen or if the condition fails to improve as anticipated.   I provided 25 minutes of non-face-to-face time during this encounter, 15 minutes of additional time was needed to gather information, review chart, records, communicate/coordinate with staff remotely, troubleshooting the multiple errors that we get every time when trying to do video calls through the electronic medical record, WebEx, and Doximity, restart the encounter multiple times due to instability of the software, as well as complete documentation.   ___________________________________________ Gwen Her. [name], M.D., ABFM., CAQSM. Primary Care and Sports Medicine [facility]  Adjunct Professor of [employer] [facility] [employer]

## 2019-09-24 ENCOUNTER — Encounter: Payer: Self-pay | Admitting: Emergency Medicine

## 2019-09-24 ENCOUNTER — Emergency Department (INDEPENDENT_AMBULATORY_CARE_PROVIDER_SITE_OTHER)
Admission: EM | Admit: 2019-09-24 | Discharge: 2019-09-24 | Disposition: A | Discharge status: Home / Self Care | Payer: BC Managed Care – PPO | Source: Home / Self Care | Attending: Family Medicine | Admitting: Family Medicine

## 2019-09-24 ENCOUNTER — Telehealth: Payer: Self-pay

## 2019-09-24 ENCOUNTER — Other Ambulatory Visit: Payer: Self-pay

## 2019-09-24 DIAGNOSIS — R05 Cough: Secondary | ICD-10-CM | POA: Diagnosis not present

## 2019-09-24 DIAGNOSIS — J9801 Acute bronchospasm: Secondary | ICD-10-CM

## 2019-09-24 DIAGNOSIS — R059 Cough, unspecified: Secondary | ICD-10-CM

## 2019-09-24 DIAGNOSIS — J069 Acute upper respiratory infection, unspecified: Secondary | ICD-10-CM

## 2019-09-24 MED ORDER — ALBUTEROL SULFATE HFA 108 (90 BASE) MCG/ACT IN AERS: 2.0000 | INHALATION_SPRAY | RESPIRATORY_TRACT | 0 refills | Status: DC | PRN

## 2019-09-24 MED ORDER — METHYLPREDNISOLONE ACETATE 80 MG/ML IJ SUSP: 80.0000 mg | Freq: Once | INTRAMUSCULAR | Status: DC

## 2019-09-24 MED ORDER — AZITHROMYCIN 250 MG PO TABS: ORAL_TABLET | ORAL | 0 refills | Status: DC

## 2019-09-24 NOTE — ED Triage Notes (Signed)
Dry cough, headache, fatigue, Chills, body aches x 8 days. Had negative COVID test 8 days ago.Feeling worse.

## 2019-09-24 NOTE — Telephone Encounter (Signed)
Heather Salazar called and is very short of breath. She states she is feeling worse and still has the cough. I advised her to go to the urgent care as soon as possible. I advised her to have someone drive her to the urgent care.

## 2019-09-24 NOTE — ED Provider Notes (Signed)
[facility]    CSN: [id number] Arrival date & time: 09/24/19  9326      History   Chief Complaint Chief Complaint  Patient presents with  . Cough    HPI Heather Salazar is a 48 y.o. female.   Eight days ago patient developed a cough and low grade fever.  She had a rapid COVID19 test at that time that was negative.  Her cough persisted with development of wheezing and shortness of breath with activity.  She has a past history of asthma.  She contacted her PCP who prescribed Decadron 58m (she has been taking BID) and Tessalon Perles 2068mTID.  She reports that during the past 3 days she has felt worse with increased non-productive cough, headache, nocturnal chills, myalgias, increased shortness of breath with activity, and wheezing.  She has developed decreased sense of taste/smell.     Past Medical History:  Diagnosis Date  . Anxiety   . Arthritis    back   . Asthma    childhood  . GERD (gastroesophageal reflux disease)   . Hypertension     Patient Active Problem List   Diagnosis Date Noted  . Insomnia 03/05/2019  . Coughing 12/31/2018  . Transaminitis 09/14/2018  . Gastro-esophageal reflux 09/13/2018  . Benign essential hypertension 01/01/2018  . Biliary colic 0371/24/5809. Annual physical exam 01/16/2015  . Polycystic ovarian syndrome 01/16/2015  . Obesity 01/16/2015    Past Surgical History:  Procedure Laterality Date  . CESAREAN SECTION    . CHOLECYSTECTOMY N/A 03/06/2018   Procedure: LAPAROSCOPIC CHOLECYSTECTOMY ERAS PATHWAY;  Surgeon: CoClovis RileyMD;  Location: [facility];  Service: General;  Laterality: N/A;  . tubaligation      OB History   No obstetric history on file.      Home Medications    Prior to Admission medications   Medication Sig Start Date End Date Taking? Authorizing Provider  albuterol (VENTOLIN HFA) 108 (90 Base) MCG/ACT inhaler Inhale 2 puffs into the lungs every 4 (four) hours as needed for wheezing or  shortness of breath. 09/24/19   BeKandra NicolasMD  azithromycin (ZITHROMAX Z-PAK) 250 MG tablet Take 2 tabs today; then begin one tab once daily for 4 more days. 09/24/19   BeKandra NicolasMD  benzonatate (TESSALON) 200 MG capsule Take 1 capsule (200 mg total) by mouth 3 (three) times daily as needed for cough. 09/17/19   ThSilverio DecampMD  dexamethasone (DECADRON) 4 MG tablet Take 1 tablet (4 mg total) by mouth 3 (three) times daily. 09/17/19   ThSilverio DecampMD  fluticasone (FLONASE) 50 MCG/ACT nasal spray Place 1-2 sprays into both nostrils daily. 12/26/18   MeHali MarryMD  LORazepam (ATIVAN) 0.5 MG tablet Take 1 tablet (0.5 mg total) by mouth daily as needed for anxiety. 04/03/19   ThSilverio DecampMD  OVER THE COUNTER MEDICATION Take 1 packet by mouth daily as needed (headache). Goody's Powder    [provider]  pantoprazole (PROTONIX) 40 MG tablet Take 1 tablet (40 mg total) by mouth daily. 09/13/18   ThSilverio DecampMD  traZODone (DESYREL) 50 MG tablet TAKE 1 TABLET BY MOUTH EVERYDAY AT BEDTIME 09/11/19   ThSilverio DecampMD  valsartan-hydrochlorothiazide (DIOVAN-HCT) 320-25 MG tablet Take 0.5 tablets by mouth daily. 01/01/18   ThSilverio DecampMD    Family History Family History  Problem Relation Age of Onset  . Asthma Mother   . Heart  failure Father   . Asthma Sister   . Asthma Brother   . Diabetes Neg Hx     Social History Social History   Tobacco Use  . Smoking status: Current Every Day Smoker    Packs/day: 0.50  . Smokeless tobacco: Never Used  Substance Use Topics  . Alcohol use: Yes    Comment: weekends   . Drug use: No     Allergies   Acetaminophen-codeine, Iodine solution [povidone iodine], and Oxycodone   Review of Systems Review of Systems No sore throat + cough No pleuritic pain + wheezing + nasal congestion ? post-nasal drainage No sinus pain/pressure No itchy/red eyes No earache +  dizzy No hemoptysis + SOB No fever, + chills No nausea No vomiting No abdominal pain + diarrhea No urinary symptoms No skin rash + fatigue + myalgias + headache Used OTC meds without relief   Physical Exam Triage Vital Signs ED Triage Vitals  Enc Vitals Group     BP 09/24/19 0950 131/90     Pulse Rate 09/24/19 0950 98     Resp --      Temp 09/24/19 0950 98 F (36.7 C)     Temp Source 09/24/19 0950 Oral     SpO2 09/24/19 0950 99 %     Weight 09/24/19 0951 225 lb (102.1 kg)     Height 09/24/19 0951 5' 5"  (1.651 m)     Head Circumference --      Peak Flow --      Pain Score 09/24/19 0950 8     Pain Loc --      Pain Edu? --      Excl. in [place]? --    No data found.  Updated Vital Signs BP 131/90 (BP Location: Right Arm)   Pulse 98   Temp 98 F (36.7 C) (Oral)   Ht 5' 5"  (1.651 m)   Wt 102.1 kg   SpO2 99%   BMI 37.44 kg/m   Visual Acuity Right Eye Distance:   Left Eye Distance:   Bilateral Distance:    Right Eye Near:   Left Eye Near:    Bilateral Near:     Physical Exam Nursing notes and Vital Signs reviewed. Appearance:  Patient appears stated age, and in no acute distress Eyes:  Pupils are equal, round, and reactive to light and accomodation.  Extraocular movement is intact.  Conjunctivae are not inflamed  Ears:  Canals normal.  Tympanic membranes normal.  Nose:  Mildly congested turbinates.  No sinus tenderness.  Pharynx:  Normal Neck:  Supple.  Mildly enlarged lateral nodes are present, tender to palpation on the left.   Lungs:  Clear to auscultation.  Breath sounds are equal.  Moving air well. Heart:  Regular rate and rhythm without murmurs, rubs, or gallops.  Abdomen:  Nontender without masses or hepatosplenomegaly.  Bowel sounds are present.  No CVA or flank tenderness.  Extremities:  No edema.  Skin:  No rash present.   UC Treatments / Results  Labs (all labs ordered are listed, but only abnormal results are displayed) Labs Reviewed  NOVEL  CORONAVIRUS, NAA    EKG   Radiology No results found.  Procedures Procedures (including critical care time)  Medications Ordered in UC Medications  methylPREDNISolone acetate (DEPO-MEDROL) injection 80 mg (has no administration in time range)    Initial Impression / Assessment and Plan / UC Course  I have reviewed the triage vital signs and the nursing notes.  Pertinent  labs & imaging results that were available during my care of the patient were reviewed by me and considered in my medical decision making (see chart for details).    Administered Depo Medrol 67m IM Begin empiric Z-pak. Rx for albuterol inhaler. COVID19 send out.   Final Clinical Impressions(s) / UC Diagnoses   Final diagnoses:  Coughing  Viral URI with cough  Bronchospasm     Discharge Instructions     Take plain guaifenesin (1201mextended release tabs such as Mucinex) twice daily, with plenty of water, for cough and congestion.   Get adequate rest.   Stop Decadron. May use Afrin nasal spray (or generic oxymetazoline) each morning for about 5 days and then discontinue.  Also recommend using saline nasal spray several times daily and saline nasal irrigation (AYR is a common brand).  Use Flonase nasal spray each morning after using Afrin nasal spray and saline nasal irrigation. Try warm salt water gargles for sore throat.  Stop all antihistamines for now, and other non-prescription cough/cold preparations. May take Delsym Cough Suppressant with Tessalon at bedtime for nighttime cough.  Use albuterol inhaler as needed.  Isolate yourself until COVID-19 test result is available.   If your COVID19 test is positive, then you are infected with the novel coronavirus and could give the virus to others.  Please continue isolation at home for at least 10 days since the start of your symptoms. Once you complete your 10 day quarantine, you may return to normal activities as long as you've not had a fever for over  24 hours (without taking fever reducing medicine) and your symptoms are improving. Please continue good preventive care measures, including:  frequent hand-washing, avoid touching your face, cover coughs/sneezes, stay out of crowds and keep a 6 foot distance from others.  Go to the nearest hospital emergency room if fever/cough/breathlessness are severe or illness seems like a threat to life.    ED Prescriptions    Medication Sig Dispense Auth. Provider   azithromycin (ZITHROMAX Z-PAK) 250 MG tablet Take 2 tabs today; then begin one tab once daily for 4 more days. 6 tablet BeKandra NicolasMD   albuterol (VENTOLIN HFA) 108 (90 Base) MCG/ACT inhaler Inhale 2 puffs into the lungs every 4 (four) hours as needed for wheezing or shortness of breath. 18 g BeKandra NicolasMD        BeKandra NicolasMD 09/24/19 2020

## 2019-09-24 NOTE — Discharge Instructions (Addendum)
Take plain guaifenesin (1235m extended release tabs such as Mucinex) twice daily, with plenty of water, for cough and congestion.   Get adequate rest.   Stop Decadron. May use Afrin nasal spray (or generic oxymetazoline) each morning for about 5 days and then discontinue.  Also recommend using saline nasal spray several times daily and saline nasal irrigation (AYR is a common brand).  Use Flonase nasal spray each morning after using Afrin nasal spray and saline nasal irrigation. Try warm salt water gargles for sore throat.  Stop all antihistamines for now, and other non-prescription cough/cold preparations. May take Delsym Cough Suppressant with Tessalon at bedtime for nighttime cough.  Use albuterol inhaler as needed.  Isolate yourself until COVID-19 test result is available.   If your COVID19 test is positive, then you are infected with the novel coronavirus and could give the virus to others.  Please continue isolation at home for at least 10 days since the start of your symptoms. Once you complete your 10 day quarantine, you may return to normal activities as long as you've not had a fever for over 24 hours (without taking fever reducing medicine) and your symptoms are improving. Please continue good preventive care measures, including:  frequent hand-washing, avoid touching your face, cover coughs/sneezes, stay out of crowds and keep a 6 foot distance from others.  Go to the nearest hospital emergency room if fever/cough/breathlessness are severe or illness seems like a threat to life.

## 2019-09-27 LAB — NOVEL CORONAVIRUS, NAA: SARS-CoV-2, NAA: NOT DETECTED

## 2019-10-01 ENCOUNTER — Encounter: Payer: Self-pay | Admitting: Sports Medicine

## 2019-10-01 ENCOUNTER — Telehealth (INDEPENDENT_AMBULATORY_CARE_PROVIDER_SITE_OTHER): Payer: BC Managed Care – PPO | Admitting: Sports Medicine

## 2019-10-01 VITALS — BP 140/110 | Temp 98.6°F

## 2019-10-01 DIAGNOSIS — R519 Headache, unspecified: Secondary | ICD-10-CM

## 2019-10-01 DIAGNOSIS — G43809 Other migraine, not intractable, without status migrainosus: Secondary | ICD-10-CM

## 2019-10-01 DIAGNOSIS — I10 Essential (primary) hypertension: Secondary | ICD-10-CM

## 2019-10-01 MED ORDER — RIZATRIPTAN BENZOATE 5 MG PO TBDP: 5.0000 mg | ORAL_TABLET | ORAL | 3 refills | Status: DC | PRN

## 2019-10-01 MED ORDER — TOPIRAMATE 50 MG PO TABS: ORAL_TABLET | ORAL | 3 refills | Status: DC

## 2019-10-01 NOTE — Assessment & Plan Note (Signed)
Persistent left-sided throbbing headaches with photophobia, phonophobia, nausea, as well as vertigo. These come almost every day. Adding a brain MRI with and without contrast, adding renal function today. Also adding a low-dose of Topamax, Maxalt to abort the headaches. Keep a migraine diary and return to see me in 1 month.

## 2019-10-01 NOTE — Progress Notes (Signed)
Virtual Visit via WebEx/MyChart   I connected with  Heather Salazar  on 10/01/19 via WebEx/MyChart/Doximity Video and verified that I am speaking with the correct person using two identifiers.   I discussed the limitations, risks, security and privacy concerns of performing an evaluation and management service by WebEx/MyChart/Doximity Video, including the higher likelihood of inaccurate diagnosis and treatment, and the availability of in person appointments.  We also discussed the likely need of an additional face to face encounter for complete and high quality delivery of care.  I also discussed with the patient that there may be a patient responsible charge related to this service. The patient expressed understanding and wishes to proceed.  Provider location is either at home or medical facility. Patient location is at their home, different from provider location. People involved in care of the patient during this telehealth encounter were myself, my nurse/medical assistant, and my front office/scheduling team member.  Subjective:    CC: Having headaches  HPI: See below for further details.  I reviewed the past medical history, family history, social history, surgical history, and allergies today and no changes were needed.  Please see the problem list section below in epic for further details.  Past Medical History: Past Medical History:  Diagnosis Date  . Anxiety   . Arthritis    back   . Asthma    childhood  . GERD (gastroesophageal reflux disease)   . Hypertension    Past Surgical History: Past Surgical History:  Procedure Laterality Date  . CESAREAN SECTION    . CHOLECYSTECTOMY N/A 03/06/2018   Procedure: LAPAROSCOPIC CHOLECYSTECTOMY ERAS PATHWAY;  Surgeon: Clovis Riley, MD;  Location: [facility];  Service: General;  Laterality: N/A;  . tubaligation     Social History: Social History   Socioeconomic History  . Marital status: Divorced    Spouse name: Not on  file  . Number of children: Not on file  . Years of education: Not on file  . Highest education level: Not on file  Occupational History  . Not on file  Tobacco Use  . Smoking status: Current Every Day Smoker    Packs/day: 0.50  . Smokeless tobacco: Never Used  Substance and Sexual Activity  . Alcohol use: Yes    Comment: weekends   . Drug use: No  . Sexual activity: Not on file  Other Topics Concern  . Not on file  Social History Narrative  . Not on file   Social Determinants of Health   Financial Resource Strain:   . Difficulty of Paying Living Expenses: Not on file  Food Insecurity:   . Worried About Charity fundraiser in the Last Year: Not on file  . Ran Out of Food in the Last Year: Not on file  Transportation Needs:   . Lack of Transportation (Medical): Not on file  . Lack of Transportation (Non-Medical): Not on file  Physical Activity:   . Days of Exercise per Week: Not on file  . Minutes of Exercise per Session: Not on file  Stress:   . Feeling of Stress : Not on file  Social Connections:   . Frequency of Communication with Friends and Family: Not on file  . Frequency of Social Gatherings with Friends and Family: Not on file  . Attends Religious Services: Not on file  . Active Member of Clubs or Organizations: Not on file  . Attends Archivist Meetings: Not on file  . Marital Status: Not on  file   Family History: Family History  Problem Relation Age of Onset  . Asthma Mother   . Heart failure Father   . Asthma Sister   . Asthma Brother   . Diabetes Neg Hx    Allergies: Allergies  Allergen Reactions  . Acetaminophen-Codeine Other (See Comments)    Shortness of breath. This was the codeine component, not tylenol.  . Iodine Solution [Povidone Iodine] Rash  . Oxycodone Itching   Medications: See med rec.  Review of Systems: No fevers, chills, night sweats, weight loss, chest pain, or shortness of breath.   Objective:    General:  Speaking full sentences, no audible heavy breathing.  Sounds alert and appropriately interactive.  Appears well.  Face symmetric.  Extraocular movements intact.  Pupils equal and round.  No nasal flaring or accessory muscle use visualized.  No other physical exam performed due to the non-physical nature of this visit.  Impression and Recommendations:    Vestibular migraine Persistent left-sided throbbing headaches with photophobia, phonophobia, nausea, as well as vertigo. These come almost every day. Adding a brain MRI with and without contrast, adding renal function today. Also adding a low-dose of Topamax, Maxalt to abort the headaches. Keep a migraine diary and return to see me in 1 month.   I discussed the above assessment and treatment plan with the patient. The patient was provided an opportunity to ask questions and all were answered. The patient agreed with the plan and demonstrated an understanding of the instructions.   The patient was advised to call back or seek an in-person evaluation if the symptoms worsen or if the condition fails to improve as anticipated.   I provided 25 minutes of non-face-to-face time during this encounter, 15 minutes of additional time was needed to gather information, review chart, records, communicate/coordinate with staff remotely, troubleshooting the multiple errors that we get every time when trying to do video calls through the electronic medical record, WebEx, and Doximity, restart the encounter multiple times due to instability of the software, as well as complete documentation.   ___________________________________________ Gwen Her. [name], M.D., ABFM., CAQSM. Primary Care and Sports Medicine [facility]  Adjunct Professor of [place] [employer] of Medicine

## 2019-10-02 DIAGNOSIS — G43809 Other migraine, not intractable, without status migrainosus: Secondary | ICD-10-CM | POA: Diagnosis not present

## 2019-10-02 DIAGNOSIS — I10 Essential (primary) hypertension: Secondary | ICD-10-CM | POA: Diagnosis not present

## 2019-10-03 LAB — LIPID PANEL W/REFLEX DIRECT LDL
Cholesterol: 215 mg/dL — ABNORMAL HIGH (ref <200)
HDL: 59 mg/dL (ref >=50)
LDL Cholesterol (Calc): 131 mg/dL (calc) — ABNORMAL HIGH
Non-HDL Cholesterol (Calc): 156 mg/dL (calc) — ABNORMAL HIGH (ref <130)
Total CHOL/HDL Ratio: 3.6 (calc) (ref <5.0)
Triglycerides: 140 mg/dL (ref <150)

## 2019-10-03 LAB — COMPLETE METABOLIC PANEL WITH GFR
AG Ratio: 1.5 (calc) (ref 1.0–2.5)
ALT: 62 U/L — ABNORMAL HIGH (ref 6–29)
AST: 65 U/L — ABNORMAL HIGH (ref 10–35)
Albumin: 4 g/dL (ref 3.6–5.1)
Alkaline phosphatase (APISO): 120 U/L (ref 31–125)
BUN: 10 mg/dL (ref 7–25)
CO2: 27 mmol/L (ref 20–32)
Calcium: 8.4 mg/dL — ABNORMAL LOW (ref 8.6–10.2)
Chloride: 100 mmol/L (ref 98–110)
Creat: 0.81 mg/dL (ref 0.50–1.10)
GFR, Est African American: 100 mL/min/{1.73_m2} (ref >=60)
GFR, Est Non African American: 86 mL/min/{1.73_m2} (ref >=60)
Globulin: 2.6 g/dL (calc) (ref 1.9–3.7)
Glucose, Bld: 101 mg/dL — ABNORMAL HIGH (ref 65–99)
Potassium: 4.2 mmol/L (ref 3.5–5.3)
Sodium: 139 mmol/L (ref 135–146)
Total Bilirubin: 0.8 mg/dL (ref 0.2–1.2)
Total Protein: 6.6 g/dL (ref 6.1–8.1)

## 2019-10-03 LAB — CBC
HCT: 43.6 % (ref 35.0–45.0)
Hemoglobin: 15.1 g/dL (ref 11.7–15.5)
MCH: 31.7 pg (ref 27.0–33.0)
MCHC: 34.6 g/dL (ref 32.0–36.0)
MCV: 91.6 fL (ref 80.0–100.0)
MPV: 10.9 fL (ref 7.5–12.5)
Platelets: 255 10*3/uL (ref 140–400)
RBC: 4.76 10*6/uL (ref 3.80–5.10)
RDW: 14.4 % (ref 11.0–15.0)
WBC: 5.1 10*3/uL (ref 3.8–10.8)

## 2019-10-03 LAB — VITAMIN B12: Vitamin B-12: 428 pg/mL (ref 200–1100)

## 2019-10-03 LAB — VITAMIN D 25 HYDROXY (VIT D DEFICIENCY, FRACTURES): Vit D, 25-Hydroxy: 7 ng/mL — ABNORMAL LOW (ref 30–100)

## 2019-10-03 LAB — TSH: TSH: 0.92 mIU/L

## 2019-10-03 MED ORDER — VITAMIN D (ERGOCALCIFEROL) 1.25 MG (50000 UNIT) PO CAPS: 50000.0000 [IU] | ORAL_CAPSULE | ORAL | 0 refills | Status: DC

## 2019-10-03 NOTE — Addendum Note (Signed)
Addended by: Silverio Decamp on: 10/03/2019 09:32 AM   Modules accepted: Orders

## 2019-10-07 ENCOUNTER — Other Ambulatory Visit: Payer: Self-pay

## 2019-10-07 ENCOUNTER — Ambulatory Visit (INDEPENDENT_AMBULATORY_CARE_PROVIDER_SITE_OTHER): Payer: BC Managed Care – PPO

## 2019-10-07 DIAGNOSIS — G43909 Migraine, unspecified, not intractable, without status migrainosus: Secondary | ICD-10-CM | POA: Diagnosis not present

## 2019-10-07 DIAGNOSIS — G43809 Other migraine, not intractable, without status migrainosus: Secondary | ICD-10-CM | POA: Diagnosis not present

## 2019-10-07 DIAGNOSIS — R519 Headache, unspecified: Secondary | ICD-10-CM | POA: Diagnosis not present

## 2019-10-07 MED ORDER — GADOBUTROL 1 MMOL/ML IV SOLN
10.0000 mL | Freq: Once | INTRAVENOUS | Status: AC | PRN
  Administered 2019-10-07: 10 mL via INTRAVENOUS

## 2019-11-28 ENCOUNTER — Other Ambulatory Visit: Payer: Self-pay | Admitting: Sports Medicine

## 2019-11-29 ENCOUNTER — Telehealth (INDEPENDENT_AMBULATORY_CARE_PROVIDER_SITE_OTHER): Payer: BC Managed Care – PPO | Admitting: Sports Medicine

## 2019-11-29 DIAGNOSIS — F17200 Nicotine dependence, unspecified, uncomplicated: Secondary | ICD-10-CM | POA: Diagnosis not present

## 2019-11-29 DIAGNOSIS — G43809 Other migraine, not intractable, without status migrainosus: Secondary | ICD-10-CM | POA: Diagnosis not present

## 2019-11-29 DIAGNOSIS — I10 Essential (primary) hypertension: Secondary | ICD-10-CM | POA: Diagnosis not present

## 2019-11-29 MED ORDER — VALSARTAN-HYDROCHLOROTHIAZIDE 320-25 MG PO TABS: 0.5000 | ORAL_TABLET | Freq: Every day | ORAL | 3 refills | Status: DC

## 2019-11-29 MED ORDER — CHANTIX STARTING MONTH PAK 0.5 MG X 11 & 1 MG X 42 PO TABS: ORAL_TABLET | ORAL | 0 refills | Status: DC

## 2019-11-29 NOTE — Assessment & Plan Note (Signed)
Overall doing okay, blood pressures were a bit high at the last visit. Refilling blood pressure medication, she will get me her blood pressure reading when she gets home from work today.

## 2019-11-29 NOTE — Assessment & Plan Note (Signed)
Historically precontemplative but currently would like to actively quit smoking. Currently doing about 2 cigarettes/day. Adding Chantix. Advised about vivid dreams. Return in a month.

## 2019-11-29 NOTE — Assessment & Plan Note (Signed)
This pleasant 49 year old female was having daily left-sided throbbing headaches with photophobia, phonophobia, nausea, and vertigo. Clinically this resembled a vertiginous migraine, we added Topamax, she was taking it every day and then for some reason dropped to every other day.  She is noting a fantastic improvement in headache frequency but still has them every other day. She will go back to daily dosing of Topamax, and continue Maxalt for abortive treatment. She will see me again in a month and go over the migraine diary. Of note her brain MRI was unremarkable.

## 2019-11-29 NOTE — Progress Notes (Signed)
    Procedures performed today:    None.  Independent interpretation of tests performed by another provider:   None.  Impression and Recommendations:    Vestibular migraine This pleasant 49 year old female was having daily left-sided throbbing headaches with photophobia, phonophobia, nausea, and vertigo. Clinically this resembled a vertiginous migraine, we added Topamax, she was taking it every day and then for some reason dropped to every other day.  She is noting a fantastic improvement in headache frequency but still has them every other day. She will go back to daily dosing of Topamax, and continue Maxalt for abortive treatment. She will see me again in a month and go over the migraine diary. Of note her brain MRI was unremarkable.  Smoker Historically precontemplative but currently would like to actively quit smoking. Currently doing about 2 cigarettes/day. Adding Chantix. Advised about vivid dreams. Return in a month.  Benign essential hypertension Overall doing okay, blood pressures were a bit high at the last visit. Refilling blood pressure medication, she will get me her blood pressure reading when she gets home from work today.    ___________________________________________ Gwen Her. [name], M.D., ABFM., CAQSM. Primary Care and [place] Instructor of [employer] [facility] [employer]

## 2019-12-02 ENCOUNTER — Encounter: Payer: Self-pay | Admitting: Sports Medicine

## 2019-12-04 ENCOUNTER — Other Ambulatory Visit: Payer: Self-pay

## 2019-12-04 ENCOUNTER — Encounter: Payer: Self-pay | Admitting: Emergency Medicine

## 2019-12-04 ENCOUNTER — Emergency Department
Admission: EM | Admit: 2019-12-04 | Discharge: 2019-12-04 | Disposition: A | Discharge status: Home / Self Care | Payer: BC Managed Care – PPO | Source: Home / Self Care

## 2019-12-04 DIAGNOSIS — R531 Weakness: Secondary | ICD-10-CM

## 2019-12-04 DIAGNOSIS — R509 Fever, unspecified: Secondary | ICD-10-CM | POA: Diagnosis not present

## 2019-12-04 DIAGNOSIS — G43C0 Periodic headache syndromes in child or adult, not intractable: Secondary | ICD-10-CM

## 2019-12-04 MED ORDER — KETOROLAC TROMETHAMINE 30 MG/ML IJ SOLN
30.0000 mg | Freq: Once | INTRAMUSCULAR | Status: AC
  Administered 2019-12-04: 17:00:00 30 mg via INTRAMUSCULAR

## 2019-12-04 NOTE — ED Provider Notes (Addendum)
[name]    CSN: [id number] Arrival date & time: 12/04/19  1530      History   Chief Complaint No chief complaint on file.   HPI Heather Salazar is a 49 y.o. female.   Patient presents with 3-day history of migraine but now with loss of sense of taste and smell.  Is also had some vomiting and diarrhea and dizziness.  Has tried migraine medication (Maxalt) without relief.  No known contact with Covid but was advised to come here for testing today  HPI  Past Medical History:  Diagnosis Date  . Anxiety   . Arthritis    back   . Asthma    childhood  . GERD (gastroesophageal reflux disease)   . Hypertension     Patient Active Problem List   Diagnosis Date Noted  . Smoker 11/29/2019  . 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  . Insomnia 03/05/2019  . Coughing 12/31/2018  . Transaminitis 09/14/2018  . Gastro-esophageal reflux 09/13/2018  . Benign essential hypertension 01/01/2018  . Biliary colic 07/02/3006  . Annual physical exam 01/16/2015  . Polycystic ovarian syndrome 01/16/2015  . Obesity 01/16/2015    Past Surgical History:  Procedure Laterality Date  . CESAREAN SECTION    . CHOLECYSTECTOMY N/A 03/06/2018   Procedure: LAPAROSCOPIC CHOLECYSTECTOMY ERAS PATHWAY;  Surgeon: Clovis Riley, MD;  Location: [facility];  Service: General;  Laterality: N/A;  . tubaligation      OB History   No obstetric history on file.      Home Medications    Prior to Admission medications   Medication Sig Start Date End Date Taking? Authorizing Provider  albuterol (VENTOLIN HFA) 108 (90 Base) MCG/ACT inhaler Inhale 2 puffs into the lungs every 4 (four) hours as needed for wheezing or shortness of breath. 09/24/19   Kandra Nicolas, MD  benzonatate (TESSALON) 200 MG capsule Take 1 capsule (200 mg total) by mouth 3 (three) times daily as needed for cough. 09/17/19   Silverio Decamp, MD  fluticasone (FLONASE) 50 MCG/ACT nasal spray Place 1-2 sprays into both  nostrils daily. 12/26/18   Hali Marry, MD  LORazepam (ATIVAN) 0.5 MG tablet Take 1 tablet (0.5 mg total) by mouth daily as needed for anxiety. 04/03/19   Silverio Decamp, MD  OVER THE COUNTER MEDICATION Take 1 packet by mouth daily as needed (headache). Goody's Powder    [provider]  pantoprazole (PROTONIX) 40 MG tablet Take 1 tablet (40 mg total) by mouth daily. 09/13/18   Silverio Decamp, MD  rizatriptan (MAXALT-MLT) 5 MG disintegrating tablet Take 1 tablet (5 mg total) by mouth as needed for migraine. May repeat in 2 hours if needed 10/01/19   Silverio Decamp, MD  topiramate (TOPAMAX) 50 MG tablet One half tab by mouth daily for one week then one tab by mouth daily. 10/01/19   Silverio Decamp, MD  traZODone (DESYREL) 50 MG tablet TAKE 1 TABLET BY MOUTH EVERYDAY AT BEDTIME 09/11/19   Silverio Decamp, MD  valsartan-hydrochlorothiazide (DIOVAN-HCT) 320-25 MG tablet Take 0.5 tablets by mouth daily. 11/29/19   Silverio Decamp, MD  varenicline (CHANTIX STARTING MONTH PAK) 0.5 MG X 11 & 1 MG X 42 tablet Take one 0.37m tablet by mouth qd for 3 days, then increase to one 0.512mtablet bid for 3 days, then increase to one 76m55mablet bid. 11/29/19   TheSilverio DecampD  Vitamin D, Ergocalciferol, (DRISDOL) 1.25 MG (50000 UT) CAPS capsule Take  1 capsule (50,000 Units total) by mouth every 7 (seven) days. Take for 8 total doses(weeks) 10/03/19   Silverio Decamp, MD    Family History Family History  Problem Relation Age of Onset  . Asthma Mother   . Heart failure Father   . Asthma Sister   . Asthma Brother   . Diabetes Neg Hx     Social History Social History   Tobacco Use  . Smoking status: Current Every Day Smoker    Packs/day: 0.50  . Smokeless tobacco: Never Used  Substance Use Topics  . Alcohol use: Yes    Comment: weekends   . Drug use: No     Allergies   Acetaminophen-codeine, Iodine solution [povidone iodine],  and Oxycodone   Review of Systems Review of Systems  Gastrointestinal: Positive for diarrhea, nausea and vomiting.  Neurological: Positive for dizziness, light-headedness and headaches.  All other systems reviewed and are negative.    Physical Exam Triage Vital Signs ED Triage Vitals  Enc Vitals Group     BP      Pulse      Resp      Temp      Temp src      SpO2      Weight      Height      Head Circumference      Peak Flow      Pain Score      Pain Loc      Pain Edu?      Excl. in [place]?    No data found.  Updated Vital Signs There were no vitals taken for this visit.  Visual Acuity Right Eye Distance:   Left Eye Distance:   Bilateral Distance:    Right Eye Near:   Left Eye Near:    Bilateral Near:     Physical Exam Vitals and nursing note reviewed.  Constitutional:      Appearance: She is well-developed.  HENT:     Head: Normocephalic.  Eyes:     Extraocular Movements: Extraocular movements intact.     Pupils: Pupils are equal, round, and reactive to light.  Cardiovascular:     Rate and Rhythm: Normal rate and regular rhythm.     Heart sounds: Normal heart sounds.  Pulmonary:     Effort: Pulmonary effort is normal.     Breath sounds: Normal breath sounds.  Abdominal:     General: Bowel sounds are normal.     Palpations: Abdomen is soft. There is no mass.     Tenderness: There is no abdominal tenderness. There is no guarding.  Musculoskeletal:     Cervical back: Normal range of motion. No rigidity.  Neurological:     Mental Status: She is alert.      UC Treatments / Results  Labs (all labs ordered are listed, but only abnormal results are displayed) Labs Reviewed - No data to display  EKG Rapid flu test for a and B were negative  Radiology No results found.  Procedures Procedures (including critical care time)  Medications Ordered in UC Medications - No data to display  Initial Impression / Assessment and Plan / UC Course  I have  reviewed the triage vital signs and the nursing notes.  Pertinent labs & imaging results that were available during my care of the patient were reviewed by me and considered in my medical decision making (see chart for details).     Headache, probable vestibular migraine.  Will  give injection of Toradol today and send test for Covid Final Clinical Impressions(s) / UC Diagnoses   Final diagnoses:  Acute febrile illness     Discharge Instructions     Continue with Tylenol and ibuprofen alternating Increase fluid intake Try to get as much rest as possible   ED Prescriptions    None     PDMP not reviewed this encounter.   Wardell Honour, MD 12/04/19 1553    Wardell Honour, MD 12/04/19 [phone]

## 2019-12-04 NOTE — Telephone Encounter (Signed)
Pt sent a MyChart msg. Contacted pt for clarification of symptoms. As per pt, no longer having episodes of vomiting but continues to have migraines and diarrhea. She mentioned last night she started to have no sense of smell or taste. Pt instructed to go to UC for an evaluation and Covid testing. Pt provided with UC phone number to call beforehand. Pt was agreeable with plan.

## 2019-12-04 NOTE — Discharge Instructions (Addendum)
Continue medicines as before

## 2019-12-04 NOTE — ED Triage Notes (Addendum)
Patient reports 3 days of migraine like headache along with loss of taste and smell; 24 hours ago began having nausea, vomiting and diarrhea with last episodes 1100 today; feels a little dizzy but does not want to lay down; minimal food intake today. Has tried all migraine medication prescribed to her without relief of headache. Has not had influenza vacc this season. No known contact with covid positive person recently. Last covid test was in 12/20.

## 2019-12-05 LAB — NOVEL CORONAVIRUS, NAA: SARS-CoV-2, NAA: NOT DETECTED

## 2019-12-17 ENCOUNTER — Other Ambulatory Visit: Payer: Self-pay

## 2019-12-17 ENCOUNTER — Ambulatory Visit (INDEPENDENT_AMBULATORY_CARE_PROVIDER_SITE_OTHER): Payer: BC Managed Care – PPO | Admitting: Sports Medicine

## 2019-12-17 DIAGNOSIS — G43809 Other migraine, not intractable, without status migrainosus: Secondary | ICD-10-CM | POA: Diagnosis not present

## 2019-12-17 MED ORDER — TOPIRAMATE 50 MG PO TABS: 50.0000 mg | ORAL_TABLET | Freq: Two times a day (BID) | ORAL | 3 refills | Status: DC

## 2019-12-17 NOTE — Assessment & Plan Note (Signed)
This pleasant 49 year old female returns, she continues to have an occasional migraine, unfortunately still having 5-10 headache days per month. Today we filled out her FMLA paperwork for migraines, I am also going to go up on her Topamax dose. Return to see me in a month, we can go over her migraine diary at that point.

## 2019-12-17 NOTE — Progress Notes (Signed)
    Procedures performed today:    None.  Independent interpretation of notes and tests performed by another provider:   None.  Impression and Recommendations:    Vestibular migraine This pleasant 49 year old female returns, she continues to have an occasional migraine, unfortunately still having 5-10 headache days per month. Today we filled out her FMLA paperwork for migraines, I am also going to go up on her Topamax dose. Return to see me in a month, we can go over her migraine diary at that point.    ___________________________________________ Gwen Her. [name], M.D., ABFM., CAQSM. Primary Care and [place] Instructor of [place] [employer] [facility] of Medicine

## 2019-12-30 ENCOUNTER — Other Ambulatory Visit: Payer: Self-pay | Admitting: Sports Medicine

## 2019-12-30 DIAGNOSIS — G43809 Other migraine, not intractable, without status migrainosus: Secondary | ICD-10-CM

## 2020-01-06 ENCOUNTER — Other Ambulatory Visit: Payer: Self-pay | Admitting: Family Medicine

## 2020-01-14 ENCOUNTER — Ambulatory Visit: Payer: BC Managed Care – PPO | Admitting: Sports Medicine

## 2020-03-10 ENCOUNTER — Other Ambulatory Visit: Payer: Self-pay | Admitting: Sports Medicine

## 2020-03-10 DIAGNOSIS — G43809 Other migraine, not intractable, without status migrainosus: Secondary | ICD-10-CM

## 2020-03-14 ENCOUNTER — Other Ambulatory Visit: Payer: Self-pay | Admitting: Family Medicine

## 2020-05-11 ENCOUNTER — Ambulatory Visit: Payer: BC Managed Care – PPO | Admitting: Sports Medicine

## 2020-05-27 ENCOUNTER — Other Ambulatory Visit: Payer: Self-pay

## 2020-05-27 ENCOUNTER — Emergency Department
Admit: 2020-05-27 | Discharge status: Absent without leave | Payer: Managed Care, Other (non HMO) | Source: Home / Self Care

## 2020-05-27 ENCOUNTER — Emergency Department (INDEPENDENT_AMBULATORY_CARE_PROVIDER_SITE_OTHER)
Admission: EM | Admit: 2020-05-27 | Discharge: 2020-05-27 | Disposition: A | Discharge status: Home / Self Care | Payer: Managed Care, Other (non HMO) | Source: Home / Self Care | Attending: Emergency Medicine | Admitting: Emergency Medicine

## 2020-05-27 ENCOUNTER — Encounter: Payer: Self-pay | Admitting: Emergency Medicine

## 2020-05-27 DIAGNOSIS — Z20822 Contact with and (suspected) exposure to covid-19: Secondary | ICD-10-CM

## 2020-05-27 DIAGNOSIS — R300 Dysuria: Secondary | ICD-10-CM | POA: Diagnosis not present

## 2020-05-27 DIAGNOSIS — R1011 Right upper quadrant pain: Secondary | ICD-10-CM | POA: Diagnosis not present

## 2020-05-27 LAB — POCT CBC W AUTO DIFF (K'VILLE URGENT CARE)

## 2020-05-27 LAB — POCT URINALYSIS DIP (MANUAL ENTRY)
Glucose, UA: NEGATIVE mg/dL
Ketones, POC UA: NEGATIVE mg/dL
Leukocytes, UA: NEGATIVE
Nitrite, UA: NEGATIVE
Protein Ur, POC: NEGATIVE mg/dL
Spec Grav, UA: 1.02 (ref 1.010–1.025)
Urobilinogen, UA: 4 E.U./dL — AB
pH, UA: 6.5 (ref 5.0–8.0)

## 2020-05-27 MED ORDER — CIPROFLOXACIN HCL 500 MG PO TABS: 500.0000 mg | ORAL_TABLET | Freq: Two times a day (BID) | ORAL | 0 refills | Status: DC

## 2020-05-27 MED ORDER — ONDANSETRON 4 MG PO TBDP: 4.0000 mg | ORAL_TABLET | Freq: Three times a day (TID) | ORAL | 0 refills | Status: DC | PRN

## 2020-05-27 MED ORDER — HYOSCYAMINE SULFATE 0.125 MG PO TABS: ORAL_TABLET | ORAL | 0 refills | Status: DC

## 2020-05-27 MED ORDER — ONDANSETRON 4 MG PO TBDP
4.0000 mg | ORAL_TABLET | Freq: Once | ORAL | Status: AC
  Administered 2020-05-27: 4 mg via ORAL

## 2020-05-27 NOTE — Discharge Instructions (Signed)
Based on history and physical exam and normal CBC today, I am not certain the cause for your upper abdominal pain, nausea, vomiting, diarrhea.  No sign that this is appendicitis or a surgical abdomen. I am suspicious this could be related to your prior gallbladder surgery. The urine test is not diagnostic, we're sending off urine culture.-You have some microscopic blood in your urine, this possibly could be a kidney stone, although I think this is a less likely cause for your symptoms. You have loss of taste and smell, which is one of the possible symptoms of COVID, so were sending off PCR Covid test. It is possible that you have infectious gastroenteritis, so I am going to cover you with an antibiotic.  Will also treat with nausea medicine (which helped here in urgent care) and also medicine to help abdominal cramping. You need to call and schedule appointment with Dr. [name] for recheck visit in 2 days.  And to review various other send-off blood tests. If any severe or worsening symptoms, you need to go to emergency room. -Please read attached instruction sheets about abdominal pain, nausea vomiting and diarrhea.

## 2020-05-27 NOTE — ED Triage Notes (Signed)
C/o abdominal pain w/ N/V/D - started on Monday  Drinking fluids - intermittent nausea Describes as spasms upper abd radiates to the RUQ Lower back pain  Denies any burning w/ urination Denies hx of kidney stones Pt has had a chole Covid Vaccine - Moderna ( x2)

## 2020-05-27 NOTE — ED Provider Notes (Addendum)
Heather Salazar CARE    CSN: [id number] Arrival date & time: 05/27/20  1129      History   Chief Complaint Chief Complaint  Patient presents with  . Abdominal Pain  . Nausea    HPI Heather Salazar is a 49 y.o. female.   HPI   C/o abdominal pain w/ N/V/D - started 2 days ago.  Intermittent symptoms, possibly related to eating food. Drinking fluids, tolerating p.o. liquids.- intermittent nausea.  Vomited x2 this morning, no blood or bile in vomitus. Describes as episodic spasms upper abd, epigastric area, radiates to the RUQ. 2 loose watery stools today without blood or mucus or melena. Some vague, nonspecific lower back pain , without radiation to extremities or any weakness or numbness or focal neurologic symptoms. Denies any burning w/ urination.  No voiding symptoms or hematuria.  No vaginal bleeding. Denies hx of kidney stones Pt has had a cholecystectomy 2019. Patient has received the Covid Vaccine - Moderna ( x2) -this past year.  She tried ibuprofen without any help. No definite fever or chills but she may have had sweats. Denies any known Covid exposure or anyone else with GI symptoms. Denies chest pain or shortness of breath or cough or coryza.  About 2 weeks ago, she had some fatigue and nausea with a negative Covid test, and was feeling better for a week or so until the above symptoms started 2 days ago.  Her PCP is Dr. [name].   Past Medical History:  Diagnosis Date  . Anxiety   . Arthritis    back   . Asthma    childhood  . GERD (gastroesophageal reflux disease)   . Hypertension     Patient Active Problem List   Diagnosis Date Noted  . Smoker 11/29/2019  . 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  . Insomnia 03/05/2019  . Coughing 12/31/2018  . Transaminitis 09/14/2018  . Gastro-esophageal reflux 09/13/2018  . Benign essential hypertension 01/01/2018  . Biliary colic 92/08/9416  . Annual physical exam 01/16/2015  . Polycystic ovarian  syndrome 01/16/2015  . Obesity 01/16/2015    Past Surgical History:  Procedure Laterality Date  . CESAREAN SECTION    . CHOLECYSTECTOMY N/A 03/06/2018   Procedure: LAPAROSCOPIC CHOLECYSTECTOMY ERAS PATHWAY;  Surgeon: Clovis Riley, MD;  Location: [facility];  Service: General;  Laterality: N/A;  . tubaligation      OB History   No obstetric history on file.      Home Medications    Prior to Admission medications   Medication Sig Start Date End Date Taking? Authorizing Provider  traZODone (DESYREL) 50 MG tablet TAKE 1 TABLET BY MOUTH EVERYDAY AT BEDTIME 09/11/19  Yes Silverio Decamp, MD  valsartan-hydrochlorothiazide (DIOVAN-HCT) 320-25 MG tablet Take 0.5 tablets by mouth daily. 11/29/19  Yes Silverio Decamp, MD  albuterol (VENTOLIN HFA) 108 (90 Base) MCG/ACT inhaler Inhale 2 puffs into the lungs every 4 (four) hours as needed for wheezing or shortness of breath. 09/24/19   Kandra Nicolas, MD  ciprofloxacin (CIPRO) 500 MG tablet Take 1 tablet (500 mg total) by mouth 2 (two) times daily. For 7 days 05/27/20   Jacqulyn Cane, MD  fluticasone [facility]) 50 MCG/ACT nasal spray PLACE 1-2 SPRAYS INTO BOTH NOSTRILS DAILY. 03/16/20   Silverio Decamp, MD  hyoscyamine (LEVSIN) 0.125 MG tablet Take one by mouth every 4-6 hours as needed for abdominal cramping or pain or diarrhea. 05/27/20   Jacqulyn Cane, MD  LORazepam (ATIVAN) 0.5 MG tablet Take 1 tablet (  0.5 mg total) by mouth daily as needed for anxiety. 04/03/19   Silverio Decamp, MD  ondansetron (ZOFRAN-ODT) 4 MG disintegrating tablet Take 1 tablet (4 mg total) by mouth every 8 (eight) hours as needed for nausea or vomiting. 05/27/20   Jacqulyn Cane, MD  OVER THE COUNTER MEDICATION Take 1 packet by mouth daily as needed (headache). Goody's Powder    [provider]  rizatriptan (MAXALT-MLT) 5 MG disintegrating tablet Take 1 tablet (5 mg total) by mouth as needed for migraine. May repeat in 2 hours if needed  10/01/19   Silverio Decamp, MD  topiramate (TOPAMAX) 50 MG tablet TAKE 1 TABLET BY MOUTH TWICE A DAY 03/10/20   Silverio Decamp, MD  pantoprazole (PROTONIX) 40 MG tablet Take 1 tablet (40 mg total) by mouth daily. 09/13/18 05/27/20  Silverio Decamp, MD    Family History Family History  Problem Relation Age of Onset  . Asthma Mother   . Heart failure Father   . Asthma Sister   . Asthma Brother   . Diabetes Neg Hx     Social History Social History   Tobacco Use  . Smoking status: Current Every Day Smoker    Packs/day: 0.50    Types: Cigarettes  . Smokeless tobacco: Never Used  Vaping Use  . Vaping Use: Never used  Substance Use Topics  . Alcohol use: Yes    Comment: weekends   . Drug use: No     Allergies   Acetaminophen-codeine, Iodine solution [povidone iodine], and Oxycodone   Review of Systems Review of Systems  HENT: Negative for trouble swallowing.        After questioning, she notes mild loss of taste and smell.  Eyes: Negative for photophobia and visual disturbance.  Respiratory: Negative for cough, choking, chest tightness, shortness of breath, wheezing and stridor.   Cardiovascular: Negative for chest pain, palpitations and leg swelling.  Gastrointestinal: Positive for abdominal pain, nausea and vomiting. Negative for blood in stool.  Genitourinary: Negative.   Musculoskeletal: Negative for myalgias and neck stiffness.  Skin: Negative for rash.  Neurological: Negative for dizziness, seizures and syncope.  Psychiatric/Behavioral: Negative for confusion and hallucinations.  All other systems reviewed and are negative.  See also HPI  Physical Exam Triage Vital Signs ED Triage Vitals  Enc Vitals Group     BP 05/27/20 1154 (!) 138/92     Pulse Rate 05/27/20 1154 76     Resp 05/27/20 1154 17     Temp 05/27/20 1154 98.7 F (37.1 C)     Temp Source 05/27/20 1154 Oral     SpO2 05/27/20 1154 100 %     Weight 05/27/20 1156 220 lb (99.8 kg)      Height 05/27/20 1156 5' 5"  (1.651 m)     Head Circumference --      Peak Flow --      Pain Score 05/27/20 1155 4     Pain Loc --      Pain Edu? --      Excl. in [place]? --    No data found.  Updated Vital Signs BP (!) 138/92 (BP Location: Right Arm)   Pulse 76   Temp 98.7 F (37.1 C) (Oral)   Resp 17   Ht 5' 5"  (1.651 m)   Wt 99.8 kg   LMP 09/06/2018 (Exact Date) Comment: patient signed preg test waiver  SpO2 100%   BMI 36.61 kg/m    Physical Exam Vitals and nursing  note reviewed.  Constitutional:      General: She is not in acute distress.    Appearance: She is well-developed. She is not toxic-appearing.     Comments: Appears fatigued, but no acute distress. Pleasant, cooperative.  HENT:     Head: Normocephalic and atraumatic.     Nose: Nose normal.     Mouth/Throat:     Mouth: Mucous membranes are moist.     Pharynx: No pharyngeal swelling or oropharyngeal exudate.     Comments: No redness or exudate. Eyes:     General: No scleral icterus.    Pupils: Pupils are equal, round, and reactive to light.  Neck:     Vascular: No JVD.  Cardiovascular:     Rate and Rhythm: Normal rate and regular rhythm.     Heart sounds: Normal heart sounds. No murmur heard.  No friction rub. No gallop.   Pulmonary:     Effort: Pulmonary effort is normal. No respiratory distress.     Breath sounds: Normal breath sounds. No stridor. No wheezing, rhonchi or rales.  Abdominal:     General: Bowel sounds are increased. There is no distension or abdominal bruit.     Palpations: Abdomen is soft. There is no mass.     Tenderness: There is abdominal tenderness (Mild) in the right upper quadrant and epigastric area. There is no right CVA tenderness, left CVA tenderness, guarding or rebound. Negative signs include McBurney's sign.     Comments: No right lower quadrant or left lower quadrant tenderness. Percell Miller sign is equivocal and nonreproducible.  Mild right upper quadrant and epigastric  tenderness.  No masses guarding or rebound.  Musculoskeletal:        General: No tenderness. Normal range of motion.     Cervical back: Normal range of motion and neck supple.  Lymphadenopathy:     Cervical: No cervical adenopathy.  Skin:    Capillary Refill: Capillary refill takes less than 2 seconds.     Findings: No rash.  Neurological:     General: No focal deficit present.     Mental Status: She is alert and oriented to person, place, and time.  Psychiatric:        Mood and Affect: Mood normal.      UC Treatments / Results  Labs (all labs ordered are listed, but only abnormal results are displayed) Labs Reviewed  POCT URINALYSIS DIP (MANUAL ENTRY) - Abnormal; Notable for the following components:      Result Value   Bilirubin, UA small (*)    Blood, UA moderate (*)    Urobilinogen, UA 4.0 (*)    All other components within normal limits  URINE CULTURE  NOVEL CORONAVIRUS, NAA  COMPLETE METABOLIC PANEL WITH GFR  AMYLASE  LIPASE  POCT CBC W AUTO DIFF ([facility])   CBC today: Within normal limits.  WBC 4.6, hemoglobin 14.6.  Platelets normal 269,000.  Radiology No results found.  Procedures Procedures (including critical care time)  Medications Ordered in UC Medications  ondansetron (ZOFRAN-ODT) disintegrating tablet 4 mg (4 mg Oral Given 05/27/20 1210)  Nausea improved after recheck  Initial Impression / Assessment and Plan / UC Course  I have reviewed the triage vital signs and the nursing notes.  Pertinent labs & imaging results that were available during my care of the patient were reviewed by me and considered in my medical decision making (see chart for details).      Final Clinical Impressions(s) / UC Diagnoses  Final diagnoses:  Abdominal pain, right upper quadrant  Dysuria  Exposure to COVID-19 virus  -Note, patient denies dysuria or any urinary symptoms.  Urinalysis shows some microscopic blood, could possibly be a kidney stone,  however more likely related to positive bilirubin and urobilinogen in the urine. Constellation of abdominal symptoms and mostly right upper quadrant pain, with some mild epigastric tenderness, my likely diagnosis is this is related to her prior gallbladder surgery, possibly a retained common bile duct stone and/or pancreatitis.  She is uncomfortable but not in acute distress.  Clinically not dehydrated and she can tolerate p.o. liquids. Also in the differential would be - kidney stone.   -Gastroenteritis.   -Need to rule out Covid. CBC today within normal limits and in context of her history and physical exam, no evidence of acute abdomen, so no stat imaging indicated. (No CT or abdominal ultrasound available in our facility at this time)  Note, after being observed after being given Zofran sublingually, her nausea and abdominal and pain improved somewhat.  She was able to tolerate p.o. water.  After risk benefits alternatives discussed, she agrees to the following plans: Cipro prescribed to cover UTI bacteria and also bacterial gastroenteritis/diarrhea. Zofran prescribed for nausea. Levsin prescribed for abdominal cramping. Discussed pushing clear liquids and eating bland diet.  Precautions discussed. Red flags discussed.-Discussed signs or symptoms that would warrant going to emergency room stat. Questions invited and answered. Patient voiced understanding and agreement.   Discharge Instructions     Based on history and physical exam and normal CBC today, I am not certain the cause for your upper abdominal pain, nausea, vomiting, diarrhea.  No sign that this is appendicitis or a surgical abdomen. I am suspicious this could be related to your prior gallbladder surgery. The urine test is not diagnostic, we're sending off urine culture.-You have some microscopic blood in your urine, this possibly could be a kidney stone, although I think this is a less likely cause for your symptoms. You  have loss of taste and smell, which is one of the possible symptoms of COVID, so were sending off PCR Covid test. It is possible that you have infectious gastroenteritis, so I am going to cover you with an antibiotic.  Will also treat with nausea medicine (which helped here in urgent care) and also medicine to help abdominal cramping. You need to call and schedule appointment with Dr. [name] for recheck visit in 2 days.  And to review various other send-off blood tests. If any severe or worsening symptoms, you need to go to emergency room. -Please read attached instruction sheets about abdominal pain, nausea vomiting and diarrhea.     ED Prescriptions    Medication Sig Dispense Auth. Provider   ciprofloxacin (CIPRO) 500 MG tablet Take 1 tablet (500 mg total) by mouth 2 (two) times daily. For 7 days 14 tablet Jacqulyn Cane, MD   ondansetron (ZOFRAN-ODT) 4 MG disintegrating tablet Take 1 tablet (4 mg total) by mouth every 8 (eight) hours as needed for nausea or vomiting. 5 tablet Jacqulyn Cane, MD   hyoscyamine (LEVSIN) 0.125 MG tablet Take one by mouth every 4-6 hours as needed for abdominal cramping or pain or diarrhea. 15 tablet Jacqulyn Cane, MD     PDMP not reviewed this encounter.   Jacqulyn Cane, MD 05/29/20 1903 Over 50 minutes spent.    Jacqulyn Cane, MD 05/29/20 Einar Crow

## 2020-05-28 LAB — COMPLETE METABOLIC PANEL WITH GFR
AG Ratio: 1.4 (calc) (ref 1.0–2.5)
ALT: 36 U/L — ABNORMAL HIGH (ref 6–29)
AST: 48 U/L — ABNORMAL HIGH (ref 10–35)
Albumin: 4.2 g/dL (ref 3.6–5.1)
Alkaline phosphatase (APISO): 102 U/L (ref 31–125)
BUN: 9 mg/dL (ref 7–25)
CO2: 29 mmol/L (ref 20–32)
Calcium: 9.2 mg/dL (ref 8.6–10.2)
Chloride: 99 mmol/L (ref 98–110)
Creat: 0.9 mg/dL (ref 0.50–1.10)
GFR, Est African American: 88 mL/min/{1.73_m2} (ref >=60)
GFR, Est Non African American: 76 mL/min/{1.73_m2} (ref >=60)
Globulin: 2.9 g/dL (calc) (ref 1.9–3.7)
Glucose, Bld: 90 mg/dL (ref 65–99)
Potassium: 3.5 mmol/L (ref 3.5–5.3)
Sodium: 138 mmol/L (ref 135–146)
Total Bilirubin: 2.6 mg/dL — ABNORMAL HIGH (ref 0.2–1.2)
Total Protein: 7.1 g/dL (ref 6.1–8.1)

## 2020-05-28 LAB — URINE CULTURE
MICRO NUMBER:: 10841853
SPECIMEN QUALITY:: ADEQUATE

## 2020-05-28 LAB — LIPASE: Lipase: 30 U/L (ref 7–60)

## 2020-05-28 LAB — AMYLASE: Amylase: 49 U/L (ref 21–101)

## 2020-05-29 LAB — SARS-COV-2, NAA 2 DAY TAT

## 2020-05-29 LAB — NOVEL CORONAVIRUS, NAA: SARS-CoV-2, NAA: NOT DETECTED

## 2020-06-19 ENCOUNTER — Ambulatory Visit: Payer: Managed Care, Other (non HMO) | Admitting: Nurse Practitioner

## 2020-06-22 ENCOUNTER — Ambulatory Visit: Payer: Managed Care, Other (non HMO) | Admitting: Nurse Practitioner

## 2020-08-03 LAB — HM MAMMOGRAPHY

## 2020-09-07 ENCOUNTER — Other Ambulatory Visit: Payer: Self-pay | Admitting: Sports Medicine

## 2020-09-07 DIAGNOSIS — G43809 Other migraine, not intractable, without status migrainosus: Secondary | ICD-10-CM

## 2020-10-13 ENCOUNTER — Telehealth (INDEPENDENT_AMBULATORY_CARE_PROVIDER_SITE_OTHER): Payer: Managed Care, Other (non HMO) | Admitting: Sports Medicine

## 2020-10-13 DIAGNOSIS — J45909 Unspecified asthma, uncomplicated: Secondary | ICD-10-CM | POA: Insufficient documentation

## 2020-10-13 DIAGNOSIS — J4520 Mild intermittent asthma, uncomplicated: Secondary | ICD-10-CM | POA: Diagnosis not present

## 2020-10-13 DIAGNOSIS — U071 COVID-19: Secondary | ICD-10-CM | POA: Diagnosis not present

## 2020-10-13 MED ORDER — DEXAMETHASONE 4 MG PO TABS: 4.0000 mg | ORAL_TABLET | Freq: Three times a day (TID) | ORAL | 0 refills | Status: DC

## 2020-10-13 MED ORDER — DIPHENOXYLATE-ATROPINE 2.5-0.025 MG PO TABS: ORAL_TABLET | ORAL | 3 refills | Status: DC

## 2020-10-13 MED ORDER — AZITHROMYCIN 250 MG PO TABS: ORAL_TABLET | ORAL | 0 refills | Status: DC

## 2020-10-13 NOTE — Assessment & Plan Note (Signed)
This is a pleasant 50 year old female, she is COVID-19 vaccinated, unfortunately she did contract the virus, she is having cough, wheezing, increasing use of her inhaler, and some diarrhea. Nonbloody. Due to her history of asthma we are going to treat her aggressively with Decadron, azithromycin, and Lomotil for her diarrhea, she was stable from a respiratory standpoint on the video visit today. She can return to see me in person in 2 weeks should she have any persistent symptoms.

## 2020-10-13 NOTE — Progress Notes (Signed)
   Virtual Visit via WebEx/MyChart   I connected with  Heather Salazar  on 10/13/20 via WebEx/MyChart/Doximity Video and verified that I am speaking with the correct person using two identifiers.   I discussed the limitations, risks, security and privacy concerns of performing an evaluation and management service by WebEx/MyChart/Doximity Video, including the higher likelihood of inaccurate diagnosis and treatment, and the availability of in person appointments.  We also discussed the likely need of an additional face to face encounter for complete and high quality delivery of care.  I also discussed with the patient that there may be a patient responsible charge related to this service. The patient expressed understanding and wishes to proceed.  Provider location is in medical facility. Patient location is at their home, different from provider location. People involved in care of the patient during this telehealth encounter were myself, my nurse/medical assistant, and my front office/scheduling team member.  Review of Systems: No fevers, chills, night sweats, weight loss, chest pain, or shortness of breath.   Objective Findings:    General: Speaking full sentences, no audible heavy breathing.  Sounds alert and appropriately interactive.  Appears well.  Face symmetric.  Extraocular movements intact.  Pupils equal and round.  No nasal flaring or accessory muscle use visualized.  Independent interpretation of tests performed by another provider:   None.  Brief History, Exam, Impression, and Recommendations:    COVID-19 This is a pleasant 50 year old female, she is COVID-19 vaccinated, unfortunately she did contract the virus, she is having cough, wheezing, increasing use of her inhaler, and some diarrhea. Nonbloody. Due to her history of asthma we are going to treat her aggressively with Decadron, azithromycin, and Lomotil for her diarrhea, she was stable from a respiratory standpoint on  the video visit today. She can return to see me in person in 2 weeks should she have any persistent symptoms.   I discussed the above assessment and treatment plan with the patient. The patient was provided an opportunity to ask questions and all were answered. The patient agreed with the plan and demonstrated an understanding of the instructions.   The patient was advised to call back or seek an in-person evaluation if the symptoms worsen or if the condition fails to improve as anticipated.   I provided 30 minutes of face to face and non-face-to-face time during this encounter date, time was needed to gather information, review chart, records, communicate/coordinate with staff remotely, as well as complete documentation.   ___________________________________________ Gwen Her. [name], M.D., ABFM., CAQSM. Primary Care and [place] Instructor of [place] [facility] [employer]

## 2020-10-13 NOTE — Progress Notes (Signed)
Went to testing site and initially tested negative but PCR came back positive on 10/06/20. Vomiting, diarrhea, headaches, wheezing, SOB, coughing, fatigue. Got Modenera vaccine x 2 in April.

## 2020-10-13 NOTE — Telephone Encounter (Signed)
Return to work can be 5 days after the day of diagnosis, and she would need to wear a mask.

## 2020-10-14 ENCOUNTER — Encounter: Payer: Self-pay | Admitting: Sports Medicine

## 2020-10-15 ENCOUNTER — Other Ambulatory Visit: Payer: Self-pay | Admitting: Sports Medicine

## 2020-10-15 DIAGNOSIS — I10 Essential (primary) hypertension: Secondary | ICD-10-CM

## 2020-11-12 ENCOUNTER — Other Ambulatory Visit: Payer: Self-pay

## 2020-11-12 ENCOUNTER — Encounter: Payer: Self-pay | Admitting: Sports Medicine

## 2020-11-12 ENCOUNTER — Ambulatory Visit (INDEPENDENT_AMBULATORY_CARE_PROVIDER_SITE_OTHER): Payer: Managed Care, Other (non HMO)

## 2020-11-12 ENCOUNTER — Ambulatory Visit (INDEPENDENT_AMBULATORY_CARE_PROVIDER_SITE_OTHER): Payer: Managed Care, Other (non HMO) | Admitting: Sports Medicine

## 2020-11-12 DIAGNOSIS — M533 Sacrococcygeal disorders, not elsewhere classified: Secondary | ICD-10-CM

## 2020-11-12 DIAGNOSIS — J4520 Mild intermittent asthma, uncomplicated: Secondary | ICD-10-CM

## 2020-11-12 DIAGNOSIS — F321 Major depressive disorder, single episode, moderate: Secondary | ICD-10-CM | POA: Diagnosis not present

## 2020-11-12 DIAGNOSIS — M51369 Other intervertebral disc degeneration, lumbar region without mention of lumbar back pain or lower extremity pain: Secondary | ICD-10-CM | POA: Insufficient documentation

## 2020-11-12 DIAGNOSIS — M5136 Other intervertebral disc degeneration, lumbar region: Secondary | ICD-10-CM | POA: Diagnosis not present

## 2020-11-12 DIAGNOSIS — Z0000 Encounter for general adult medical examination without abnormal findings: Secondary | ICD-10-CM

## 2020-11-12 MED ORDER — ZOLPIDEM TARTRATE 5 MG PO TABS: 5.0000 mg | ORAL_TABLET | Freq: Every evening | ORAL | 1 refills | Status: DC | PRN

## 2020-11-12 MED ORDER — MELOXICAM 15 MG PO TABS: ORAL_TABLET | ORAL | 3 refills | Status: DC

## 2020-11-12 MED ORDER — FLOVENT HFA 110 MCG/ACT IN AERO: 1.0000 | INHALATION_SPRAY | Freq: Two times a day (BID) | RESPIRATORY_TRACT | 12 refills | Status: DC

## 2020-11-12 NOTE — Assessment & Plan Note (Signed)
Due for colon cancer screening. Gets her OB/GYN Pap smears at [place].

## 2020-11-12 NOTE — Assessment & Plan Note (Signed)
Increasing wheezing at night, she has gained a great deal of weight. Ongoing add Flovent to use twice a day, continue albuterol as needed, she is coarse in the upper lobes bilaterally we will also add a chest x-ray, if I do see an infiltrate we will add some azithromycin. I would also like to add a home sleep study considering her weight gain and increasing snoring.

## 2020-11-12 NOTE — Assessment & Plan Note (Signed)
Was seen by psychiatry, placed on Zoloft and mirtazapine. Unfortunately she has gained a great deal of weight on mirtazapine. This was used to help her sleep as trazodone was ineffective, discontinue mirtazapine and switching to Ambien. We will keep an eye on her weight.

## 2020-11-12 NOTE — Assessment & Plan Note (Signed)
Low back pain referable to the SI joint on the left, worse with standing and walking. Nothing overtly radicular, pain refers to the posterior lateral thigh. Updating x-rays of her lumbar spine and SI joints, adding meloxicam and aggressive formal physical therapy, return to see me in a month, SI joint injection for diagnostic and therapeutic purposes if no better.

## 2020-11-12 NOTE — Progress Notes (Signed)
    Procedures performed today:    None.  Independent interpretation of notes and tests performed by another provider:   None.  Brief History, Exam, Impression, and Recommendations:    Mild intermittent asthma Increasing wheezing at night, she has gained a great deal of weight. Ongoing add Flovent to use twice a day, continue albuterol as needed, she is coarse in the upper lobes bilaterally we will also add a chest x-ray, if I do see an infiltrate we will add some azithromycin. I would also like to add a home sleep study considering her weight gain and increasing snoring.  Depression, major, single episode, moderate ([place]) Was seen by psychiatry, placed on Zoloft and mirtazapine. Unfortunately she has gained a great deal of weight on mirtazapine. This was used to help her sleep as trazodone was ineffective, discontinue mirtazapine and switching to Ambien. We will keep an eye on her weight.  Lumbar degenerative disc disease Low back pain referable to the SI joint on the left, worse with standing and walking. Nothing overtly radicular, pain refers to the posterior lateral thigh. Updating x-rays of her lumbar spine and SI joints, adding meloxicam and aggressive formal physical therapy, return to see me in a month, SI joint injection for diagnostic and therapeutic purposes if no better.  Annual physical exam Due for colon cancer screening. Gets her OB/GYN Pap smears at [place].    ___________________________________________ Gwen Her. [name], M.D., ABFM., CAQSM. Primary Care and [place] Instructor of [place] [facility]

## 2020-11-17 ENCOUNTER — Ambulatory Visit: Payer: Managed Care, Other (non HMO) | Attending: Sports Medicine | Admitting: Physical Therapy

## 2020-11-17 ENCOUNTER — Encounter: Payer: Self-pay | Admitting: Physical Therapy

## 2020-11-17 ENCOUNTER — Other Ambulatory Visit: Payer: Self-pay

## 2020-11-17 DIAGNOSIS — M5417 Radiculopathy, lumbosacral region: Secondary | ICD-10-CM | POA: Diagnosis present

## 2020-11-17 DIAGNOSIS — M6281 Muscle weakness (generalized): Secondary | ICD-10-CM | POA: Diagnosis present

## 2020-11-17 DIAGNOSIS — R262 Difficulty in walking, not elsewhere classified: Secondary | ICD-10-CM | POA: Diagnosis present

## 2020-11-17 DIAGNOSIS — M5441 Lumbago with sciatica, right side: Secondary | ICD-10-CM | POA: Diagnosis present

## 2020-11-17 DIAGNOSIS — M5442 Lumbago with sciatica, left side: Secondary | ICD-10-CM | POA: Insufficient documentation

## 2020-11-17 NOTE — Therapy (Addendum)
[facility] [address]  [place], [place], [address] Phone: [phone]   Fax:  [phone]  Physical Therapy Evaluation / Discharge Summary  Patient Details  Name: Heather Salazar MRN: [id number] Date of Birth: February 07, 1971 Referring Provider (PT): Silverio Decamp, MD   Encounter Date: 11/17/2020   PT End of Session - 11/17/20 1530    Visit Number 1    Date for PT Re-Evaluation 01/12/21    Authorization Type [employer] - VL=60 (combined PT/OT/ST & chiro)    Authorization Time Period benefit period: 04/09/20-04/08/21    PT Start Time 1530    PT Stop Time 1634   Pt had to break to use BR for ~8-10 minutes   PT Time Calculation (min) 64 min    Activity Tolerance Patient tolerated treatment well;Patient limited by pain;Treatment limited secondary to medical complications (Comment)           Past Medical History:  Diagnosis Date  . Anxiety   . Arthritis    back   . Asthma    childhood  . GERD (gastroesophageal reflux disease)   . Hypertension     Past Surgical History:  Procedure Laterality Date  . CESAREAN SECTION    . CHOLECYSTECTOMY N/A 03/06/2018   Procedure: LAPAROSCOPIC CHOLECYSTECTOMY ERAS PATHWAY;  Surgeon: Clovis Riley, MD;  Location: [facility];  Service: General;  Laterality: N/A;  . tubaligation      There were no vitals filed for this visit.    Subjective Assessment - 11/17/20 1533    Subjective Pt reports she has gained weight from taking antidepressants which has led to increasing LBP. LBP currently limits standing and walking tolerance for most daily activities. Pt does note increased urgency with bowel movements where she has experienced some incontinence while trying to get to the bathroom.    Limitations Sitting;Standing;Walking    How long can you sit comfortably? 30-40 min  before muscles tighten up/get stiff    How long can you stand comfortably? <5 minutes    How long can you walk  comfortably? 1-2 minutes    Diagnostic tests 11/12/20 - Lumbar x-ray: Mild degenerative disc disease of L5-S1. Sacrum/coccyx x-ray: Negative with no sclerosis or erosive changes to suggest sacroiliitis.    Patient Stated Goals "I want my back to stop hurting at least to a point where I can manage it"    Currently in Pain? No/denies    Pain Score 0-No pain   when sitting; up to 8/10 when standing or walking   Pain Location Back    Pain Orientation Left;Mid;Lower    Pain Type Acute pain    Pain Radiating Towards intermittent down anterior thighs to just above knees    Pain Onset More than a month ago   Oct 2021   Pain Frequency Intermittent    Aggravating Factors  any activities requiring prolonged standing or walking    Pain Relieving Factors Tylenol, Thermacare strips, back brace    Effect of Pain on Daily Activities needs shower seat to bath, unable to stand to wash dishes or clean house (had to hire a cleaning service), unable to do her grocery shopping w/o motorized cart              [facility] PT Assessment - 11/17/20 1530      Assessment   Medical Diagnosis Lumbar DDD    Referring Provider (PT) Silverio Decamp, MD    Onset Date/Surgical Date --  Oct 2021   Hand Dominance Right    Next MD Visit 12/10/20    Prior Therapy remote h/o PT for lumbar DDD      Precautions   Precautions None      Restrictions   Weight Bearing Restrictions No      Balance Screen   Has the patient fallen in the past 6 months Yes    How many times? 1 - fall in shower (slipped while getting into shower)    Has the patient had a decrease in activity level because of a fear of falling?  No    Is the patient reluctant to leave their home because of a fear of falling?  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, standing to lead training session - has had to switch to working remotely for past 2 weeks d/t LBP    Leisure cooking & baking, window shopping, no regular exercise beyong walking at work      Cognition   Overall Cognitive Status Within Functional Limits for tasks assessed      Observation/Other Assessments   Focus on Therapeutic Outcomes (FOTO)  Lumbar FS = 22, predicted discharge FS = 40      ROM / Strength   AROM / PROM / Strength AROM;Strength      AROM   AROM Assessment Site Lumbar    Lumbar Flexion hands to ankle - tightness & "pressure on bowels"    Lumbar Extension WFL    Lumbar - Right Side Bend hand to fibular head - no pain    Lumbar - Left Side Bend hand to fibular head - no pain    Lumbar - Right Rotation 25% limited - no pain    Lumbar - Left Rotation 25% limited - increased LBP      Strength   Overall Strength Comments testing in stting d/t LBP    Strength Assessment Site Hip;Knee;Ankle    Right/Left Hip Right;Left    Right Hip Flexion 4/5    Right Hip Extension 3-/5    Right Hip External Rotation  4-/5    Right Hip Internal Rotation 4/5    Right Hip ABduction 4/5    Right Hip ADduction 4-/5    Left Hip Flexion 4/5    Left Hip Extension 3-/5    Left Hip External Rotation 4-/5    Left Hip Internal Rotation 4-/5    Left Hip ABduction 4-/5    Left Hip ADduction 3+/5    Right/Left Knee Right;Left    Right Knee Flexion 4+/5    Right Knee Extension 4/5    Left Knee Flexion 4+/5    Left Knee Extension 4/5    Right/Left Ankle Right;Left    Right Ankle Dorsiflexion 4/5    Left Ankle Dorsiflexion 4/5      Flexibility   Soft Tissue Assessment /Muscle Length yes    Hamstrings mod tight B    ITB mod tight B    Piriformis mod  tight B                       Objective measurements completed on examination: See above findings.       [facility] PT Treatment/Exercise - 11/17/20 1530      Exercises   Exercises Lumbar      Lumbar Exercises: Stretches   Passive Hamstring Stretch Left;1 rep;30 seconds    Passive Hamstring Stretch Limitations supine with strap    Single Knee to Chest Stretch Left;1 rep;30 seconds    Lower Trunk Rotation Limitations 10 x 5"    ITB Stretch Left;1 rep;30 seconds    ITB Stretch Limitations supine crossbody with strap    Piriformis Stretch Left;1 rep;30 seconds    Piriformis Stretch Limitations supine KTOS    Figure 4 Stretch 1 rep;30 seconds;Supine;With overpressure      Lumbar Exercises: Supine   Ab Set 5 reps;5 seconds    AB Set Limitations + pelvic floor activation    Pelvic Tilt 5 reps;5 seconds                  PT Education - 11/17/20 1630    Education Details PT eval findings, anticipated POC & initial HEP - Access Code: [email or web address]    Person(s) Educated Patient    Methods Explanation;Demonstration;Verbal cues;Handout    Comprehension Verbalized understanding;Verbal cues required;Returned demonstration;Need further instruction            PT Short Term Goals - 11/17/20 1634      PT SHORT TERM GOAL #1   Title Patient will be independent with initial HEP    Status New    Target Date 12/08/20      PT SHORT TERM GOAL #2   Title Patient will verbalize/demonstrate understanding of neutral spine posture and proper body mechanics to reduce strain on lumbar spine    Status New    Target Date 12/15/20             PT Long Term Goals - 11/17/20 1634      PT LONG TERM GOAL #1   Title Patient will be independent with ongoing/advanced HEP for self-management at home    Status New    Target Date 01/12/21      PT LONG TERM GOAL #2   Title Patient to demonstrate appropriate posture and body mechanics needed for daily activities    Status New    Target Date 01/12/21       PT LONG TERM GOAL #3   Title Decrease LBP pain by >/= 50-75% allowing patient increased ease of daily activities    Status New    Target Date 01/12/21      PT LONG TERM GOAL #4   Title Patient to improve lumbar AROM to WNL without pain provocation    Status New    Target Date 01/12/21      PT LONG TERM GOAL #5   Title Patient will demonstrate improved B LE strength to >/= 4+/5 for improved stability and ease of mobility    Status New    Target Date 01/12/21      PT LONG TERM GOAL #6   Title Patient to report ability to perform ADLs including standing to shower as well as household tasks without increased pain    Status New    Target Date 01/12/21      PT LONG TERM GOAL #7   Title Patient to report ability to perform work-related tasks without  increased pain allowing her to return to the office rather than woorking remotely    Status New    Target Date 01/12/21                  Plan - 11/17/20 1634    Clinical Impression Statement Heather Salazar is a 50 y/o female who presents to OP PT for LBP secondary to lumbar DDD. She reports onsent of LBP in Oct 2021 which she associates with 50+ lb weight gain resulting from anti-depressants she was taking as prescribed by behavioral health (recently stopped by PCP). Pain has become so severe that she is unable to stand or walk for more than a few minutes and consequently has impacted all aspects of her daily life - has to sit to shower and dress, unable to stand to cook or clean, unable to shop for groceries, and has had to switch to working remotely due to LBP. She also notes increased difficulty controlling continence with bowel movements and has experienced some leakage before she is able to reach the BR - pt instructed to notify MD if this worsens. Deficits include B LBP with intermittent B LE radiculopathy into anterior thighs, mildly decreased lumbar ROM, decreased proximal LE flexibility (L>R), ttp over L SIJ, and mild to moderate B  LE weakness (L>R). Heather Salazar will benefit from skilled PT to address above deficits to reduce myofascial pain and tightness and improve core strength to decrease low back pain to allow for increased participation in desired activities with decreased pain interference.    Personal Factors and Comorbidities Comorbidity 3+;Time since onset of injury/illness/exacerbation;Past/Current Experience;Fitness;Profession    Comorbidities Anxiety, depression, HTN, migraines, asthma, GERD, obesity    Examination-Activity Limitations Bathing;Bed Mobility;Bend;Caring for Others;Carry;Continence;Dressing;Hygiene/Grooming;Lift;Locomotion Level;Reach Overhead;Sit;Sleep;Squat;Stairs;Stand;Toileting;Transfers    Examination-Participation Restrictions Cleaning;Community Activity;Driving;Laundry;Meal Prep;Occupation;Shop    Stability/Clinical Decision Making Evolving/Moderate complexity    Clinical Decision Making Moderate    Rehab Potential Good    PT Frequency 2x / week    PT Duration 8 weeks   6-8 weeks   PT Treatment/Interventions ADLs/Self Care Home Management;Cryotherapy;Electrical Stimulation;Iontophoresis 33m/ml Dexamethasone;Moist Heat;Traction;Ultrasound;Gait training;Stair training;Functional mobility training;Therapeutic activities;Therapeutic exercise;Balance training;Neuromuscular re-education;Patient/family education;Manual techniques;Passive range of motion;Dry needling;Taping    PT Next Visit Plan review initial HEP; progress lumbopelvic flexibility and strengthening; manual therapy and modalities PRN    PT Home Exercise Plan MedBridge Access Code: [email or web address])    Consulted and Agree with Plan of Care Patient           Patient will benefit from skilled therapeutic intervention in order to improve the following deficits and impairments:  Decreased activity tolerance,Decreased mobility,Decreased range of motion,Decreased safety awareness,Decreased strength,Difficulty walking,Increased fascial  restricitons,Increased muscle spasms,Impaired perceived functional ability,Impaired flexibility,Improper body mechanics,Postural dysfunction,Pain  Visit Diagnosis: Acute bilateral low back pain with bilateral sciatica - Plan: PT plan of care cert/re-cert  Radiculopathy, lumbosacral region - Plan: PT plan of care cert/re-cert  Difficulty in walking, not elsewhere classified - Plan: PT plan of care cert/re-cert  Muscle weakness (generalized) - Plan: PT plan of care cert/re-cert     Problem List Patient Active Problem List   Diagnosis Date Noted  . Depression, major, single episode, moderate ([place] 11/12/2020  . Lumbar degenerative disc disease 11/12/2020  . Mild intermittent asthma 10/13/2020  . Smoker 11/29/2019  . 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  . Insomnia 03/05/2019  . COVID-19 12/31/2018  . Transaminitis 09/14/2018  . Gastro-esophageal reflux 09/13/2018  . Benign essential hypertension 01/01/2018  . Biliary colic 070/26/3785 . Annual physical exam 01/16/2015  .  Polycystic ovarian syndrome 01/16/2015  . Obesity 01/16/2015    Percival Spanish, PT, MPT 11/17/2020, 6:32 PM  [facility] [address]  [place], [place], [address] Phone: [phone]   Fax:  [phone]  Name: Heather Salazar MRN: [id number] Date of Birth: 18-Aug-1971   PHYSICAL THERAPY DISCHARGE SUMMARY  Visits from Start of Care: 1  Current functional level related to goals / functional outcomes:   Refer to above clinical impression for status as of initial eval on 11/17/2020. Patient cancelled first scheduled treatment visit and no showed for next 4 visits, therefore will proceed with discharge from PT for this episode per Cx/NS policy..   Remaining deficits:   As above.    Education / Equipment:   Initial HEP  Plan: Patient agrees to discharge.  Patient goals were not met. Patient is being discharged due to not returning since the last  visit.  ?????     Percival Spanish, PT, MPT 01/25/21, 8:17 AM  [facility] [address]  [place], [place], [address] Phone: [phone]   Fax:  [phone]

## 2020-11-17 NOTE — Patient Instructions (Addendum)
      Access Code: [email or web address] URL: [email or web address] Date: 11/17/2020 Prepared by: Annie Paras  Exercises Hooklying Hamstring Stretch with Strap - 2 x daily - 7 x weekly - 3 reps - 30 sec hold Supine ITB Stretch with Strap - 2 x daily - 7 x weekly - 3 reps - 30 sec hold Hooklying Single Knee to Chest - 2 x daily - 7 x weekly - 3 reps - 30 sec hold Supine Figure 4 Piriformis Stretch - 2 x daily - 7 x weekly - 3 reps - 30 sec hold Supine Piriformis Stretch with Foot on Ground - 2 x daily - 7 x weekly - 3 reps - 30 sec hold Supine Lower Trunk Rotation - 2 x daily - 7 x weekly - 10 reps - 5 sec hold Supine Transversus Abdominis Bracing - Hands on Stomach - 2 x daily - 7 x weekly - 10 reps - 5 sec hold Supine Posterior Pelvic Tilt - 2 x daily - 7 x weekly - 10 reps - 5 sec hold

## 2020-11-24 ENCOUNTER — Ambulatory Visit: Payer: Managed Care, Other (non HMO)

## 2020-11-26 ENCOUNTER — Ambulatory Visit: Payer: Managed Care, Other (non HMO) | Admitting: Physical Therapy

## 2020-12-01 ENCOUNTER — Ambulatory Visit: Payer: Managed Care, Other (non HMO)

## 2020-12-03 ENCOUNTER — Ambulatory Visit: Payer: Managed Care, Other (non HMO)

## 2020-12-08 ENCOUNTER — Ambulatory Visit: Payer: Managed Care, Other (non HMO) | Attending: Sports Medicine | Admitting: Physical Therapy

## 2020-12-10 ENCOUNTER — Encounter: Payer: Self-pay | Admitting: Sports Medicine

## 2020-12-10 ENCOUNTER — Other Ambulatory Visit: Payer: Self-pay

## 2020-12-10 ENCOUNTER — Ambulatory Visit (INDEPENDENT_AMBULATORY_CARE_PROVIDER_SITE_OTHER): Payer: Managed Care, Other (non HMO) | Admitting: Sports Medicine

## 2020-12-10 DIAGNOSIS — M5136 Other intervertebral disc degeneration, lumbar region: Secondary | ICD-10-CM

## 2020-12-10 DIAGNOSIS — M2550 Pain in unspecified joint: Secondary | ICD-10-CM | POA: Diagnosis not present

## 2020-12-10 DIAGNOSIS — M51369 Other intervertebral disc degeneration, lumbar region without mention of lumbar back pain or lower extremity pain: Secondary | ICD-10-CM

## 2020-12-10 DIAGNOSIS — R590 Localized enlarged lymph nodes: Secondary | ICD-10-CM

## 2020-12-10 DIAGNOSIS — F321 Major depressive disorder, single episode, moderate: Secondary | ICD-10-CM | POA: Diagnosis not present

## 2020-12-10 DIAGNOSIS — R1900 Intra-abdominal and pelvic swelling, mass and lump, unspecified site: Secondary | ICD-10-CM

## 2020-12-10 DIAGNOSIS — R159 Full incontinence of feces: Secondary | ICD-10-CM | POA: Diagnosis not present

## 2020-12-10 MED ORDER — DULOXETINE HCL 30 MG PO CPEP: 30.0000 mg | ORAL_CAPSULE | Freq: Every day | ORAL | 3 refills | Status: DC

## 2020-12-10 MED ORDER — PREDNISONE 50 MG PO TABS: ORAL_TABLET | ORAL | 0 refills | Status: DC

## 2020-12-10 NOTE — Assessment & Plan Note (Signed)
Persistent axial low back pain, referable to the left sacroiliac joint and worse with standing and walking. Nothing overtly radicular but she does get pain radiating to the posterior/lateral thigh. X-rays showed multilevel degenerative changes. We added meloxicam. Formal PT which she has only done a session. At this point due to failure of greater than 6 weeks of conservative treatment we are going to proceed with prednisone and an MRI of the lumbar spine, of note she is having some stool incontinence.

## 2020-12-10 NOTE — Assessment & Plan Note (Signed)
Self discontinued Zoloft, we will restart an antidepressant as her PHQ and gad scores are high, she also has significant arthralgias. Restarting with Cymbalta 30, return to see me in 6 weeks for PHQ/GAD.

## 2020-12-10 NOTE — Assessment & Plan Note (Signed)
Full rheumatoid work-up, if negative we will consider fibromyalgia as the cause.

## 2020-12-10 NOTE — Assessment & Plan Note (Signed)
Typically occurs with Valsalva, straining and walking. I do not think this is related to cauda equina syndrome, I would like gastroenterology to weigh in.  No overt diarrhea.

## 2020-12-10 NOTE — Progress Notes (Addendum)
° ° °  Procedures performed today:    None.  Independent interpretation of notes and tests performed by another provider:   None.  Brief History, Exam, Impression, and Recommendations:    Depression, major, single episode, moderate ([place]) Self discontinued Zoloft, we will restart an antidepressant as her PHQ and gad scores are high, she also has significant arthralgias. Restarting with Cymbalta 30, return to see me in 6 weeks for PHQ/GAD.  Lumbar degenerative disc disease Persistent axial low back pain, referable to the left sacroiliac joint and worse with standing and walking. Nothing overtly radicular but she does get pain radiating to the posterior/lateral thigh. X-rays showed multilevel degenerative changes. We added meloxicam. Formal PT which she has only done a session. At this point due to failure of greater than 6 weeks of conservative treatment we are going to proceed with prednisone and an MRI of the lumbar spine, of note she is having some stool incontinence.  Stool incontinence Typically occurs with Valsalva, straining and walking. I do not think this is related to cauda equina syndrome, I would like gastroenterology to weigh in.  No overt diarrhea.  Polyarthralgia Full rheumatoid work-up, if negative we will consider fibromyalgia as the cause.  Retroperitoneal lymphadenopathy Incidentally noted is copious retroperitoneal lymphadenopathy, for this reason we are going to get an abdominal MRI with and without contrast to evaluate for any pelvic neoplasia.    ___________________________________________ Gwen Her. [name], M.D., ABFM., CAQSM. Primary Care and [place] Instructor of [place] [employer] [facility]

## 2020-12-14 DIAGNOSIS — M5136 Other intervertebral disc degeneration, lumbar region: Secondary | ICD-10-CM | POA: Diagnosis not present

## 2020-12-14 DIAGNOSIS — M51369 Other intervertebral disc degeneration, lumbar region without mention of lumbar back pain or lower extremity pain: Secondary | ICD-10-CM

## 2020-12-15 ENCOUNTER — Encounter: Payer: Managed Care, Other (non HMO) | Admitting: Physical Therapy

## 2020-12-15 MED ORDER — TRAMADOL HCL 50 MG PO TABS: 50.0000 mg | ORAL_TABLET | Freq: Three times a day (TID) | ORAL | 0 refills | Status: DC | PRN

## 2020-12-15 MED ORDER — DICLOFENAC SODIUM 75 MG PO TBEC: 75.0000 mg | DELAYED_RELEASE_TABLET | Freq: Two times a day (BID) | ORAL | 3 refills | Status: AC

## 2020-12-15 NOTE — Telephone Encounter (Signed)
I spent 5 total minutes of online digital evaluation and management services.

## 2020-12-16 ENCOUNTER — Encounter: Payer: Self-pay | Admitting: Gastroenterology

## 2020-12-16 ENCOUNTER — Telehealth: Payer: Self-pay | Admitting: Sports Medicine

## 2020-12-16 NOTE — Telephone Encounter (Signed)
MRI peer-to-peer performed, approved with auth [id number] expiration 3/3-4/3.

## 2020-12-17 ENCOUNTER — Encounter: Payer: Self-pay | Admitting: Sports Medicine

## 2020-12-17 ENCOUNTER — Encounter: Payer: Managed Care, Other (non HMO) | Admitting: Physical Therapy

## 2020-12-19 ENCOUNTER — Ambulatory Visit (INDEPENDENT_AMBULATORY_CARE_PROVIDER_SITE_OTHER): Payer: Managed Care, Other (non HMO)

## 2020-12-19 ENCOUNTER — Other Ambulatory Visit: Payer: Self-pay

## 2020-12-19 DIAGNOSIS — M5136 Other intervertebral disc degeneration, lumbar region: Secondary | ICD-10-CM

## 2020-12-21 DIAGNOSIS — R590 Localized enlarged lymph nodes: Secondary | ICD-10-CM | POA: Insufficient documentation

## 2020-12-21 NOTE — Addendum Note (Signed)
Addended by: Silverio Decamp on: 12/21/2020 10:22 AM   Modules accepted: Orders

## 2020-12-21 NOTE — Assessment & Plan Note (Signed)
Incidentally noted is copious retroperitoneal lymphadenopathy, for this reason we are going to get an abdominal MRI with and without contrast to evaluate for any pelvic neoplasia.

## 2020-12-29 ENCOUNTER — Other Ambulatory Visit: Payer: Self-pay

## 2020-12-29 ENCOUNTER — Encounter: Payer: Self-pay | Admitting: Gastroenterology

## 2020-12-29 ENCOUNTER — Ambulatory Visit (INDEPENDENT_AMBULATORY_CARE_PROVIDER_SITE_OTHER): Payer: Managed Care, Other (non HMO) | Admitting: Gastroenterology

## 2020-12-29 VITALS — BP 128/70 | HR 73 | Ht 65.0 in | Wt 272.2 lb

## 2020-12-29 DIAGNOSIS — R933 Abnormal findings on diagnostic imaging of other parts of digestive tract: Secondary | ICD-10-CM

## 2020-12-29 DIAGNOSIS — R159 Full incontinence of feces: Secondary | ICD-10-CM | POA: Diagnosis not present

## 2020-12-29 DIAGNOSIS — R635 Abnormal weight gain: Secondary | ICD-10-CM | POA: Diagnosis not present

## 2020-12-29 DIAGNOSIS — R195 Other fecal abnormalities: Secondary | ICD-10-CM

## 2020-12-29 MED ORDER — CLENPIQ 10-3.5-12 MG-GM -GM/160ML PO SOLN: 1.0000 | ORAL | 0 refills | Status: DC

## 2020-12-29 NOTE — Progress Notes (Signed)
Chief Complaint: Fecal incontinence   Referring Provider:     Silverio Decamp, MD   HPI:     Heather Salazar is a 50 y.o. female with a history of HTN, arthritis, GERD, cholecystectomy, depression, obesity (BMI 63), referred to the [facility] for evaluation of fecal incontinence.  Sxs started approx 3 months ago after gaining 50# over the preceding 3 months and developing back pain. Sxs occur when walking any distance and will have fecal urgency and a few episodes of urge fecal incontinence. Having 5-6 BM/day (baseline was 1 BM QOD). Outside fo exertional activity, does not have increased frequency or urgency. +sxs when at Physical Therapy for back issues (again, sxs with exertion). Stools are soft, but not watery. No urinary sxs. No hematochezia or melena. No abdominal pain, n/v/f/c.   Has not trialed any meds, OTC, dietary mods, etc for these sxs. Weight stable since the 50# gain.   Colonoscopy in 2018 at [facility] as below.  No prior EGD.  MRI L-spine on 12/19/2020 with L4-5 and L5-S1 disc degeneration without neural impingement along with retroperitoneal lymph nodes of questionable clinical significance.  MRI abdomen/pelvis already ordered by Dr. [name] but not yet scheduled by patient.  Labs also ordered earlier this month, but not yet completed by patient.  No known family history of CRC, GI malignancy, liver disease, pancreatic disease, or IBD.   Endoscopic History: -Colonoscopy (10/2016, digestive health): Sigmoid diverticulosis, sigmoid erythema at 25-35 cm (path: Benign), 3-4 mm rectal hyperplastic polyp.  Normal TI   Past Medical History:  Diagnosis Date  . Anxiety   . Arthritis    back   . Asthma    childhood  . GERD (gastroesophageal reflux disease)   . History of colon polyps   . Hypertension      Past Surgical History:  Procedure Laterality Date  . CESAREAN SECTION    . CHOLECYSTECTOMY N/A 03/06/2018    Procedure: LAPAROSCOPIC CHOLECYSTECTOMY ERAS PATHWAY;  Surgeon: Clovis Riley, MD;  Location: [facility];  Service: General;  Laterality: N/A;  . COLONOSCOPY     In [place] [phone] did have a small polyp  . tubaligation     Family History  Problem Relation Age of Onset  . Asthma Mother   . Heart failure Father   . Asthma Sister   . Asthma Brother   . Diabetes Neg Hx   . Colon cancer Neg Hx   . Esophageal cancer Neg Hx    Social History   Tobacco Use  . Smoking status: Current Every Day Smoker    Packs/day: 0.50    Types: Cigarettes  . Smokeless tobacco: Never Used  Vaping Use  . Vaping Use: Never used  Substance Use Topics  . Alcohol use: Yes    Comment: weekends   . Drug use: No   Current Outpatient Medications  Medication Sig Dispense Refill  . acetaminophen (TYLENOL 8 HOUR ARTHRITIS PAIN) 650 MG CR tablet Take 1,300 mg by mouth daily with breakfast.    . albuterol (VENTOLIN HFA) 108 (90 Base) MCG/ACT inhaler Inhale 2 puffs into the lungs every 4 (four) hours as needed for wheezing or shortness of breath. 18 g 0  . clonazePAM (KLONOPIN) 0.5 MG tablet Take 0.5 mg by mouth as needed.    . diclofenac (VOLTAREN) 75 MG EC tablet Take 1 tablet (75 mg total) by mouth 2 (two) times daily. [place]  tablet 3  . DULoxetine (CYMBALTA) 30 MG capsule Take 1 capsule (30 mg total) by mouth daily. 30 capsule 3  . fluticasone (FLONASE) 50 MCG/ACT nasal spray PLACE 1-2 SPRAYS INTO BOTH NOSTRILS DAILY. 48 mL 4  . fluticasone (FLOVENT HFA) 110 MCG/ACT inhaler Inhale 1 puff into the lungs 2 (two) times daily. 1 each 12  . rizatriptan (MAXALT-MLT) 5 MG disintegrating tablet Take 1 tablet (5 mg total) by mouth as needed for migraine. May repeat in 2 hours if needed 10 tablet 3  . topiramate (TOPAMAX) 50 MG tablet ONE HALF TAB BY MOUTH DAILY FOR ONE WEEK THEN ONE TAB BY MOUTH DAILY. (Patient taking differently: Take 50 mg by mouth daily.) 90 tablet 1  .  valsartan-hydrochlorothiazide (DIOVAN-HCT) 320-25 MG tablet TAKE 1/2 TABLET BY MOUTH EVERY DAY 45 tablet 0  . zolpidem (AMBIEN) 5 MG tablet Take 1 tablet (5 mg total) by mouth at bedtime as needed for sleep. 30 tablet 1  . hyoscyamine (LEVSIN) 0.125 MG tablet Take one by mouth every 4-6 hours as needed for abdominal cramping or pain or diarrhea. (Patient not taking: Reported on 12/29/2020) 15 tablet 0  . traMADol (ULTRAM) 50 MG tablet Take 1-2 tablets (50-100 mg total) by mouth every 8 (eight) hours as needed for moderate pain. Maximum 6 tabs per day. (Patient not taking: Reported on 12/29/2020) 21 tablet 0   No current facility-administered medications for this visit.   Allergies  Allergen Reactions  . Acetaminophen-Codeine Other (See Comments)    Shortness of breath. This was the codeine component, not tylenol.  . Iodine Solution [Povidone Iodine] Rash  . Oxycodone Itching     Review of Systems: All systems reviewed and negative except where noted in HPI.     Physical Exam:    Wt Readings from Last 3 Encounters:  12/29/20 272 lb 4 oz (123.5 kg)  12/10/20 265 lb (120.2 kg)  11/12/20 269 lb (122 kg)    BP 128/70   Pulse 73   Ht 5' 5"  (1.651 m)   Wt 272 lb 4 oz (123.5 kg)   LMP 09/06/2018 (Exact Date) Comment: patient signed preg test waiver  BMI 45.30 kg/m  Constitutional:  Pleasant, in no acute distress. Psychiatric: Normal mood and affect. Behavior is normal. EENT: Pupils normal.  Conjunctivae are normal. No scleral icterus. Neck supple. No cervical LAD. Cardiovascular: Normal rate, regular rhythm. No edema Pulmonary/chest: Effort normal and breath sounds normal. No wheezing, rales or rhonchi. Abdominal: Soft, nondistended, nontender. Bowel sounds active throughout. There are no masses palpable. No hepatomegaly. Neurological: Alert and oriented to person place and time. Skin: Skin is warm and dry. No rashes noted.   ASSESSMENT AND PLAN;   1) Change in bowel habits 2)  Fecal urgency 3) Fecal incontinence/urge incontinence 4) Weight gain/Obesity 5) Abnormal MRI  -Start fiber supplement and increase dietary fiber - Colonoscopy to evaluate for mucosal/luminal pathology - f/u pending MRI for evaluation of lymph nodes noted at time of MRI L-spine -If work-up unrevealing and ongoing symptoms, consider ARM  The indications, risks, and benefits of colonoscopy were explained to the patient in detail. Risks include but are not limited to bleeding, perforation, adverse reaction to medications, and cardiopulmonary compromise. Sequelae include but are not limited to the possibility of surgery, hospitalization, and mortality. The patient verbalized understanding and wished to proceed. All questions answered, referred to the scheduler and bowel prep ordered. Further recommendations pending results of the exam.      Lavena Bullion,  DO, FACG  12/29/2020, 8:45 AM   Silverio Decamp,*

## 2020-12-29 NOTE — Patient Instructions (Addendum)
If you are age 49 or older, your body mass index should be between 23-30. Your Body mass index is 45.3 kg/m. If this is out of the aforementioned range listed, please consider follow up with your Primary Care Provider.  If you are age 57 or younger, your body mass index should be between 19-25. Your Body mass index is 45.3 kg/m. If this is out of the aformentioned range listed, please consider follow up with your Primary Care Provider.    Please start taking a fiber supplement for example: Metamucil, benefiber  Due to recent changes in healthcare laws, you may see the results of your imaging and laboratory studies on MyChart before your provider has had a chance to review them.  We understand that in some cases there may be results that are confusing or concerning to you. Not all laboratory results come back in the same time frame and the provider may be waiting for multiple results in order to interpret others.  Please give [place] 48 hours in order for your provider to thoroughly review all the results before contacting the office for clarification of your results.  Thank you for choosing me and [facility].  Vito Cirigliano, D.O.

## 2020-12-30 ENCOUNTER — Other Ambulatory Visit: Payer: Self-pay | Admitting: Sports Medicine

## 2020-12-30 ENCOUNTER — Telehealth: Payer: Self-pay | Admitting: Sports Medicine

## 2020-12-30 DIAGNOSIS — R590 Localized enlarged lymph nodes: Secondary | ICD-10-CM

## 2020-12-30 DIAGNOSIS — R599 Enlarged lymph nodes, unspecified: Secondary | ICD-10-CM

## 2020-12-30 MED ORDER — PREDNISONE 50 MG PO TABS: ORAL_TABLET | ORAL | 0 refills | Status: DC

## 2020-12-30 NOTE — Telephone Encounter (Signed)
Peer-to-peer conducted for pelvic MRI for diagnosis of copious pelvic lymphadenopathy, it was decided that we would be better off using a CT with contrast of the abdomen and pelvis, I went ahead and got this approved during the peer-to-peer, approval #[id number].  Please go ahead with scheduling abdominal and pelvic CT with and without oral and IV contrast.

## 2021-01-06 ENCOUNTER — Other Ambulatory Visit: Payer: Self-pay | Admitting: Sports Medicine

## 2021-01-06 DIAGNOSIS — F321 Major depressive disorder, single episode, moderate: Secondary | ICD-10-CM

## 2021-01-13 ENCOUNTER — Telehealth (INDEPENDENT_AMBULATORY_CARE_PROVIDER_SITE_OTHER): Payer: Managed Care, Other (non HMO) | Admitting: Sports Medicine

## 2021-01-13 DIAGNOSIS — J4520 Mild intermittent asthma, uncomplicated: Secondary | ICD-10-CM | POA: Diagnosis not present

## 2021-01-13 DIAGNOSIS — F321 Major depressive disorder, single episode, moderate: Secondary | ICD-10-CM

## 2021-01-13 MED ORDER — LORAZEPAM 0.5 MG PO TABS: 0.5000 mg | ORAL_TABLET | Freq: Three times a day (TID) | ORAL | 0 refills | Status: DC | PRN

## 2021-01-13 MED ORDER — FEXOFENADINE HCL 180 MG PO TABS: 180.0000 mg | ORAL_TABLET | Freq: Every day | ORAL | 3 refills | Status: DC

## 2021-01-13 NOTE — Assessment & Plan Note (Signed)
She is also has mild intermittent asthma, increasing wheezing, coughing. No fevers, chills, Covid test negative. Muscle aches. She thinks this is due to allergy as she does have some runny nose and itchy eyes, adding fexofenadine, if she does not turn the corner by the end of this week we will need to lay a stethoscope on her chest.

## 2021-01-13 NOTE — Progress Notes (Signed)
   Virtual Visit via WebEx/MyChart   I connected with  Heather Salazar  on 01/13/21 via WebEx/MyChart/Doximity Video and verified that I am speaking with the correct person using two identifiers.   I discussed the limitations, risks, security and privacy concerns of performing an evaluation and management service by WebEx/MyChart/Doximity Video, including the higher likelihood of inaccurate diagnosis and treatment, and the availability of in person appointments.  We also discussed the likely need of an additional face to face encounter for complete and high quality delivery of care.  I also discussed with the patient that there may be a patient responsible charge related to this service. The patient expressed understanding and wishes to proceed.  Provider location is in medical facility. Patient location is at their home, different from provider location. People involved in care of the patient during this telehealth encounter were myself, my nurse/medical assistant, and my front office/scheduling team member.  Review of Systems: No fevers, chills, night sweats, weight loss, chest pain, or shortness of breath.   Objective Findings:    General: Speaking full sentences, no audible heavy breathing.  Sounds alert and appropriately interactive.  Appears well.  Face symmetric.  Extraocular movements intact.  Pupils equal and round.  No nasal flaring or accessory muscle use visualized.  Independent interpretation of tests performed by another provider:   None.  Brief History, Exam, Impression, and Recommendations:    Depression, major, single episode, moderate ([place]) This is a pleasant 50 year old female, she calls in the onset, she has had some sniffles, runny nose, but also severe increase in anxiety, her daughter recently attempted suicide. She is having increasing tearfulness, nausea, coughing and simply going back. She does have Cymbalta, Ambien help her sleep, no suicidal homicidal ideation  or self. She does need a rapid acting anxiolytic and Klonopin is not helping significantly so we will switch to Ativan per her request. She will keep close follow-up with me.  Mild intermittent asthma She is also has mild intermittent asthma, increasing wheezing, coughing. No fevers, chills, Covid test negative. Muscle aches. She thinks this is due to allergy as she does have some runny nose and itchy eyes, adding fexofenadine, if she does not turn the corner by the end of this week we will need to lay a stethoscope on her chest.   I discussed the above assessment and treatment plan with the patient. The patient was provided an opportunity to ask questions and all were answered. The patient agreed with the plan and demonstrated an understanding of the instructions.   The patient was advised to call back or seek an in-person evaluation if the symptoms worsen or if the condition fails to improve as anticipated.   I provided 30 minutes of face to face and non-face-to-face time during this encounter date, time was needed to gather information, review chart, records, communicate/coordinate with staff remotely, as well as complete documentation.   ___________________________________________ Gwen Her. [name], M.D., ABFM., CAQSM. Primary Care and [place] Instructor of [place] of [facility]

## 2021-01-13 NOTE — Assessment & Plan Note (Signed)
This is a pleasant 50 year old female, she calls in the onset, she has had some sniffles, runny nose, but also severe increase in anxiety, her daughter recently attempted suicide. She is having increasing tearfulness, nausea, coughing and simply going back. She does have Cymbalta, Ambien help her sleep, no suicidal homicidal ideation or self. She does need a rapid acting anxiolytic and Klonopin is not helping significantly so we will switch to Ativan per her request. She will keep close follow-up with me.

## 2021-01-21 ENCOUNTER — Other Ambulatory Visit: Payer: Self-pay

## 2021-01-21 ENCOUNTER — Ambulatory Visit: Payer: Managed Care, Other (non HMO) | Admitting: Sports Medicine

## 2021-01-21 DIAGNOSIS — F321 Major depressive disorder, single episode, moderate: Secondary | ICD-10-CM

## 2021-01-21 DIAGNOSIS — M51369 Other intervertebral disc degeneration, lumbar region without mention of lumbar back pain or lower extremity pain: Secondary | ICD-10-CM

## 2021-01-21 DIAGNOSIS — J4540 Moderate persistent asthma, uncomplicated: Secondary | ICD-10-CM

## 2021-01-21 DIAGNOSIS — M5136 Other intervertebral disc degeneration, lumbar region: Secondary | ICD-10-CM

## 2021-01-21 MED ORDER — DULOXETINE HCL 60 MG PO CPEP: 60.0000 mg | ORAL_CAPSULE | Freq: Every day | ORAL | 11 refills | Status: DC

## 2021-01-21 MED ORDER — LORAZEPAM 0.5 MG PO TABS: 0.5000 mg | ORAL_TABLET | Freq: Three times a day (TID) | ORAL | 0 refills | Status: DC | PRN

## 2021-01-21 MED ORDER — BREO ELLIPTA 100-25 MCG/INH IN AEPB: 1.0000 | INHALATION_SPRAY | Freq: Every day | RESPIRATORY_TRACT | 11 refills | Status: DC

## 2021-01-21 NOTE — Assessment & Plan Note (Signed)
A little bit better with Allegra, still has some inspiratory and mild expiratory wheezing with prolonged aspiratory phases, significant nighttime symptoms 3 times a week. I do think we are going to need a controller inhaler, adding Breo, continue albuterol as needed and continue fexofenadine at least through May.

## 2021-01-21 NOTE — Assessment & Plan Note (Signed)
Bilateral right worse than left low back pain. I did review her MRI which shows degenerative disc disease with desiccation from L4-S1, broad-based at the L5-S1 level with epidural lipomatosis. Proceeding with a right L5-S1 interlaminar epidural. Return to see me 1 month after injection to evaluate relief.

## 2021-01-21 NOTE — Progress Notes (Signed)
    Procedures performed today:    None.  Independent interpretation of notes and tests performed by another provider:   None.  Brief History, Exam, Impression, and Recommendations:    Lumbar degenerative disc disease Bilateral right worse than left low back pain. I did review her MRI which shows degenerative disc disease with desiccation from L4-S1, broad-based at the L5-S1 level with epidural lipomatosis. Proceeding with a right L5-S1 interlaminar epidural. Return to see me 1 month after injection to evaluate relief.  Depression, major, single episode, moderate ([place]) This is a very pleasant 50 year old female, her daughter recently attempted suicide, she is doing better, we added some Ativan, seems to be working well for acute symptoms, increasing Cymbalta to 60 mg daily, repeat PHQ/GAD in 6 weeks, I would also like to add some behavioral therapy. Return to see me in 6 weeks for this.  Persistent asthma without complication A little bit better with Allegra, still has some inspiratory and mild expiratory wheezing with prolonged aspiratory phases, significant nighttime symptoms 3 times a week. I do think we are going to need a controller inhaler, adding Breo, continue albuterol as needed and continue fexofenadine at least through May.    ___________________________________________ Gwen Her. [name], M.D., ABFM., CAQSM. Primary Care and [place] Instructor of [place] of [facility]

## 2021-01-21 NOTE — Assessment & Plan Note (Signed)
This is a very pleasant 50 year old female, her daughter recently attempted suicide, she is doing better, we added some Ativan, seems to be working well for acute symptoms, increasing Cymbalta to 60 mg daily, repeat PHQ/GAD in 6 weeks, I would also like to add some behavioral therapy. Return to see me in 6 weeks for this.

## 2021-01-24 ENCOUNTER — Other Ambulatory Visit: Payer: Self-pay | Admitting: Sports Medicine

## 2021-01-24 DIAGNOSIS — I10 Essential (primary) hypertension: Secondary | ICD-10-CM

## 2021-01-26 LAB — CBC WITH DIFFERENTIAL/PLATELET
Absolute Monocytes: 490 cells/uL (ref 200–950)
Basophils Absolute: 49 cells/uL (ref 0–200)
Basophils Relative: 1 %
Eosinophils Absolute: 402 cells/uL (ref 15–500)
Eosinophils Relative: 8.2 %
HCT: 42.3 % (ref 35.0–45.0)
Hemoglobin: 14.1 g/dL (ref 11.7–15.5)
Lymphs Abs: 1715 cells/uL (ref 850–3900)
MCH: 29.4 pg (ref 27.0–33.0)
MCHC: 33.3 g/dL (ref 32.0–36.0)
MCV: 88.1 fL (ref 80.0–100.0)
MPV: 11.2 fL (ref 7.5–12.5)
Monocytes Relative: 10 %
Neutro Abs: 2244 cells/uL (ref 1500–7800)
Neutrophils Relative %: 45.8 %
Platelets: 233 10*3/uL (ref 140–400)
RBC: 4.8 10*6/uL (ref 3.80–5.10)
RDW: 16.2 % — ABNORMAL HIGH (ref 11.0–15.0)
Total Lymphocyte: 35 %
WBC: 4.9 10*3/uL (ref 3.8–10.8)

## 2021-01-26 LAB — COMPREHENSIVE METABOLIC PANEL
AG Ratio: 1.2 (calc) (ref 1.0–2.5)
ALT: 93 U/L — ABNORMAL HIGH (ref 6–29)
AST: 134 U/L — ABNORMAL HIGH (ref 10–35)
Albumin: 4.1 g/dL (ref 3.6–5.1)
Alkaline phosphatase (APISO): 129 U/L — ABNORMAL HIGH (ref 31–125)
BUN: 10 mg/dL (ref 7–25)
CO2: 26 mmol/L (ref 20–32)
Calcium: 9.5 mg/dL (ref 8.6–10.2)
Chloride: 104 mmol/L (ref 98–110)
Creat: 0.88 mg/dL (ref 0.50–1.10)
Globulin: 3.3 g/dL (calc) (ref 1.9–3.7)
Glucose, Bld: 85 mg/dL (ref 65–99)
Potassium: 4 mmol/L (ref 3.5–5.3)
Sodium: 142 mmol/L (ref 135–146)
Total Bilirubin: 0.8 mg/dL (ref 0.2–1.2)
Total Protein: 7.4 g/dL (ref 6.1–8.1)

## 2021-01-26 LAB — LUPUS(12) PANEL
Anti Nuclear Antibody (ANA): NEGATIVE
C3 Complement: 171 mg/dL (ref 83–193)
C4 Complement: 26 mg/dL (ref 15–57)
ENA SM Ab Ser-aCnc: <1 AI
Rheumatoid fact SerPl-aCnc: <14 IU/mL (ref <14)
Ribosomal P Protein Ab: <1 AI
SM/RNP: <1 AI
SSA (Ro) (ENA) Antibody, IgG: <1 AI
SSB (La) (ENA) Antibody, IgG: <1 AI
Scleroderma (Scl-70) (ENA) Antibody, IgG: <1 AI
Thyroperoxidase Ab SerPl-aCnc: 1 IU/mL (ref <9)
ds DNA Ab: <1 IU/mL

## 2021-01-26 LAB — RHEUMATOID FACTOR (IGA, IGG, IGM)
Rheumatoid Factor (IgA): <5 U (ref <=6)
Rheumatoid Factor (IgG): <5 U (ref <=6)
Rheumatoid Factor (IgM): 9 U — ABNORMAL HIGH (ref <=6)

## 2021-01-26 LAB — SEDIMENTATION RATE: Sed Rate: 2 mm/h (ref 0–20)

## 2021-01-26 LAB — URIC ACID: Uric Acid, Serum: 7.4 mg/dL — ABNORMAL HIGH (ref 2.5–7.0)

## 2021-01-26 LAB — CYCLIC CITRUL PEPTIDE ANTIBODY, IGG: Cyclic Citrullin Peptide Ab: <16 UNITS

## 2021-01-26 LAB — CK: Total CK: 73 U/L (ref 29–143)

## 2021-01-27 ENCOUNTER — Encounter: Payer: Self-pay | Admitting: Gastroenterology

## 2021-01-29 ENCOUNTER — Telehealth: Payer: Self-pay

## 2021-01-29 MED ORDER — PREDNISONE 50 MG PO TABS: ORAL_TABLET | ORAL | 0 refills | Status: DC

## 2021-01-29 NOTE — Telephone Encounter (Signed)
Phone call to patient to review instructions for 13 hr prep for epidural steroid injection with CT contrast  on 02/02/21  at 12:30. Prescription called into [facility]. Pt aware and verbalized understanding of instructions. Pt reports she does not need benadryl sent in as a prescription as she has 50 mg at home. Also, advised pt to have a driver the day of her procedure. Pt verbalized understanding of the times she should take her medications.

## 2021-02-02 ENCOUNTER — Ambulatory Visit (INDEPENDENT_AMBULATORY_CARE_PROVIDER_SITE_OTHER): Payer: 59 | Admitting: Professional

## 2021-02-02 ENCOUNTER — Ambulatory Visit
Admission: RE | Admit: 2021-02-02 | Discharge: 2021-02-02 | Disposition: A | Discharge status: Home / Self Care | Payer: Managed Care, Other (non HMO) | Source: Ambulatory Visit | Attending: Sports Medicine | Admitting: Sports Medicine

## 2021-02-02 ENCOUNTER — Other Ambulatory Visit: Payer: Self-pay

## 2021-02-02 DIAGNOSIS — F4323 Adjustment disorder with mixed anxiety and depressed mood: Secondary | ICD-10-CM | POA: Diagnosis not present

## 2021-02-02 DIAGNOSIS — M5136 Other intervertebral disc degeneration, lumbar region: Secondary | ICD-10-CM

## 2021-02-02 MED ORDER — METHYLPREDNISOLONE ACETATE 40 MG/ML INJ SUSP (RADIOLOG
80.0000 mg | Freq: Once | INTRAMUSCULAR | Status: AC
  Administered 2021-02-02: 80 mg via EPIDURAL

## 2021-02-02 MED ORDER — IOPAMIDOL (ISOVUE-M 200) INJECTION 41%
1.0000 mL | Freq: Once | INTRAMUSCULAR | Status: AC
  Administered 2021-02-02: 1 mL via EPIDURAL

## 2021-02-02 NOTE — Discharge Instructions (Signed)

## 2021-02-02 NOTE — Progress Notes (Signed)
Pt reports she took her 13 hr prep. Pt has no visible rash after injection. Pt denies itching or any other complaints at this time.

## 2021-02-03 ENCOUNTER — Telehealth: Payer: Self-pay | Admitting: Gastroenterology

## 2021-02-03 NOTE — Telephone Encounter (Signed)
[facility] called states [facility] told her they do not have her prep medication and her procedure is tomorrow 02/04/21 needs it before 5 pm today. Also call patient once done.

## 2021-02-03 NOTE — Telephone Encounter (Signed)
Held to speak with [facility] at [facility] for 13mn[facility]. Someone picked up and stated the pharmacist would be with me soon. Unable to wait to speak with the pharmacist so I  Called back h [name] -she was given a miralax/ducolax prep at the last minute because clenpiq was not picked up. The patient verbalized understanding and assured me she had not eaten today. She will follow prep accordingly and make her colonoscopy tomorrpw

## 2021-02-03 NOTE — Telephone Encounter (Signed)
Called Ziggy regarding her prep and explained her Clenpiq was sent on the day she was in and shows it was received by [facility], she states she has picked up many prescriptions this month and it was not included with any of the bags. Told her that I would call [facility] and figure out where the prescription is. The patient stated she will wait by the phone for me to call her back. She assures me she has been empty today and ready to start her prep.

## 2021-02-04 ENCOUNTER — Encounter: Payer: Self-pay | Admitting: Gastroenterology

## 2021-02-04 ENCOUNTER — Other Ambulatory Visit: Payer: Self-pay

## 2021-02-04 ENCOUNTER — Ambulatory Visit (AMBULATORY_SURGERY_CENTER): Payer: Managed Care, Other (non HMO) | Admitting: Gastroenterology

## 2021-02-04 VITALS — BP 118/78 | HR 80 | Temp 95.8°F | Resp 24 | Ht 65.0 in | Wt 272.0 lb

## 2021-02-04 DIAGNOSIS — D120 Benign neoplasm of cecum: Secondary | ICD-10-CM

## 2021-02-04 DIAGNOSIS — D125 Benign neoplasm of sigmoid colon: Secondary | ICD-10-CM

## 2021-02-04 DIAGNOSIS — R195 Other fecal abnormalities: Secondary | ICD-10-CM

## 2021-02-04 DIAGNOSIS — K621 Rectal polyp: Secondary | ICD-10-CM

## 2021-02-04 DIAGNOSIS — K6289 Other specified diseases of anus and rectum: Secondary | ICD-10-CM | POA: Diagnosis not present

## 2021-02-04 DIAGNOSIS — D128 Benign neoplasm of rectum: Secondary | ICD-10-CM

## 2021-02-04 DIAGNOSIS — D129 Benign neoplasm of anus and anal canal: Secondary | ICD-10-CM

## 2021-02-04 MED ORDER — SODIUM CHLORIDE 0.9 % IV SOLN: 500.0000 mL | Freq: Once | INTRAVENOUS | Status: DC

## 2021-02-04 NOTE — Progress Notes (Signed)
Pt's states no medical or surgical changes since previsit or office visit. 

## 2021-02-04 NOTE — Patient Instructions (Signed)
Handout given for polyps.  Await pathology results.  Use Fiber supplements like Citrucel, Fibercon, Konsyl or Metamucil.  YOU HAD AN ENDOSCOPIC PROCEDURE TODAY AT [facility]:   Refer to the procedure report that was given to you for any specific questions about what was found during the examination.  If the procedure report does not answer your questions, please call your gastroenterologist to clarify.  If you requested that your care partner not be given the details of your procedure findings, then the procedure report has been included in a sealed envelope for you to review at your convenience later.  YOU SHOULD EXPECT: Some feelings of bloating in the abdomen. Passage of more gas than usual.  Walking can help get rid of the air that was put into your GI tract during the procedure and reduce the bloating. If you had a lower endoscopy (such as a colonoscopy or flexible sigmoidoscopy) you may notice spotting of blood in your stool or on the toilet paper. If you underwent a bowel prep for your procedure, you may not have a normal bowel movement for a few days.  Please Note:  You might notice some irritation and congestion in your nose or some drainage.  This is from the oxygen used during your procedure.  There is no need for concern and it should clear up in a day or so.  SYMPTOMS TO REPORT IMMEDIATELY:   Following lower endoscopy (colonoscopy or flexible sigmoidoscopy):  Excessive amounts of blood in the stool  Significant tenderness or worsening of abdominal pains  Swelling of the abdomen that is new, acute  Fever of 100F or higher  For urgent or emergent issues, a gastroenterologist can be reached at any hour by calling [phone]. Do not use MyChart messaging for urgent concerns.    DIET:  We do recommend a small meal at first, but then you may proceed to your regular diet.  Drink plenty of fluids but you should avoid alcoholic beverages for 24 hours.  ACTIVITY:   You should plan to take it easy for the rest of today and you should NOT DRIVE or use heavy machinery until tomorrow (because of the sedation medicines used during the test).    FOLLOW UP: Our staff will call the number listed on your records 48-72 hours following your procedure to check on you and address any questions or concerns that you may have regarding the information given to you following your procedure. If we do not reach you, we will leave a message.  We will attempt to reach you two times.  During this call, we will ask if you have developed any symptoms of COVID 19. If you develop any symptoms (ie: fever, flu-like symptoms, shortness of breath, cough etc.) before then, please call [phone].  If you test positive for Covid 19 in the 2 weeks post procedure, please call and report this information to [place].    If any biopsies were taken you will be contacted by phone or by letter within the next 1-3 weeks.  Please call us at [phone] if you have not heard about the biopsies in 3 weeks.    SIGNATURES/CONFIDENTIALITY: You and/or your care partner have signed paperwork which will be entered into your electronic medical record.  These signatures attest to the fact that that the information above on your After Visit Summary has been reviewed and is understood.  Full responsibility of the confidentiality of this discharge information lies with you and/or your care-partner.

## 2021-02-04 NOTE — Op Note (Signed)
[place] Patient Name: Keilin Gamboa Procedure Date: 02/04/2021 1:13 PM MRN: [id number] Endoscopist: Gerrit Heck , MD Age: 50 Referring MD:  Date of Birth: 1971-04-03 Gender: Female Account #: [id number] Procedure:                Colonoscopy Indications:              Change in bowel habits, increased stool frequency,                            change in stool consistency, fecal urgency Medicines:                Monitored Anesthesia Care Procedure:                Pre-Anesthesia Assessment:                           - Prior to the procedure, a History and Physical                            was performed, and patient medications and                            allergies were reviewed. The patient's tolerance of                            previous anesthesia was also reviewed. The risks                            and benefits of the procedure and the sedation                            options and risks were discussed with the patient.                            All questions were answered, and informed consent                            was obtained. Prior Anticoagulants: The patient has                            taken no previous anticoagulant or antiplatelet                            agents. ASA Grade Assessment: III - A patient with                            severe systemic disease. After reviewing the risks                            and benefits, the patient was deemed in                            satisfactory condition to undergo the procedure.  After obtaining informed consent, the colonoscope                            was passed under direct vision. Throughout the                            procedure, the patient's blood pressure, pulse, and                            oxygen saturations were monitored continuously. The                            Olympus CF-HQ190 [id number]) Colonoscope was                            introduced  through the anus and advanced to the the                            cecum, identified by appendiceal orifice and                            ileocecal valve. The colonoscopy was performed                            without difficulty. The patient tolerated the                            procedure well. The quality of the bowel                            preparation was good. The ileocecal valve,                            appendiceal orifice, and rectum were photographed. Scope In: 1:19:57 PM Scope Out: 1:41:45 PM Scope Withdrawal Time: 0 hours 15 minutes 31 seconds  Total Procedure Duration: 0 hours 21 minutes 48 seconds  Findings:                 The perianal and digital rectal examinations were                            normal.                           Three sessile polyps were found in the sigmoid                            colon (1) and cecum (2). The polyps were 3 to 5 mm                            in size. These polyps were removed with a cold                            snare. Resection and retrieval were complete.  Estimated blood loss was minimal.                           Two sessile polyps were found in the rectum. The                            polyps were 2 to 3 mm in size. These polyps were                            removed with a cold biopsy forceps. Resection and                            retrieval were complete. Estimated blood loss was                            minimal.                           The mucosa was otherwise normal appearing                            throughout the colon. Biopsies for histology were                            taken with a cold forceps from the right colon and                            left colon for evaluation of microscopic colitis.                            Estimated blood loss was minimal.                           Anal papilla(e) were hypertrophied. Complications:            No immediate  complications. Estimated Blood Loss:     Estimated blood loss was minimal. Impression:               - Three 3 to 5 mm polyps in the sigmoid colon and                            in the cecum, removed with a cold snare. Resected                            and retrieved.                           - Two 2 to 3 mm polyps in the rectum, removed with                            a cold biopsy forceps. Resected and retrieved.                           - Normal mucosa in the entire examined colon.  Biopsied.                           - Anal papilla(e) were hypertrophied. Recommendation:           - Patient has a contact number available for                            emergencies. The signs and symptoms of potential                            delayed complications were discussed with the                            patient. Return to normal activities tomorrow.                            Written discharge instructions were provided to the                            patient.                           - Resume previous diet.                           - Continue present medications.                           - Await pathology results.                           - Repeat colonoscopy for surveillance based on                            pathology results.                           - Return to GI office PRN.                           - Use fiber, for example Citrucel, Fibercon, Konsyl                            or Metamucil. Gerrit Heck, MD 02/04/2021 1:47:35 PM

## 2021-02-04 NOTE — Progress Notes (Signed)
Called to room to assist during endoscopic procedure.  Patient ID and intended procedure confirmed with present staff. Received instructions for my participation in the procedure from the performing physician.  

## 2021-02-04 NOTE — Progress Notes (Signed)
C.W. vital signs.

## 2021-02-04 NOTE — Progress Notes (Signed)
A/ox3, pleased with MAC, report to RN 

## 2021-02-08 ENCOUNTER — Telehealth: Payer: Self-pay | Admitting: *Deleted

## 2021-02-08 NOTE — Telephone Encounter (Signed)
  Follow up Call-  Call back number 02/04/2021  Post procedure Call Back phone  # [phone]  Permission to leave phone message Yes  Some recent data might be hidden     Patient questions:  Do you have a fever, pain , or abdominal swelling? No. Pain Score  0 *  Have you tolerated food without any problems? Yes.    Have you been able to return to your normal activities? Yes.    Do you have any questions about your discharge instructions: Diet   No. Medications  No. Follow up visit  No.  Do you have questions or concerns about your Care? No.  Actions: * If pain score is 4 or above: No action needed, pain <4.  1. Have you developed a fever since your procedure? no  2.   Have you had an respiratory symptoms (SOB or cough) since your procedure? no  3.   Have you tested positive for COVID 19 since your procedure no  4.   Have you had any family members/close contacts diagnosed with the COVID 19 since your procedure?  no   If yes to any of these questions please route to Joylene John, RN and Joella Prince, RN

## 2021-02-17 ENCOUNTER — Other Ambulatory Visit: Payer: Self-pay | Admitting: Sports Medicine

## 2021-02-17 ENCOUNTER — Ambulatory Visit (INDEPENDENT_AMBULATORY_CARE_PROVIDER_SITE_OTHER): Payer: 59 | Admitting: Professional

## 2021-02-17 DIAGNOSIS — F4323 Adjustment disorder with mixed anxiety and depressed mood: Secondary | ICD-10-CM | POA: Diagnosis not present

## 2021-02-17 DIAGNOSIS — F321 Major depressive disorder, single episode, moderate: Secondary | ICD-10-CM

## 2021-02-18 ENCOUNTER — Encounter: Payer: Self-pay | Admitting: Gastroenterology

## 2021-03-01 ENCOUNTER — Ambulatory Visit: Payer: 59 | Admitting: Professional

## 2021-03-04 ENCOUNTER — Ambulatory Visit: Payer: Managed Care, Other (non HMO) | Admitting: Sports Medicine

## 2021-03-09 ENCOUNTER — Other Ambulatory Visit: Payer: Self-pay

## 2021-03-09 ENCOUNTER — Ambulatory Visit (INDEPENDENT_AMBULATORY_CARE_PROVIDER_SITE_OTHER): Payer: Managed Care, Other (non HMO) | Admitting: Sports Medicine

## 2021-03-09 ENCOUNTER — Ambulatory Visit (INDEPENDENT_AMBULATORY_CARE_PROVIDER_SITE_OTHER): Payer: 59 | Admitting: Professional

## 2021-03-09 ENCOUNTER — Encounter: Payer: Self-pay | Admitting: Sports Medicine

## 2021-03-09 DIAGNOSIS — R590 Localized enlarged lymph nodes: Secondary | ICD-10-CM

## 2021-03-09 DIAGNOSIS — M5136 Other intervertebral disc degeneration, lumbar region: Secondary | ICD-10-CM | POA: Diagnosis not present

## 2021-03-09 DIAGNOSIS — J4540 Moderate persistent asthma, uncomplicated: Secondary | ICD-10-CM

## 2021-03-09 DIAGNOSIS — F4323 Adjustment disorder with mixed anxiety and depressed mood: Secondary | ICD-10-CM

## 2021-03-09 DIAGNOSIS — F321 Major depressive disorder, single episode, moderate: Secondary | ICD-10-CM

## 2021-03-09 DIAGNOSIS — M51369 Other intervertebral disc degeneration, lumbar region without mention of lumbar back pain or lower extremity pain: Secondary | ICD-10-CM

## 2021-03-09 MED ORDER — FLUTICASONE FUROATE-VILANTEROL 200-25 MCG/INH IN AEPB: 1.0000 | INHALATION_SPRAY | Freq: Every day | RESPIRATORY_TRACT | 11 refills | Status: DC

## 2021-03-09 NOTE — Assessment & Plan Note (Signed)
Low back pain ultimately caused by disc desiccation from L4-S1 with a broad-based L5-S1 disc herniation, epidural lipomatosis we proceeded with a right L5-S1 interlaminar epidural, she returns today almost completely pain-free and very happy with results.

## 2021-03-09 NOTE — Assessment & Plan Note (Signed)
Fantastic improvements with the increase to 60 mg of Cymbalta, continue current dosage, no changes.

## 2021-03-09 NOTE — Assessment & Plan Note (Addendum)
Incidentally noted copious retroperitoneal lymphadenopathy, ultimately we got a CT of the abdomen and pelvis with IV contrast approved, she has not yet gotten this done, this is to evaluate for any pelvic neoplasia.  Contrast CT did show multiple pelvic lymphadenopathy, I would like a second opinion from heme-onc. Lymphoproliferative disease is in the differential here.

## 2021-03-09 NOTE — Progress Notes (Addendum)
    Procedures performed today:    None.  Independent interpretation of notes and tests performed by another provider:   None.  Brief History, Exam, Impression, and Recommendations:    Depression, major, single episode, moderate (HCC) Fantastic improvements with the increase to 60 mg of Cymbalta, continue current dosage, no changes.  Lumbar degenerative disc disease Low back pain ultimately caused by disc desiccation from L4-S1 with a broad-based L5-S1 disc herniation, epidural lipomatosis we proceeded with a right L5-S1 interlaminar epidural, she returns today almost completely pain-free and very happy with results.   Retroperitoneal lymphadenopathy Incidentally noted copious retroperitoneal lymphadenopathy, ultimately we got a CT of the abdomen and pelvis with IV contrast approved, she has not yet gotten this done, this is to evaluate for any pelvic neoplasia.  Contrast CT did show multiple pelvic lymphadenopathy, I would like a second opinion from heme-onc. Lymphoproliferative disease is in the differential here.  Persistent asthma without complication A little better with Julian Reil has improved her symptoms to some degree as well, increasing Breo dose for now.    ___________________________________________ Gwen Her. [name], M.D., ABFM., CAQSM. Primary Care and [place] Instructor of [employer] [facility] [employer]

## 2021-03-09 NOTE — Assessment & Plan Note (Signed)
A little better with Julian Reil has improved her symptoms to some degree as well, increasing Breo dose for now.

## 2021-03-17 ENCOUNTER — Ambulatory Visit (INDEPENDENT_AMBULATORY_CARE_PROVIDER_SITE_OTHER): Payer: Managed Care, Other (non HMO)

## 2021-03-17 ENCOUNTER — Other Ambulatory Visit: Payer: Self-pay

## 2021-03-17 DIAGNOSIS — R590 Localized enlarged lymph nodes: Secondary | ICD-10-CM | POA: Diagnosis not present

## 2021-03-17 LAB — I-STAT CREATININE (MANUAL ENTRY): Creatinine, Ser: 0.9 (ref 0.50–1.10)

## 2021-03-17 MED ORDER — IOHEXOL 300 MG/ML  SOLN
100.0000 mL | Freq: Once | INTRAMUSCULAR | Status: AC | PRN
  Administered 2021-03-17: 100 mL via INTRAVENOUS

## 2021-03-19 ENCOUNTER — Encounter: Payer: Self-pay | Admitting: *Deleted

## 2021-03-19 NOTE — Addendum Note (Signed)
Addended by: Silverio Decamp on: 03/19/2021 12:06 PM   Modules accepted: Orders

## 2021-03-19 NOTE — Progress Notes (Signed)
Reached out to [name] to introduce myself as the office RN Navigator and explain our new patient process. Reviewed the reason for their referral and scheduled their new patient appointment along with labs. Provided address and directions to the office including call back phone number. Reviewed with patient any concerns they may have or any possible barriers to attending their appointment.   Informed patient about my role as a navigator and that I will meet with them prior to their New Patient appointment and more fully discuss what services I can provide. At this time patient has no further questions or needs.  Oncology Nurse Navigator Documentation  Oncology Nurse Navigator Flowsheets 03/19/2021  Abnormal Finding Date 03/17/2021  Diagnosis Status Additional Work Up  Navigator Follow Up Date: 03/29/2021  Navigator Follow Up Reason: New Patient Appointment  Navigator Location [facility]  Navigator Encounter Type Introductory Phone Call;MyChart  Patient Visit Type MedOnc  Treatment Phase Abnormal Scans  Barriers/Navigation Needs Coordination of Care;Education  Education Other  Interventions Coordination of Care;Education  Acuity Level 2-Minimal Needs (1-2 Barriers Identified)  Coordination of Care Appts  Education Method Verbal;Written  Time Spent with Patient 58

## 2021-03-22 NOTE — Telephone Encounter (Signed)
Patient has been scheduled for Dr T on Wednesday . AM

## 2021-03-23 ENCOUNTER — Ambulatory Visit (INDEPENDENT_AMBULATORY_CARE_PROVIDER_SITE_OTHER): Payer: 59 | Admitting: Professional

## 2021-03-23 DIAGNOSIS — F4323 Adjustment disorder with mixed anxiety and depressed mood: Secondary | ICD-10-CM

## 2021-03-24 ENCOUNTER — Other Ambulatory Visit: Payer: Self-pay

## 2021-03-24 ENCOUNTER — Ambulatory Visit (INDEPENDENT_AMBULATORY_CARE_PROVIDER_SITE_OTHER): Payer: Managed Care, Other (non HMO) | Admitting: Sports Medicine

## 2021-03-24 DIAGNOSIS — R590 Localized enlarged lymph nodes: Secondary | ICD-10-CM

## 2021-03-24 NOTE — Assessment & Plan Note (Signed)
As before there was incidentally noted copious retroperitoneal lymphadenopathy, CT scan with contrast confirmed, she does have an appointment with heme-onc coming up on Monday. I did fill out her FMLA paperwork today. Return as needed.

## 2021-03-24 NOTE — Progress Notes (Signed)
    Procedures performed today:    None.  Independent interpretation of notes and tests performed by another provider:   None.  Brief History, Exam, Impression, and Recommendations:    Retroperitoneal lymphadenopathy As before there was incidentally noted copious retroperitoneal lymphadenopathy, CT scan with contrast confirmed, she does have an appointment with heme-onc coming up on Monday. I did fill out her FMLA paperwork today. Return as needed.    ___________________________________________ Gwen Her. [name], M.D., ABFM., CAQSM. Primary Care and [place] Instructor of [employer] [facility] [employer]

## 2021-03-29 ENCOUNTER — Other Ambulatory Visit: Payer: Self-pay | Admitting: Sports Medicine

## 2021-03-29 ENCOUNTER — Inpatient Hospital Stay (HOSPITAL_BASED_OUTPATIENT_CLINIC_OR_DEPARTMENT_OTHER): Payer: Managed Care, Other (non HMO) | Admitting: Hematology & Oncology

## 2021-03-29 ENCOUNTER — Encounter: Payer: Self-pay | Admitting: Hematology & Oncology

## 2021-03-29 ENCOUNTER — Encounter: Payer: Self-pay | Admitting: *Deleted

## 2021-03-29 ENCOUNTER — Inpatient Hospital Stay: Payer: Managed Care, Other (non HMO) | Attending: Hematology & Oncology

## 2021-03-29 ENCOUNTER — Other Ambulatory Visit: Payer: Self-pay

## 2021-03-29 ENCOUNTER — Telehealth: Payer: Self-pay

## 2021-03-29 VITALS — BP 131/96 | HR 88 | Temp 98.6°F | Resp 18 | Wt 265.1 lb

## 2021-03-29 DIAGNOSIS — R590 Localized enlarged lymph nodes: Secondary | ICD-10-CM | POA: Diagnosis not present

## 2021-03-29 DIAGNOSIS — M5136 Other intervertebral disc degeneration, lumbar region: Secondary | ICD-10-CM | POA: Insufficient documentation

## 2021-03-29 DIAGNOSIS — Z79899 Other long term (current) drug therapy: Secondary | ICD-10-CM | POA: Diagnosis not present

## 2021-03-29 DIAGNOSIS — Z9049 Acquired absence of other specified parts of digestive tract: Secondary | ICD-10-CM | POA: Diagnosis not present

## 2021-03-29 DIAGNOSIS — Z8719 Personal history of other diseases of the digestive system: Secondary | ICD-10-CM | POA: Insufficient documentation

## 2021-03-29 DIAGNOSIS — Z885 Allergy status to narcotic agent status: Secondary | ICD-10-CM | POA: Insufficient documentation

## 2021-03-29 DIAGNOSIS — I10 Essential (primary) hypertension: Secondary | ICD-10-CM | POA: Insufficient documentation

## 2021-03-29 DIAGNOSIS — M549 Dorsalgia, unspecified: Secondary | ICD-10-CM | POA: Diagnosis not present

## 2021-03-29 DIAGNOSIS — D573 Sickle-cell trait: Secondary | ICD-10-CM

## 2021-03-29 DIAGNOSIS — Z8249 Family history of ischemic heart disease and other diseases of the circulatory system: Secondary | ICD-10-CM

## 2021-03-29 DIAGNOSIS — E282 Polycystic ovarian syndrome: Secondary | ICD-10-CM

## 2021-03-29 DIAGNOSIS — G43809 Other migraine, not intractable, without status migrainosus: Secondary | ICD-10-CM

## 2021-03-29 DIAGNOSIS — Z836 Family history of other diseases of the respiratory system: Secondary | ICD-10-CM | POA: Diagnosis not present

## 2021-03-29 LAB — SAMPLE TO BLOOD BANK

## 2021-03-29 LAB — CMP (CANCER CENTER ONLY)
ALT: 56 U/L — ABNORMAL HIGH (ref 0–44)
AST: 60 U/L — ABNORMAL HIGH (ref 15–41)
Albumin: 3.9 g/dL (ref 3.5–5.0)
Alkaline Phosphatase: 121 U/L (ref 38–126)
Anion gap: 9 (ref 5–15)
BUN: 9 mg/dL (ref 6–20)
CO2: 29 mmol/L (ref 22–32)
Calcium: 9.5 mg/dL (ref 8.9–10.3)
Chloride: 100 mmol/L (ref 98–111)
Creatinine: 0.83 mg/dL (ref 0.44–1.00)
GFR, Estimated: >60 mL/min (ref >60)
Glucose, Bld: 99 mg/dL (ref 70–99)
Potassium: 3.3 mmol/L — ABNORMAL LOW (ref 3.5–5.1)
Sodium: 138 mmol/L (ref 135–145)
Total Bilirubin: 1.2 mg/dL (ref 0.3–1.2)
Total Protein: 6.9 g/dL (ref 6.5–8.1)

## 2021-03-29 LAB — CBC WITH DIFFERENTIAL (CANCER CENTER ONLY)
Abs Immature Granulocytes: 0.03 10*3/uL (ref 0.00–0.07)
Basophils Absolute: 0 10*3/uL (ref 0.0–0.1)
Basophils Relative: 1 %
Eosinophils Absolute: 0.2 10*3/uL (ref 0.0–0.5)
Eosinophils Relative: 3 %
HCT: 41.1 % (ref 36.0–46.0)
Hemoglobin: 14.5 g/dL (ref 12.0–15.0)
Immature Granulocytes: 1 %
Lymphocytes Relative: 24 %
Lymphs Abs: 1.3 10*3/uL (ref 0.7–4.0)
MCH: 31 pg (ref 26.0–34.0)
MCHC: 35.3 g/dL (ref 30.0–36.0)
MCV: 87.8 fL (ref 80.0–100.0)
Monocytes Absolute: 0.5 10*3/uL (ref 0.1–1.0)
Monocytes Relative: 9 %
Neutro Abs: 3.4 10*3/uL (ref 1.7–7.7)
Neutrophils Relative %: 62 %
Platelet Count: 248 10*3/uL (ref 150–400)
RBC: 4.68 MIL/uL (ref 3.87–5.11)
RDW: 14.6 % (ref 11.5–15.5)
WBC Count: 5.4 10*3/uL (ref 4.0–10.5)
nRBC: 0 % (ref 0.0–0.2)

## 2021-03-29 LAB — LACTATE DEHYDROGENASE: LDH: 211 U/L — ABNORMAL HIGH (ref 98–192)

## 2021-03-29 NOTE — Telephone Encounter (Signed)
Appts made per 03/29/21 los   Heather Salazar

## 2021-03-29 NOTE — Progress Notes (Signed)
Referral MD  Reason for Referral: Retroperitoneal lymphadenopathy-chronic  Chief Complaint  Patient presents with   New Patient (Initial Visit)  : I have a lot of back pain.  HPI: Heather Salazar is a very charming 50 year old African-American female.  She comes in with her daughter.  She has multiple medical issues.  She recently did have a colonoscopy.  Some polyps were found.  They are not malignant.  Back in March, she is complaining of back pain.  She does have chronic back discomfort.  She gained quite a bit of weight because of an antidepressant that she has been on.  She had an MRI done.  This did show some degenerative disc disease at L4-5 and L5-S1.  There is no neural impingement.  She did have some increased number and size of retroperitoneal lymph nodes.  These were all questionable clinical significance.  Going back through her scans, she had a CT scan done back in May 2017.  This also showed some retroperitoneal lymph nodes although no sizes were given.  She did have a CT scan done recently.  This was on 03/17/2021.  There was some nodal enlargement.  I think the largest lymph node measured 16 mm.  Because of this, she was kindly referred to the [facility] for an evaluation.  She does have sickle cell trait.  As far she knows, there is no history of cancer in the immediate family.  She does smoke.  Still been smoking for about 7 years.  She probably smokes half pack per day.  She is working for [employer].  She has been doing this for 21 years.    She already has a grandchild.  Truly, she is blessed.  She has had no rashes.  She is not noted any obvious swollen lymph nodes on her body.  She has had no change in bowel or bladder habits.  She has had no leg swelling.  Overall, I would say performance status is ECOG 1.     Past Medical History:  Diagnosis Date   Anxiety    Arthritis    back    Asthma    childhood   GERD  (gastroesophageal reflux disease)    History of colon polyps    Hypertension   :   Past Surgical History:  Procedure Laterality Date   CESAREAN SECTION     CHOLECYSTECTOMY N/A 03/06/2018   Procedure: LAPAROSCOPIC CHOLECYSTECTOMY ERAS PATHWAY;  Surgeon: Clovis Riley, MD;  Location: [facility];  Service: General;  Laterality: N/A;   COLONOSCOPY     In [place] [phone] did have a small polyp   tubaligation    :   Current Outpatient Medications:    acetaminophen (TYLENOL) 650 MG CR tablet, Take 1,300 mg by mouth daily with breakfast., Disp: , Rfl:    albuterol (VENTOLIN HFA) 108 (90 Base) MCG/ACT inhaler, Inhale 2 puffs into the lungs every 4 (four) hours as needed for wheezing or shortness of breath., Disp: 18 g, Rfl: 0   diclofenac (VOLTAREN) 75 MG EC tablet, Take 1 tablet (75 mg total) by mouth 2 (two) times daily., Disp: 60 tablet, Rfl: 3   DULoxetine (CYMBALTA) 60 MG capsule, TAKE 1 CAPSULE BY MOUTH EVERY DAY, Disp: 90 capsule, Rfl: 4   fexofenadine (ALLEGRA) 180 MG tablet, Take 1 tablet (180 mg total) by mouth daily., Disp: 90 tablet, Rfl: 3   fluticasone (FLONASE) 50 MCG/ACT nasal spray, PLACE 1-2 SPRAYS INTO BOTH  NOSTRILS DAILY., Disp: 48 mL, Rfl: 4   fluticasone furoate-vilanterol (BREO ELLIPTA) 200-25 MCG/INH AEPB, Inhale 1 puff into the lungs daily., Disp: 1 each, Rfl: 11   hyoscyamine (LEVSIN) 0.125 MG tablet, Take one by mouth every 4-6 hours as needed for abdominal cramping or pain or diarrhea., Disp: 15 tablet, Rfl: 0   LORazepam (ATIVAN) 0.5 MG tablet, Take 1 tablet (0.5 mg total) by mouth every 8 (eight) hours as needed for anxiety., Disp: 30 tablet, Rfl: 0   rizatriptan (MAXALT-MLT) 5 MG disintegrating tablet, Take 1 tablet (5 mg total) by mouth as needed for migraine. May repeat in 2 hours if needed, Disp: 10 tablet, Rfl: 3   topiramate (TOPAMAX) 50 MG tablet, Take 1 tablet (50 mg total) by mouth daily., Disp: 90 tablet, Rfl: 3   traMADol (ULTRAM)  50 MG tablet, Take 1-2 tablets (50-100 mg total) by mouth every 8 (eight) hours as needed for moderate pain. Maximum 6 tabs per day., Disp: 21 tablet, Rfl: 0   valsartan-hydrochlorothiazide (DIOVAN-HCT) 320-25 MG tablet, TAKE 1/2 TABLET BY MOUTH EVERY DAY, Disp: 45 tablet, Rfl: 0   zolpidem (AMBIEN) 5 MG tablet, Take 1 tablet (5 mg total) by mouth at bedtime as needed for sleep., Disp: 30 tablet, Rfl: 1   predniSONE (DELTASONE) 50 MG tablet, Pt to take 50 mg of prednisone on 02/01/21 at 11:30 PM, 50 mg of prednisone on 02/02/21 at 5:30 AM, and 50 mg of prednisone on 02/02/21 at 11:30 AM. Pt is also to take 50 mg of benadryl on 02/02/21 at 11:30 AM. Please call [phone] with any questions. (Patient not taking: Reported on 03/29/2021), Disp: 3 tablet, Rfl: 0:  :   Allergies  Allergen Reactions   Acetaminophen-Codeine Other (See Comments)    Shortness of breath. This was the codeine component, not tylenol.   Iodine Solution [Povidone Iodine] Rash   Oxycodone Itching  :   Family History  Problem Relation Age of Onset   Asthma Mother    Heart failure Father    Asthma Sister    Asthma Brother    Diabetes Neg Hx    Colon cancer Neg Hx    Esophageal cancer Neg Hx   :   Social History   Socioeconomic History   Marital status: Divorced    Spouse name: Not on file   Number of children: Not on file   Years of education: Not on file   Highest education level: Not on file  Occupational History   Not on file  Tobacco Use   Smoking status: Every Day    Packs/day: 0.50    Pack years: 0.00    Types: Cigarettes   Smokeless tobacco: Never  Vaping Use   Vaping Use: Never used  Substance and Sexual Activity   Alcohol use: Yes    Comment: weekends    Drug use: No   Sexual activity: Not on file  Other Topics Concern   Not on file  Social History Narrative   Not on file   Social Determinants of Health   Financial Resource Strain: Not on file  Food Insecurity: Not on file   Transportation Needs: Not on file  Physical Activity: Not on file  Stress: Not on file  Social Connections: Not on file  Intimate Partner Violence: Not on file  :  Review of Systems  Constitutional: Negative.   HENT: Negative.    Eyes: Negative.   Respiratory: Negative.    Cardiovascular: Negative.  Negative for palpitations.  Gastrointestinal: Negative.   Genitourinary: Negative.   Musculoskeletal:  Positive for back pain.  Skin: Negative.   Neurological: Negative.   Endo/Heme/Allergies: Negative.   Psychiatric/Behavioral: Negative.      Exam:  This is a well-developed well-nourished African-American female in no obvious distress.  Vital signs are temperature 98.6.  Pulse 88.  Blood pressure 131/96.  Weight is 265 pounds.  Head and neck exam shows no ocular or oral lesions.  There are no palpable cervical or supraclavicular lymph nodes.  Lungs are clear bilaterally.  Cardiac exam regular rate and rhythm with no murmurs, rubs or bruits.  Abdomen is soft.  She has good bowel sounds.  She is moderately obese.  There is no fluid wave.  There is no palpable liver or spleen tip.  Back exam shows no tenderness over the spine, ribs or hips.  Extremity shows no clubbing, cyanosis or edema.  She has good range of motion of her joints.  Neurological exam shows no focal neurological deficits.  Skin exam shows no rashes, ecchymosis or petechia.  @IPVITALS @    Recent Labs    03/29/21 1101  WBC 5.4  HGB 14.5  HCT 41.1  PLT 248    Recent Labs    03/29/21 1101  NA 138  K 3.3*  CL 100  CO2 29  GLUCOSE 99  BUN 9  CREATININE 0.83  CALCIUM 9.5    Blood smear review: None  Pathology: None    Assessment and Plan: Heather Salazar is a very nice 51 year old African-American female.  She has mild retroperitoneal adenopathy.  This is totally none prescription.  There is no indication that there is a malignancy.  Again she has had these lymph nodes for for 5 years.  I went back to a CT  scan that was done back in 2017.  I think that we are going to have to do a PET scan on her.  We will see if these lymph nodes are active.  If they are metabolically active, we will have to see what the activity level is.  I will think that if we are going to have to do a biopsy, the only way we had to be an exploratory laparoscopy.  I would be doubtful that these lymph nodes will be causing her back discomfort.  Again I cannot find anything on her physical exam that would suggest a malignancy.  I spent a good hour with that she and her daughter.  They are both very very nice.  I reviewed her lab work with her.  I reviewed her CT scans with her.  I tried to reassure her that I just do not think that we had a problem with these lymph nodes.  I would like to have the PET scan done in a couple weeks.  I would then see her back in about 6 weeks or sooner if needed.

## 2021-03-29 NOTE — Progress Notes (Signed)
Initial RN Navigator Patient Visit  Name: Heather Salazar Date of Referral : 03/19/2021 Diagnosis: Retroperitoneal Lymphadenopathy  Met with patient prior to their visit with MD. [name] patient  MD and Navigator business card. Reviewed with patient the general overview of expected course after initial diagnosis and time frame for all steps to be completed.  Patient completed visit with Dr. Marin Olp and will need a PET scan.   PET scan scheduled for 04/05/21 at 10am. Educated patient to arrival time of 930a, NPO that morning and low carbs. All instructions and appointment details also sent to patient home.   Patient understands all follow up procedures and expectations. They have my number to reach out for any further clarification or additional needs.   Oncology Nurse Navigator Documentation  Oncology Nurse Navigator Flowsheets 03/29/2021  Abnormal Finding Date -  Diagnosis Status -  Navigator Follow Up Date: 04/05/2021  Navigator Follow Up Reason: Scan Review  Navigator Location [facility]  Navigator Encounter Type Initial MedOnc;Appt/Treatment Plan Review;Telephone  Telephone Appt Confirmation/Clarification;Education;Outgoing Call  Patient Visit Type MedOnc  Treatment Phase Abnormal Scans  Barriers/Navigation Needs Coordination of Care;Education  Education Other  Interventions Coordination of Care;Education;Psycho-Social Support  Acuity Level 2-Minimal Needs (1-2 Barriers Identified)  Coordination of Care Appts;Radiology  Education Method Verbal;Written  Support Groups/Services Friends and Family  Time Spent with Patient 108

## 2021-03-30 LAB — IGG, IGA, IGM
IgA: 235 mg/dL (ref 87–352)
IgG (Immunoglobin G), Serum: 1125 mg/dL (ref 586–1602)
IgM (Immunoglobulin M), Srm: 158 mg/dL (ref 26–217)

## 2021-03-30 LAB — KAPPA/LAMBDA LIGHT CHAINS
Kappa free light chain: 28.2 mg/L — ABNORMAL HIGH (ref 3.3–19.4)
Kappa, lambda light chain ratio: 1.19 (ref 0.26–1.65)
Lambda free light chains: 23.7 mg/L (ref 5.7–26.3)

## 2021-03-30 LAB — BETA 2 MICROGLOBULIN, SERUM: Beta-2 Microglobulin: 2.2 mg/L (ref 0.6–2.4)

## 2021-03-31 LAB — PROTEIN ELECTROPHORESIS, SERUM, WITH REFLEX
A/G Ratio: 1.1 (ref 0.7–1.7)
Albumin ELP: 3.5 g/dL (ref 2.9–4.4)
Alpha-1-Globulin: 0.2 g/dL (ref 0.0–0.4)
Alpha-2-Globulin: 0.7 g/dL (ref 0.4–1.0)
Beta Globulin: 1.2 g/dL (ref 0.7–1.3)
Gamma Globulin: 1.1 g/dL (ref 0.4–1.8)
Globulin, Total: 3.2 g/dL (ref 2.2–3.9)
Total Protein ELP: 6.7 g/dL (ref 6.0–8.5)

## 2021-04-01 ENCOUNTER — Telehealth: Payer: Self-pay | Admitting: *Deleted

## 2021-04-01 NOTE — Telephone Encounter (Signed)
-----   Message from Volanda Napoleon, MD sent at 03/31/2021  6:03 PM EDT ----- Call - the labs are all normal!!!  I just do not see a problem with your blood!!  Heather Salazar

## 2021-04-05 ENCOUNTER — Ambulatory Visit (HOSPITAL_COMMUNITY): Payer: Managed Care, Other (non HMO)

## 2021-04-06 ENCOUNTER — Encounter: Payer: Self-pay | Admitting: *Deleted

## 2021-04-06 NOTE — Progress Notes (Signed)
Patient's PET scan had to be rescheduled due to insurance pending approval. Now scheduled for 04/20/21.  Oncology Nurse Navigator Documentation  Oncology Nurse Navigator Flowsheets 04/06/2021  Abnormal Finding Date -  Diagnosis Status -  Navigator Follow Up Date: 04/20/2021  Navigator Follow Up Reason: Scan Review  Navigator Location [facility]  Navigator Encounter Type Appt/Treatment Plan Review;Scan Review  Telephone -  Patient Visit Type MedOnc  Treatment Phase Abnormal Scans  Barriers/Navigation Needs Coordination of Care;Education  Education -  Interventions None Required  Acuity Level 2-Minimal Needs (1-2 Barriers Identified)  Coordination of Care -  Education Method -  Support Groups/Services Friends and Family  Time Spent with Patient 15

## 2021-04-07 ENCOUNTER — Ambulatory Visit (INDEPENDENT_AMBULATORY_CARE_PROVIDER_SITE_OTHER): Payer: 59 | Admitting: Professional

## 2021-04-07 DIAGNOSIS — F4323 Adjustment disorder with mixed anxiety and depressed mood: Secondary | ICD-10-CM | POA: Diagnosis not present

## 2021-04-11 ENCOUNTER — Emergency Department
Admission: EM | Admit: 2021-04-11 | Discharge: 2021-04-11 | Disposition: A | Discharge status: Home / Self Care | Payer: Managed Care, Other (non HMO) | Source: Home / Self Care

## 2021-04-11 ENCOUNTER — Other Ambulatory Visit: Payer: Self-pay

## 2021-04-11 ENCOUNTER — Encounter: Payer: Self-pay | Admitting: Emergency Medicine

## 2021-04-11 DIAGNOSIS — U071 COVID-19: Secondary | ICD-10-CM | POA: Diagnosis not present

## 2021-04-11 DIAGNOSIS — R059 Cough, unspecified: Secondary | ICD-10-CM

## 2021-04-11 DIAGNOSIS — B9689 Other specified bacterial agents as the cause of diseases classified elsewhere: Secondary | ICD-10-CM

## 2021-04-11 DIAGNOSIS — J019 Acute sinusitis, unspecified: Secondary | ICD-10-CM | POA: Diagnosis not present

## 2021-04-11 LAB — POC SARS CORONAVIRUS 2 AG -  ED: SARS Coronavirus 2 Ag: POSITIVE — AB

## 2021-04-11 MED ORDER — AMOXICILLIN-POT CLAVULANATE 875-125 MG PO TABS: 1.0000 | ORAL_TABLET | Freq: Two times a day (BID) | ORAL | 0 refills | Status: AC

## 2021-04-11 MED ORDER — METHYLPREDNISOLONE 4 MG PO TBPK: ORAL_TABLET | ORAL | 0 refills | Status: DC

## 2021-04-11 MED ORDER — BENZONATATE 200 MG PO CAPS: 200.0000 mg | ORAL_CAPSULE | Freq: Three times a day (TID) | ORAL | 0 refills | Status: AC | PRN

## 2021-04-11 MED ORDER — NIRMATRELVIR/RITONAVIR (PAXLOVID)TABLET: 3.0000 | ORAL_TABLET | Freq: Two times a day (BID) | ORAL | 0 refills | Status: AC

## 2021-04-11 NOTE — ED Triage Notes (Addendum)
Patient here day 5 of  cough, extreme fatigue, fever yesterday 102; took covid home test 3 days ago and it was negative. States has not felt this bad since she had influenza couple of years ago. Has had first covid vaccinations but not the boosters. Has taken alka-seltzer cold/flu OTC without relief of symptoms. Her grandchildren have recently been ill.

## 2021-04-11 NOTE — ED Provider Notes (Signed)
[name]    CSN: [id number] Arrival date & time: 04/11/21  1416      History   Chief Complaint Chief Complaint  Patient presents with   Cough   Fatigue    Fever     HPI Heather Salazar is a 50 y.o. female.   HPI Very pleasant 50 year old female presents with fatigue, fever, sinus nasal congestion, sinus pressure, sore throat cough for 5 days reports fever of 102.0 yesterday with worsening fatigue.  Reports negative recent home COVID-19 test days ago.  Patient is vaccinated for COVID-19.  Past Medical History:  Diagnosis Date   Anxiety    Arthritis    back    Asthma    childhood   GERD (gastroesophageal reflux disease)    History of colon polyps    Hypertension     Patient Active Problem List   Diagnosis Date Noted   Retroperitoneal lymphadenopathy 12/21/2020   Stool incontinence 12/10/2020   Polyarthralgia 12/10/2020   Depression, major, single episode, moderate ([place]) 11/12/2020   Lumbar degenerative disc disease 11/12/2020   Persistent asthma without complication 73/71/0626   Smoker 11/29/2019   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   Insomnia 03/05/2019   COVID-19 12/31/2018   Transaminitis 09/14/2018   Gastro-esophageal reflux 09/13/2018   Benign essential hypertension 94/85/4627   Biliary colic 03/50/0938   Annual physical exam 01/16/2015   Polycystic ovarian syndrome 01/16/2015   Obesity 01/16/2015    Past Surgical History:  Procedure Laterality Date   CESAREAN SECTION     CHOLECYSTECTOMY N/A 03/06/2018   Procedure: LAPAROSCOPIC CHOLECYSTECTOMY ERAS PATHWAY;  Surgeon: Clovis Riley, MD;  Location: [facility];  Service: General;  Laterality: N/A;   COLONOSCOPY     In [place] [phone] did have a small polyp   tubaligation      OB History   No obstetric history on file.      Home Medications    Prior to Admission medications   Medication Sig Start Date End Date Taking? Authorizing Provider   amoxicillin-clavulanate (AUGMENTIN) 875-125 MG tablet Take 1 tablet by mouth 2 (two) times daily for 5 days. 04/11/21 04/16/21 Yes Eliezer Lofts, FNP  benzonatate (TESSALON) 200 MG capsule Take 1 capsule (200 mg total) by mouth 3 (three) times daily as needed for up to 7 days for cough. 04/11/21 04/18/21 Yes Eliezer Lofts, FNP  methylPREDNISolone (MEDROL DOSEPAK) 4 MG TBPK tablet Take as directed. 04/11/21  Yes Eliezer Lofts, FNP  nirmatrelvir/ritonavir EUA (PAXLOVID) TABS Take 3 tablets by mouth 2 (two) times daily for 5 days. Patient GFR is 60. Take nirmatrelvir (150 mg) two tablets twice daily for 5 days and ritonavir (100 mg) one tablet twice daily for 5 days. 04/11/21 04/16/21 Yes Eliezer Lofts, FNP  acetaminophen (TYLENOL) 650 MG CR tablet Take 1,300 mg by mouth daily with breakfast.    [provider]  albuterol (VENTOLIN HFA) 108 (90 Base) MCG/ACT inhaler Inhale 2 puffs into the lungs every 4 (four) hours as needed for wheezing or shortness of breath. 09/24/19   Kandra Nicolas, MD  diclofenac (VOLTAREN) 75 MG EC tablet Take 1 tablet (75 mg total) by mouth 2 (two) times daily. 12/15/20 12/15/21  Silverio Decamp, MD  DULoxetine (CYMBALTA) 60 MG capsule TAKE 1 CAPSULE BY MOUTH EVERY DAY 02/17/21   Silverio Decamp, MD  fexofenadine (ALLEGRA) 180 MG tablet Take 1 tablet (180 mg total) by mouth daily. 01/13/21   Silverio Decamp, MD  fluticasone (FLONASE) 50 MCG/ACT  nasal spray PLACE 1-2 SPRAYS INTO BOTH NOSTRILS DAILY. 03/16/20   Silverio Decamp, MD  fluticasone furoate-vilanterol (BREO ELLIPTA) 200-25 MCG/INH AEPB Inhale 1 puff into the lungs daily. 03/09/21   Silverio Decamp, MD  hyoscyamine (LEVSIN) 0.125 MG tablet Take one by mouth every 4-6 hours as needed for abdominal cramping or pain or diarrhea. 05/27/20   Jacqulyn Cane, MD  LORazepam (ATIVAN) 0.5 MG tablet Take 1 tablet (0.5 mg total) by mouth every 8 (eight) hours as needed for anxiety. 01/21/21   Silverio Decamp, MD  predniSONE (DELTASONE) 50 MG tablet Pt to take 50 mg of prednisone on 02/01/21 at 11:30 PM, 50 mg of prednisone on 02/02/21 at 5:30 AM, and 50 mg of prednisone on 02/02/21 at 11:30 AM. Pt is also to take 50 mg of benadryl on 02/02/21 at 11:30 AM. Please call [phone] with any questions. Patient not taking: No sig reported 01/29/21   Titus Dubin, MD  rizatriptan (MAXALT-MLT) 5 MG disintegrating tablet Take 1 tablet (5 mg total) by mouth as needed for migraine. May repeat in 2 hours if needed 10/01/19   Silverio Decamp, MD  topiramate (TOPAMAX) 50 MG tablet Take 1 tablet (50 mg total) by mouth daily. 03/29/21   Silverio Decamp, MD  traMADol (ULTRAM) 50 MG tablet Take 1-2 tablets (50-100 mg total) by mouth every 8 (eight) hours as needed for moderate pain. Maximum 6 tabs per day. 12/15/20   Silverio Decamp, MD  valsartan-hydrochlorothiazide (DIOVAN-HCT) 320-25 MG tablet TAKE 1/2 TABLET BY MOUTH EVERY DAY 01/25/21   Silverio Decamp, MD  zolpidem (AMBIEN) 5 MG tablet Take 1 tablet (5 mg total) by mouth at bedtime as needed for sleep. 11/12/20   Silverio Decamp, MD  pantoprazole (PROTONIX) 40 MG tablet Take 1 tablet (40 mg total) by mouth daily. 09/13/18 05/27/20  Silverio Decamp, MD    Family History Family History  Problem Relation Age of Onset   Asthma Mother    Heart failure Father    Asthma Sister    Asthma Brother    Diabetes Neg Hx    Colon cancer Neg Hx    Esophageal cancer Neg Hx     Social History Social History   Tobacco Use   Smoking status: Every Day    Packs/day: 0.50    Pack years: 0.00    Types: Cigarettes   Smokeless tobacco: Never  Vaping Use   Vaping Use: Never used  Substance Use Topics   Alcohol use: Yes    Comment: weekends    Drug use: No     Allergies   Acetaminophen-codeine, Iodine solution [povidone iodine], and Oxycodone   Review of Systems Review of Systems  Constitutional:  Positive for fatigue  and fever.  HENT:  Positive for congestion, sinus pressure and sore throat.   Respiratory:  Positive for cough.   All other systems reviewed and are negative.   Physical Exam Triage Vital Signs ED Triage Vitals  Enc Vitals Group     BP 04/11/21 1548 121/80     Pulse Rate 04/11/21 1548 98     Resp 04/11/21 1548 18     Temp 04/11/21 1548 98 F (36.7 C)     Temp Source 04/11/21 1548 Oral     SpO2 04/11/21 1548 95 %     Weight 04/11/21 1549 260 lb (117.9 kg)     Height 04/11/21 1549 5' 5"  (1.651 m)     Head Circumference --  Peak Flow --      Pain Score 04/11/21 1549 0     Pain Loc --      Pain Edu? --      Excl. in [place]? --    No data found.  Updated Vital Signs BP 121/80 (BP Location: Left Arm)   Pulse 98   Temp 98 F (36.7 C) (Oral)   Resp 18   Ht 5' 5"  (1.651 m)   Wt 260 lb (117.9 kg)   LMP 09/06/2018 (Exact Date) Comment: patient signed preg test waiver  SpO2 95%   BMI 43.27 kg/m   Physical Exam Vitals and nursing note reviewed.  Constitutional:      General: She is not in acute distress.    Appearance: Normal appearance. She is obese. She is ill-appearing. She is not toxic-appearing or diaphoretic.  HENT:     Head: Normocephalic and atraumatic.     Right Ear: Tympanic membrane and external ear normal.     Left Ear: Tympanic membrane and external ear normal.     Ears:     Comments: Bilateral EACs moderately compressed    Nose: Nose normal.     Mouth/Throat:     Mouth: Mucous membranes are moist.     Pharynx: Oropharynx is clear.  Eyes:     Extraocular Movements: Extraocular movements intact.     Conjunctiva/sclera: Conjunctivae normal.     Pupils: Pupils are equal, round, and reactive to light.  Cardiovascular:     Rate and Rhythm: Normal rate and regular rhythm.     Pulses: Normal pulses.     Heart sounds: Normal heart sounds. No murmur heard. Pulmonary:     Effort: Pulmonary effort is normal. No respiratory distress.     Breath sounds: Normal  breath sounds. No stridor. No wheezing, rhonchi or rales.     Comments: Infrequent nonproductive cough noted on exam Musculoskeletal:        General: Normal range of motion.     Cervical back: Normal range of motion and neck supple. Tenderness present.  Lymphadenopathy:     Cervical: Cervical adenopathy present.  Skin:    General: Skin is warm and dry.  Neurological:     General: No focal deficit present.     Mental Status: She is alert and oriented to person, place, and time.  Psychiatric:        Mood and Affect: Mood normal.        Behavior: Behavior normal.     UC Treatments / Results  Labs (all labs ordered are listed, but only abnormal results are displayed) Labs Reviewed  POC SARS CORONAVIRUS 2 AG -  ED - Abnormal; Notable for the following components:      Result Value   SARS Coronavirus 2 Ag Positive (*)    All other components within normal limits  COVID-19, FLU A+B NAA    EKG   Radiology No results found.  Procedures Procedures (including critical care time)  Medications Ordered in UC Medications - No data to display  Initial Impression / Assessment and Plan / UC Course  I have reviewed the triage vital signs and the nursing notes.  Pertinent labs & imaging results that were available during my care of the patient were reviewed by me and considered in my medical decision making (see chart for details).     MDM: 1.  COVID-19-Rx'd Paxlovid, 2.  Cough-Rx'd Medrol Dosepak, and Tessalon Perles, 3.  Acute bacterial rhinosinusitis-Rx'd Augmentin and instructed not to  start this medication until 04/18/2021 only if afebrile and symptoms have not resolved. Final Clinical Impressions(s) / UC Diagnoses   Final diagnoses:  Cough  Acute bacterial rhinosinusitis  COVID-19     Discharge Instructions      Advised patient to Paxlovid now, may use Tessalon Perles now daily, as needed.  Advised patient not to start Medrol Dosepak until tomorrow morning, Monday,  04/12/2021.  Advised patient to hold Augmentin and not to start until 7 days from now or 04/18/2021  if sinus nasal congestion symptoms are still present and afebrile (temperature below 99.0) before doing so.  Encourage patient increase daily water intake while taking these medications and to self quarantine for the next 10 days or 04/21/2021.     ED Prescriptions     Medication Sig Dispense Auth. Provider   amoxicillin-clavulanate (AUGMENTIN) 875-125 MG tablet Take 1 tablet by mouth 2 (two) times daily for 5 days. 10 tablet Eliezer Lofts, FNP   methylPREDNISolone (MEDROL DOSEPAK) 4 MG TBPK tablet Take as directed. 1 each Eliezer Lofts, FNP   benzonatate (TESSALON) 200 MG capsule Take 1 capsule (200 mg total) by mouth 3 (three) times daily as needed for up to 7 days for cough. 30 capsule Eliezer Lofts, FNP   nirmatrelvir/ritonavir EUA (PAXLOVID) TABS Take 3 tablets by mouth 2 (two) times daily for 5 days. Patient GFR is 60. Take nirmatrelvir (150 mg) two tablets twice daily for 5 days and ritonavir (100 mg) one tablet twice daily for 5 days. 30 tablet Eliezer Lofts, FNP      PDMP not reviewed this encounter.   Eliezer Lofts, [place] 04/11/21 1737

## 2021-04-11 NOTE — Discharge Instructions (Addendum)
Advised patient to Paxlovid now, may use Tessalon Perles now daily, as needed.  Advised patient not to start Medrol Dosepak until tomorrow morning, Monday, 04/12/2021.  Advised patient to hold Augmentin and not to start until 7 days from now or 04/18/2021  if sinus nasal congestion symptoms are still present and afebrile (temperature below 99.0) before doing so.  Encourage patient increase daily water intake while taking these medications and to self quarantine for the next 10 days or 04/21/2021.

## 2021-04-12 LAB — COVID-19, FLU A+B NAA
Influenza A, NAA: NOT DETECTED
Influenza B, NAA: NOT DETECTED
SARS-CoV-2, NAA: DETECTED — AB

## 2021-04-20 ENCOUNTER — Ambulatory Visit: Payer: 59 | Admitting: Professional

## 2021-04-20 ENCOUNTER — Encounter: Payer: Self-pay | Admitting: *Deleted

## 2021-04-20 ENCOUNTER — Ambulatory Visit (HOSPITAL_COMMUNITY): Payer: Managed Care, Other (non HMO)

## 2021-04-20 NOTE — Progress Notes (Signed)
Patient was scheduled twice for PET scan, however after initial PA request and peer to peer we could not obtain authorization for scan. Patient is currently scheduled for follow up with this office on 05/12/2021.  Spoke with Dr Marin Olp to see if any additional scans or testing was needed, and at this time there are none. He will follow up with patient at the next appointment.   Called and reviewed this with patient. She is appreciative of the call and will follow up with our office in August.   Oncology Nurse Navigator Documentation  Oncology Nurse Navigator Flowsheets 04/20/2021  Abnormal Finding Date -  Diagnosis Status -  Navigator Follow Up Date: 05/12/2021  Navigator Follow Up Reason: Follow-up Appointment  Navigator Location [facility]  Navigator Encounter Type Telephone  Telephone Education;Outgoing Call  Patient Visit Type MedOnc  Treatment Phase Abnormal Scans  Barriers/Navigation Needs Coordination of Care;Education  Education -  Interventions Education;Psycho-Social Support  Acuity Level 2-Minimal Needs (1-2 Barriers Identified)  Coordination of Care -  Education Method Verbal  Support Groups/Services Friends and Family  Time Spent with Patient 15

## 2021-04-21 ENCOUNTER — Encounter: Payer: Self-pay | Admitting: Physician Assistant

## 2021-04-21 ENCOUNTER — Ambulatory Visit (HOSPITAL_BASED_OUTPATIENT_CLINIC_OR_DEPARTMENT_OTHER): Admission: RE | Admit: 2021-04-21 | Payer: Managed Care, Other (non HMO) | Source: Ambulatory Visit

## 2021-04-21 ENCOUNTER — Telehealth (INDEPENDENT_AMBULATORY_CARE_PROVIDER_SITE_OTHER): Payer: Managed Care, Other (non HMO) | Admitting: Physician Assistant

## 2021-04-21 VITALS — BP 100/62 | Temp 100.1°F | Ht 65.0 in | Wt 260.0 lb

## 2021-04-21 DIAGNOSIS — R059 Cough, unspecified: Secondary | ICD-10-CM | POA: Diagnosis not present

## 2021-04-21 DIAGNOSIS — M79662 Pain in left lower leg: Secondary | ICD-10-CM | POA: Diagnosis not present

## 2021-04-21 DIAGNOSIS — R5383 Other fatigue: Secondary | ICD-10-CM

## 2021-04-21 DIAGNOSIS — R0602 Shortness of breath: Secondary | ICD-10-CM

## 2021-04-21 DIAGNOSIS — U071 COVID-19: Secondary | ICD-10-CM | POA: Diagnosis not present

## 2021-04-21 MED ORDER — BENZONATATE 200 MG PO CAPS: 200.0000 mg | ORAL_CAPSULE | Freq: Two times a day (BID) | ORAL | 0 refills | Status: DC | PRN

## 2021-04-21 NOTE — Progress Notes (Signed)
10 days Covid + Having a lot of fatigue and cough Still taking tessalon pearls, have Augmentin but told to wait 10 days to start, so she wanted to see if she needs to start that
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
## 2021-04-22 ENCOUNTER — Other Ambulatory Visit: Payer: Self-pay | Admitting: Sports Medicine

## 2021-04-22 DIAGNOSIS — I10 Essential (primary) hypertension: Secondary | ICD-10-CM

## 2021-05-03 ENCOUNTER — Ambulatory Visit (HOSPITAL_BASED_OUTPATIENT_CLINIC_OR_DEPARTMENT_OTHER): Admission: RE | Admit: 2021-05-03 | Payer: Managed Care, Other (non HMO) | Source: Ambulatory Visit

## 2021-05-04 ENCOUNTER — Ambulatory Visit (INDEPENDENT_AMBULATORY_CARE_PROVIDER_SITE_OTHER): Payer: 59 | Admitting: Professional

## 2021-05-04 ENCOUNTER — Encounter: Payer: Self-pay | Admitting: Physician Assistant

## 2021-05-04 ENCOUNTER — Encounter (INDEPENDENT_AMBULATORY_CARE_PROVIDER_SITE_OTHER): Payer: Managed Care, Other (non HMO)

## 2021-05-04 DIAGNOSIS — F4323 Adjustment disorder with mixed anxiety and depressed mood: Secondary | ICD-10-CM

## 2021-05-04 DIAGNOSIS — F321 Major depressive disorder, single episode, moderate: Secondary | ICD-10-CM

## 2021-05-04 MED ORDER — LORAZEPAM 0.5 MG PO TABS: 0.5000 mg | ORAL_TABLET | Freq: Three times a day (TID) | ORAL | 0 refills | Status: DC | PRN

## 2021-05-04 NOTE — Telephone Encounter (Signed)
Spoke with patient. She is scheduled to come in tomorrow for post covid symptoms.

## 2021-05-05 ENCOUNTER — Telehealth (HOSPITAL_COMMUNITY): Payer: Self-pay | Admitting: Licensed Clinical Social Worker

## 2021-05-05 ENCOUNTER — Ambulatory Visit: Payer: Managed Care, Other (non HMO) | Admitting: Family Medicine

## 2021-05-05 ENCOUNTER — Encounter: Payer: Self-pay | Admitting: Family Medicine

## 2021-05-05 ENCOUNTER — Other Ambulatory Visit: Payer: Self-pay

## 2021-05-05 VITALS — BP 117/69 | HR 91 | Ht 65.0 in | Wt 264.0 lb

## 2021-05-05 DIAGNOSIS — U099 Post covid-19 condition, unspecified: Secondary | ICD-10-CM

## 2021-05-05 DIAGNOSIS — R5382 Chronic fatigue, unspecified: Secondary | ICD-10-CM

## 2021-05-05 DIAGNOSIS — G9332 Myalgic encephalomyelitis/chronic fatigue syndrome: Secondary | ICD-10-CM

## 2021-05-05 DIAGNOSIS — R053 Chronic cough: Secondary | ICD-10-CM

## 2021-05-05 MED ORDER — PREDNISONE 20 MG PO TABS: 40.0000 mg | ORAL_TABLET | Freq: Every day | ORAL | 0 refills | Status: DC

## 2021-05-05 MED ORDER — DOXYCYCLINE HYCLATE 100 MG PO TABS: 100.0000 mg | ORAL_TABLET | Freq: Two times a day (BID) | ORAL | 0 refills | Status: DC

## 2021-05-05 MED ORDER — ALBUTEROL SULFATE HFA 108 (90 BASE) MCG/ACT IN AERS: 2.0000 | INHALATION_SPRAY | RESPIRATORY_TRACT | 0 refills | Status: DC | PRN

## 2021-05-05 NOTE — Progress Notes (Signed)
Established Patient Office Visit  Subjective:  Patient ID: Heather Salazar, female    DOB: 1971-07-13  Age: 50 y.o. MRN: [id number]  CC:  Chief Complaint  Patient presents with   Cough    HPI Heather Salazar presents for cough.  She said her cough started at the beginning a month about 4 weeks ago she was diagnosed with COVID right around the 13th of the month.  She was given prednisone.  She has been using Breo daily as well as her rescue inhaler.  Though the rescue inhaler expired in November.  She says she just cannot quite kick the cough.  Its been worse at night to the point that its been keeping her awake and sometimes she will even choke or gag and vomit.  The cough is mostly dry.  There in the morning she is blowing out some green nasal discharge first thing in the morning but nothing during the day she denies any facial pain or pressure.  She has been having more frequent headaches that are different from her migraines but they seem to move around or not always in the same location.  She does have a history of asthma.  Past Medical History:  Diagnosis Date   Anxiety    Arthritis    back    Asthma    childhood   GERD (gastroesophageal reflux disease)    History of colon polyps    Hypertension     Past Surgical History:  Procedure Laterality Date   CESAREAN SECTION     CHOLECYSTECTOMY N/A 03/06/2018   Procedure: LAPAROSCOPIC CHOLECYSTECTOMY ERAS PATHWAY;  Surgeon: Clovis Riley, MD;  Location: [facility];  Service: General;  Laterality: N/A;   COLONOSCOPY     In [place] [phone] did have a small polyp   tubaligation      Family History  Problem Relation Age of Onset   Asthma Mother    Heart failure Father    Asthma Sister    Asthma Brother    Diabetes Neg Hx    Colon cancer Neg Hx    Esophageal cancer Neg Hx     Social History   Socioeconomic History   Marital status: Divorced    Spouse name: Not on file   Number of children: Not  on file   Years of education: Not on file   Highest education level: Not on file  Occupational History   Not on file  Tobacco Use   Smoking status: Every Day    Packs/day: 0.50    Types: Cigarettes   Smokeless tobacco: Never  Vaping Use   Vaping Use: Never used  Substance and Sexual Activity   Alcohol use: Yes    Comment: weekends    Drug use: No   Sexual activity: Not on file  Other Topics Concern   Not on file  Social History Narrative   Not on file   Social Determinants of Health   Financial Resource Strain: Not on file  Food Insecurity: Not on file  Transportation Needs: Not on file  Physical Activity: Not on file  Stress: Not on file  Social Connections: Not on file  Intimate Partner Violence: Not on file    Outpatient Medications Prior to Visit  Medication Sig Dispense Refill   acetaminophen (TYLENOL) 650 MG CR tablet Take 1,300 mg by mouth daily with breakfast.     benzonatate (TESSALON) 200 MG capsule Take 1 capsule (200 mg total) by mouth 2 (two) times  daily as needed for cough. 20 capsule 0   diclofenac (VOLTAREN) 75 MG EC tablet Take 1 tablet (75 mg total) by mouth 2 (two) times daily. 60 tablet 3   DULoxetine (CYMBALTA) 60 MG capsule TAKE 1 CAPSULE BY MOUTH EVERY DAY 90 capsule 4   fexofenadine (ALLEGRA) 180 MG tablet Take 1 tablet (180 mg total) by mouth daily. 90 tablet 3   fluticasone (FLONASE) 50 MCG/ACT nasal spray PLACE 1-2 SPRAYS INTO BOTH NOSTRILS DAILY. 48 mL 4   fluticasone furoate-vilanterol (BREO ELLIPTA) 200-25 MCG/INH AEPB Inhale 1 puff into the lungs daily. 1 each 11   LORazepam (ATIVAN) 0.5 MG tablet Take 1 tablet (0.5 mg total) by mouth every 8 (eight) hours as needed for anxiety. 30 tablet 0   rizatriptan (MAXALT-MLT) 5 MG disintegrating tablet Take 1 tablet (5 mg total) by mouth as needed for migraine. May repeat in 2 hours if needed 10 tablet 3   topiramate (TOPAMAX) 50 MG tablet Take 1 tablet (50 mg total) by mouth daily. 90 tablet 3    traMADol (ULTRAM) 50 MG tablet Take 1-2 tablets (50-100 mg total) by mouth every 8 (eight) hours as needed for moderate pain. Maximum 6 tabs per day. 21 tablet 0   valsartan-hydrochlorothiazide (DIOVAN-HCT) 320-25 MG tablet TAKE 1/2 TABLET BY MOUTH EVERY DAY 45 tablet 0   zolpidem (AMBIEN) 5 MG tablet Take 1 tablet (5 mg total) by mouth at bedtime as needed for sleep. 30 tablet 1   albuterol (VENTOLIN HFA) 108 (90 Base) MCG/ACT inhaler Inhale 2 puffs into the lungs every 4 (four) hours as needed for wheezing or shortness of breath. 18 g 0   hyoscyamine (LEVSIN) 0.125 MG tablet Take one by mouth every 4-6 hours as needed for abdominal cramping or pain or diarrhea. 15 tablet 0   pantoprazole (PROTONIX) 40 MG tablet Take 1 tablet (40 mg total) by mouth daily. 30 tablet 3   No facility-administered medications prior to visit.    Allergies  Allergen Reactions   Acetaminophen-Codeine Other (See Comments)    Shortness of breath. This was the codeine component, not tylenol.   Iodine Solution [Povidone Iodine] Rash   Oxycodone Itching    ROS Review of Systems    Objective:    Physical Exam  BP 117/69   Pulse 91   Ht 5' 5"  (1.651 m)   Wt 264 lb (119.7 kg)   LMP 09/06/2018 (Exact Date) Comment: patient signed preg test waiver  SpO2 99%   PF 400 L/min Comment: 344-430 green zone  BMI 43.93 kg/m  Wt Readings from Last 3 Encounters:  05/05/21 264 lb (119.7 kg)  04/21/21 260 lb (117.9 kg)  04/11/21 260 lb (117.9 kg)     Health Maintenance Due  Topic Date Due   HIV Screening  Never done   Hepatitis C Screening  Never done   COVID-19 Vaccine (2 - Moderna risk series) 01/13/2020   TETANUS/TDAP  06/07/2020    There are no preventive care reminders to display for this patient.  Lab Results  Component Value Date   TSH 0.92 10/02/2019   Lab Results  Component Value Date   WBC 5.4 03/29/2021   HGB 14.5 03/29/2021   HCT 41.1 03/29/2021   MCV 87.8 03/29/2021   PLT 248 03/29/2021    Lab Results  Component Value Date   NA 138 03/29/2021   K 3.3 (L) 03/29/2021   CO2 29 03/29/2021   GLUCOSE 99 03/29/2021   BUN 9 03/29/2021  CREATININE 0.83 03/29/2021   BILITOT 1.2 03/29/2021   ALKPHOS 121 03/29/2021   AST 60 (H) 03/29/2021   ALT 56 (H) 03/29/2021   PROT 6.9 03/29/2021   ALBUMIN 3.9 03/29/2021   CALCIUM 9.5 03/29/2021   ANIONGAP 9 03/29/2021   Lab Results  Component Value Date   CHOL 215 (H) 10/02/2019   Lab Results  Component Value Date   HDL 59 10/02/2019   Lab Results  Component Value Date   LDLCALC 131 (H) 10/02/2019   Lab Results  Component Value Date   TRIG 140 10/02/2019   Lab Results  Component Value Date   CHOLHDL 3.6 10/02/2019   Lab Results  Component Value Date   HGBA1C 5.8 (H) 01/19/2015      Assessment & Plan:   Problem List Items Addressed This Visit   None Visit Diagnoses     Post-COVID chronic cough    -  Primary   Post-COVID chronic fatigue          Post COVID cough-we discussed options lung exam is actually clear today which is reassuring so we will hold off on chest x-ray for further work-up I am can send over new albuterol inhaler that is up-to-date.  We will also get a do a prednisone burst as I do think it is causing an asthma exacerbation as well.  And we will go ahead and put her on 7 days of doxycycline since she has been blowing out green-colored mucus as well as she could have a concomitant sinusitis that is causing the persistent cough as well.  If not better in 1 week then please give [place] a call back.  Asthma exacerbation-we will add prednisone to her Breo and refill her albuterol.  Chronic fatigue post COVID-unfortunately there is not a lot we can do to make this better.  I do think getting her cough under control so that she can get better sleep at night and better rest will help.  She is also actively treating a severe episode of depression and will be beginning inpatient treatment next week.   Meds  ordered this encounter  Medications   predniSONE (DELTASONE) 20 MG tablet    Sig: Take 2 tablets (40 mg total) by mouth daily with breakfast.    Dispense:  10 tablet    Refill:  0   albuterol (VENTOLIN HFA) 108 (90 Base) MCG/ACT inhaler    Sig: Inhale 2 puffs into the lungs every 4 (four) hours as needed for wheezing or shortness of breath.    Dispense:  18 g    Refill:  0   doxycycline (VIBRA-TABS) 100 MG tablet    Sig: Take 1 tablet (100 mg total) by mouth 2 (two) times daily.    Dispense:  14 tablet    Refill:  0    Follow-up: Return if symptoms worsen or fail to improve.    Beatrice Lecher, MD

## 2021-05-11 ENCOUNTER — Other Ambulatory Visit: Payer: Self-pay

## 2021-05-11 ENCOUNTER — Encounter: Payer: Self-pay | Admitting: Sports Medicine

## 2021-05-11 ENCOUNTER — Ambulatory Visit: Payer: Managed Care, Other (non HMO) | Admitting: Sports Medicine

## 2021-05-11 DIAGNOSIS — F321 Major depressive disorder, single episode, moderate: Secondary | ICD-10-CM | POA: Diagnosis not present

## 2021-05-11 MED ORDER — BUPROPION HCL ER (XL) 150 MG PO TB24: 150.0000 mg | ORAL_TABLET | ORAL | 3 refills | Status: DC

## 2021-05-11 NOTE — Progress Notes (Signed)
    Procedures performed today:    None.  Independent interpretation of notes and tests performed by another provider:   None.  Brief History, Exam, Impression, and Recommendations:    Depression, major, single episode, moderate ([place]) This is a pleasant 50 year old female, we have been treating her for severe depression, she has had a couple of stressors including witnessing her daughter's suicide attempt. At the last visit she had been doing well on 60 mg of Cymbalta. She is having a recurrence of her depressive symptoms without suicidal or homicidal ideation. Increasing Cymbalta to 120 mg, we will do 1 tablet twice daily, also adding Wellbutrin 150 daily. She will also start intensive outpatient psychiatric treatment, as she is currently unable to work, I did fill out FMLA and disability paperwork today. She is doing quite well and is very happy with her behavioral therapy with Juliann Pulse here.  Chronic process with exacerbation and pharmacologic intervention  ___________________________________________ Gwen Her. [name], M.D., ABFM., CAQSM. Primary Care and [place] Instructor of [place] [employer] [facility]

## 2021-05-11 NOTE — Assessment & Plan Note (Signed)
This is a pleasant 50 year old female, we have been treating her for severe depression, she has had a couple of stressors including witnessing her daughter's suicide attempt. At the last visit she had been doing well on 60 mg of Cymbalta. She is having a recurrence of her depressive symptoms without suicidal or homicidal ideation. Increasing Cymbalta to 120 mg, we will do 1 tablet twice daily, also adding Wellbutrin 150 daily. She will also start intensive outpatient psychiatric treatment, as she is currently unable to work, I did fill out FMLA and disability paperwork today. She is doing quite well and is very happy with her behavioral therapy with Juliann Pulse here.

## 2021-05-12 ENCOUNTER — Ambulatory Visit (INDEPENDENT_AMBULATORY_CARE_PROVIDER_SITE_OTHER): Payer: 59 | Admitting: Professional

## 2021-05-12 ENCOUNTER — Inpatient Hospital Stay: Payer: Managed Care, Other (non HMO) | Admitting: Hematology & Oncology

## 2021-05-12 ENCOUNTER — Inpatient Hospital Stay: Payer: Managed Care, Other (non HMO) | Attending: Hematology & Oncology

## 2021-05-12 ENCOUNTER — Encounter: Payer: Self-pay | Admitting: Hematology & Oncology

## 2021-05-12 DIAGNOSIS — F4323 Adjustment disorder with mixed anxiety and depressed mood: Secondary | ICD-10-CM

## 2021-05-13 ENCOUNTER — Other Ambulatory Visit: Payer: Self-pay

## 2021-05-13 ENCOUNTER — Ambulatory Visit: Payer: 59 | Admitting: Professional

## 2021-05-13 ENCOUNTER — Other Ambulatory Visit (HOSPITAL_COMMUNITY): Payer: 59 | Attending: Psychiatry

## 2021-05-13 DIAGNOSIS — F332 Major depressive disorder, recurrent severe without psychotic features: Secondary | ICD-10-CM | POA: Insufficient documentation

## 2021-05-13 DIAGNOSIS — Z9151 Personal history of suicidal behavior: Secondary | ICD-10-CM | POA: Insufficient documentation

## 2021-05-13 DIAGNOSIS — Z79899 Other long term (current) drug therapy: Secondary | ICD-10-CM | POA: Insufficient documentation

## 2021-05-13 DIAGNOSIS — F17210 Nicotine dependence, cigarettes, uncomplicated: Secondary | ICD-10-CM | POA: Insufficient documentation

## 2021-05-13 DIAGNOSIS — Z8659 Personal history of other mental and behavioral disorders: Secondary | ICD-10-CM | POA: Insufficient documentation

## 2021-05-13 DIAGNOSIS — Z885 Allergy status to narcotic agent status: Secondary | ICD-10-CM | POA: Insufficient documentation

## 2021-05-13 DIAGNOSIS — R45851 Suicidal ideations: Secondary | ICD-10-CM | POA: Insufficient documentation

## 2021-05-18 ENCOUNTER — Other Ambulatory Visit: Payer: Self-pay

## 2021-05-18 ENCOUNTER — Ambulatory Visit: Payer: 59 | Admitting: Professional

## 2021-05-18 ENCOUNTER — Other Ambulatory Visit (HOSPITAL_COMMUNITY): Payer: 59 | Admitting: Occupational Therapy

## 2021-05-18 ENCOUNTER — Encounter (HOSPITAL_COMMUNITY): Payer: Self-pay

## 2021-05-18 ENCOUNTER — Other Ambulatory Visit (HOSPITAL_COMMUNITY): Payer: 59 | Admitting: Licensed Clinical Social Worker

## 2021-05-18 DIAGNOSIS — Z8659 Personal history of other mental and behavioral disorders: Secondary | ICD-10-CM | POA: Diagnosis not present

## 2021-05-18 DIAGNOSIS — R41844 Frontal lobe and executive function deficit: Secondary | ICD-10-CM

## 2021-05-18 DIAGNOSIS — F332 Major depressive disorder, recurrent severe without psychotic features: Secondary | ICD-10-CM

## 2021-05-18 DIAGNOSIS — Z9151 Personal history of suicidal behavior: Secondary | ICD-10-CM | POA: Diagnosis not present

## 2021-05-18 DIAGNOSIS — Z79899 Other long term (current) drug therapy: Secondary | ICD-10-CM | POA: Diagnosis not present

## 2021-05-18 DIAGNOSIS — Z885 Allergy status to narcotic agent status: Secondary | ICD-10-CM | POA: Diagnosis not present

## 2021-05-18 DIAGNOSIS — F17210 Nicotine dependence, cigarettes, uncomplicated: Secondary | ICD-10-CM | POA: Diagnosis not present

## 2021-05-18 DIAGNOSIS — R45851 Suicidal ideations: Secondary | ICD-10-CM | POA: Diagnosis not present

## 2021-05-18 DIAGNOSIS — R4589 Other symptoms and signs involving emotional state: Secondary | ICD-10-CM

## 2021-05-18 MED ORDER — HYDROXYZINE HCL 25 MG PO TABS: 25.0000 mg | ORAL_TABLET | Freq: Every evening | ORAL | 0 refills | Status: DC | PRN

## 2021-05-18 MED ORDER — HYDROXYZINE HCL 25 MG PO TABS: 25.0000 mg | ORAL_TABLET | Freq: Three times a day (TID) | ORAL | 0 refills | Status: DC | PRN

## 2021-05-18 NOTE — Therapy (Signed)
[facility] [place], [place], [address] Phone: [phone]   Fax:  [phone] Virtual Visit via Video Note  I connected with Heather Salazar on 05/18/21 at  11:00 AM EDT by a video enabled telemedicine application and verified that I am speaking with the correct person using two identifiers.  Location: Patient: Patient Home Provider: Clinic Office   I discussed the limitations of evaluation and management by telemedicine and the availability of in person appointments. The patient expressed understanding and agreed to proceed.   I discussed the assessment and treatment plan with the patient. The patient was provided an opportunity to ask questions and all were answered. The patient agreed with the plan and demonstrated an understanding of the instructions.   The patient was advised to call back or seek an in-person evaluation if the symptoms worsen or if the condition fails to improve as anticipated.  I provided 78 minutes of non-face-to-face time during this encounter. 60 minutes OT Group  18 minutes OT Evaluation  Ponciano Ort, OT   Occupational Therapy Evaluation  Patient Details  Name: Heather Salazar MRN: [id number] Date of Birth: 07/02/1971 Referring Provider (OT): Ricky Ala   Encounter Date: 05/18/2021   OT End of Session - 05/18/21 1457     Visit Number 1    Number of Visits 20    Date for OT Re-Evaluation 06/15/21    Authorization Type [employer]    OT Start Time 1110   OT Eval [phone]   OT Stop Time 1210    OT Time Calculation (min) 60 min    Activity Tolerance Patient tolerated treatment well    Behavior During Therapy WFL for tasks assessed/performed             Past Medical History:  Diagnosis Date   Anxiety    Arthritis    back    Asthma    childhood   GERD (gastroesophageal reflux disease)    History of colon polyps    Hypertension     Past Surgical History:   Procedure Laterality Date   CESAREAN SECTION     CHOLECYSTECTOMY N/A 03/06/2018   Procedure: LAPAROSCOPIC CHOLECYSTECTOMY ERAS PATHWAY;  Surgeon: Clovis Riley, MD;  Location: [facility];  Service: General;  Laterality: N/A;   COLONOSCOPY     In [place] [phone] did have a small polyp   tubaligation      There were no vitals filed for this visit.   Subjective Assessment - 05/18/21 1455     Currently in Pain? No/denies    Multiple Pain Sites No               [facility] OT Assessment - 05/18/21 0001       Assessment   Medical Diagnosis Major depression    Referring Provider (OT) Ricky Ala      Precautions   Precautions None      Balance Screen   Has the patient fallen in the past 6 months No    Has the patient had a decrease in activity level because of a fear of falling?  No    Is the patient reluctant to leave their home because of a fear of falling?  No             OT Education - 05/18/21 1455     Education Details Educated on OT role within PHP in addition to physical symptomology of stress and its effects on  the body, along with positive stress management tips/strategies    Person(s) Educated Patient    Methods Explanation;Handout    Comprehension Verbalized understanding              OT Short Term Goals - 05/18/21 1510       OT SHORT TERM GOAL #1   Title Pt will actively engage in OT group sessions throughout duration of PHP programming, in order to promote daily structure, social engagement, and opportunities to develop and utilize adaptive strategies to maximize functional performance in preparation for safe transition and integration back into school, work, and the community.    Time 4    Period Weeks    Status New    Target Date 06/15/21      OT SHORT TERM GOAL #2   Title Pt will identify and implement 1-3 sleep hygiene strategies she can utilize, in order to improve sleep quality/ADL performance, in preparation for safe  and healthy reintegration back into the community at discharge.    Time 4    Period Weeks    Status New    Target Date 06/15/21      OT SHORT TERM GOAL #3   Title Pt will identify 1-3 stress management strategies she can utilize, in order to safely manage increased psychosocial stressors identified, with min cues, in preparation for safe transition back to the community at discharge.    Time 4    Period Weeks    Target Date 06/15/21           Occupational Therapy Assessment 05/18/2021  [place] is a 50 y/o female with PMHx of depression and anxiety who was referred to the [facility] program from her OP therapist with reports of worsening depression/anxiety in the context of her daughter struggling with her own mental health and attempting suicide. Pt lives alone, is divorced, and has two adult children. 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 Pt also reports difficulty engaging in ADL/iADLs including taking a shower and doing her hair. Pt reports a desire to engage in [place] programming in order to manage identified stressors and to engage meaningfully in identified areas of occupation and ADL/iADLs. Upon approach, pt presents as calm, cooperative, and forth coming with information for OT Evaluation. Pt reports enjoying spending time with family, however reports she is primarily a home body and does not have hobbies.   Precautions/Limitations: None noted/observed  Cognition: WFL   Visual Motor: WFL   Living Situation: Pt lives alone with her support dog  School/Work: Pt works FT in Probation officer role for [employer]  ADL/iADL Performance: Pt reports difficulty with ADL/iADLs including taking a shower and doing her hair   Leisure Hydrologist: Denies any interests/hobbies  Social Support: Seeks support from family and friends    What do you do when you are very stressed, angry, upset, sad or anxious? Isolate from others, Use drugs/alcohol, and Sleep   What helps  when you are not feeling well? Wrapping in a blanket, Additional/Extra medication, Taking a shower or bath, Watching TV, and Playing a game  What are some things that make it MORE difficult for you when you are already upset? Not being able to express my opinion, Loud noises, Being criticized, Yelling, and Particular time of year  Is there anything specific that you would like help with while you're in the partial hospitalization program? Coping Skills, Relationships, Stress Management, Self-Care, Sleep, and Nutrition  What is your  goal while you are here?  None noted*  Assessment: Pt demonstrates behavior that inhibits/restricts participation in occupation and would benefit from skilled occupational therapy services to address current difficulties with symptom management, emotion regulation, socialization, stress management, time management, job readiness, financial wellness, health and nutrition, sleep hygiene, ADL/iADL performance and leisure participation, in preparation for reintegration and return to community at discharge.   Plan: Pt will participate in skilled occupational therapy sessions (group and/or individual) in order to promote daily structure, social engagement, and opportunities to develop and utilize adaptive strategies to maximize functional performance in preparation for safe transition and integration back into school, work, and/or the community at discharge. OT sessions will occur 4-5 x per week for 2-4 weeks.   Ponciano Ort, MOT, OTR/L  Group Session:  S: "Work is a Runner, broadcasting/film/video for me."  O:  Group began with a check-in and review of previous OT session focused on self-care. Today's discussion focused on the topic of stress management. Group members worked collaboratively to create a Environmental consultant identifying physical signs, behavioral signs, emotional/psychological, and cognitive signs of stress. Discussion then focused and encouraged group members to  identify positive stress management strategies they could utilize in those moments to manage identified signs.  A: Heather Salazar was active and independent in her participation of discussion and activity, sharing that she struggles with stress at work and the recent struggles with her daughter and her mental health. Pt was an active participant in group brainstorm/discussion and shared strategies, both positive and negative that she has used in the past to manage her stressors.   P: Continue to attend PHP OT group sessions 5x week for 4 weeks to promote daily structure, social engagement, and opportunities to develop and utilize adaptive strategies to maximize functional performance in preparation for safe transition and integration back into school, work, and the community. Plan to address topic of communication in next OT group session.  Plan - 05/18/21 1500     Clinical Impression Statement Heather Salazar is a 50 y/o female with PMHx of depression and anxiety who was referred to the [facility] program from her OP therapist with reports of worsening depression/anxiety in the context of her daughter struggling with her own mental health and attempting suicide. Pt lives alone, is divorced, and has two adult children. 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 Pt also reports difficulty engaging in ADL/iADLs including taking a shower and doing her hair. Pt reports a desire to engage in [place] programming in order to manage identified stressors and to engage meaningfully in identified areas of occupation and ADL/iADLs.    OT Occupational Profile and History Problem Focused Assessment - Including review of records relating to presenting problem    Occupational performance deficits (Please refer to evaluation for details): ADL's;IADL's;Rest and Sleep;Education;Work;Leisure;Social Participation    Body Structure / Function / Physical Skills ADL    Cognitive Skills  Attention;Emotional;Energy/Drive;Learn;Memory;Perception;Problem Solve;Safety Awareness;Temperament/Personality;Thought;Understand    Psychosocial Skills Coping Strategies;Environmental  Adaptations;Habits;Interpersonal Interaction;Routines and Behaviors    Rehab Potential Good    Clinical Decision Making Limited treatment options, no task modification necessary    Comorbidities Affecting Occupational Performance: May have comorbidities impacting occupational performance    Modification or Assistance to Complete Evaluation  No modification of tasks or assist necessary to complete eval    OT Frequency 5x / week    OT Duration 4 weeks    OT Treatment/Interventions Self-care/ADL training;Patient/family education;Coping strategies training;Psychosocial skills training  Consulted and Agree with Plan of Care Patient             Patient will benefit from skilled therapeutic intervention in order to improve the following deficits and impairments:   Body Structure / Function / Physical Skills: ADL Cognitive Skills: Attention, Emotional, Energy/Drive, Learn, Memory, Perception, Problem Solve, Safety Awareness, Temperament/Personality, Thought, Understand Psychosocial Skills: Coping Strategies, Environmental  Adaptations, Habits, Interpersonal Interaction, Routines and Behaviors   Visit Diagnosis: Difficulty coping  Frontal lobe and executive function deficit  Severe episode of recurrent major depressive disorder, without psychotic features [facility])    Problem List Patient Active Problem List   Diagnosis Date Noted   Pain of left calf 04/21/2021   Retroperitoneal lymphadenopathy 12/21/2020   Stool incontinence 12/10/2020   Polyarthralgia 12/10/2020   Depression, major, single episode, moderate ([place]) 11/12/2020   Lumbar degenerative disc disease 11/12/2020   Persistent asthma without complication 85/46/2703   Smoker 11/29/2019   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

## 2021-05-18 NOTE — Progress Notes (Signed)
Virtual Visit via Video Note  I connected with Heather Salazar on 05/20/21 at  2:00 PM EDT by a video enabled telemedicine application and verified that I am speaking with the correct person using two identifiers.  Location: Patient: Home  Provider: Home Office   I discussed the limitations of evaluation and management by telemedicine and the availability of in person appointments. The patient expressed understanding and agreed to proceed.      I discussed the assessment and treatment plan with the patient. The patient was provided an opportunity to ask questions and all were answered. The patient agreed with the plan and demonstrated an understanding of the instructions.   The patient was advised to call back or seek an in-person evaluation if the symptoms worsen or if the condition fails to improve as anticipated.  I provided 15 minutes of non-face-to-face time during this encounter.   Derrill Center, NP    Behavioral Health Partial Program Assessment Note  Date: 05/18/2021 Name: Heather Salazar MRN: [id number]    ZYS:[id number] [name] is a 50 y.o. African American female presents with depression and passive suicidal ideations. Patient presents with multiple stressors. Stated that her 48 year old daughter recently attempted suicide. Stated " I know that she was struggling in her relationship."  Reported  " I know that she has a hard time asking for help, she gets that from me."  Haylyn stated she is currently followed by primary care provider where she is prescribed Wellbutrin, Cymbalta and Ambien for sleep.  However reports Ambien has not been effective with her reported sleepiness.  Discussed initiating hydroxyzine 25 to 50 mg for sedation.  Patient was receptive to plan.  She reports previous inpatient admissions states her most recent admission was 1 year prior.  Patient was enrolled in partial psychiatric program on 05/18/21.  Primary complaints include: agitation, anxiety,  depression worse, difficulty sleeping, feeling depressed, and feeling suicidal.  Onset of symptoms was gradual with gradually worsening course since that time. Psychosocial Stressors include the following: family and financial.  Reports drinking alcohol occasionally.  Denies illicit drug use.  States her anxiety comes from COVID related symptoms.  States she has been COVID-positive for the past 3 times.  States her job is stressful as she works for [employer].  Reports a good relationship between she and her children.  States her youngest resides in [place].  Denies any other reported family history.  I have reviewed the following documentation dated 05/20/2021: past psychiatric history, past medical history, and past social and family history  Complaints of Pain: nonear Past Psychiatric History:  First psychiatric contact , Past psychiatric hospitalizations  and Previous suicide attempts   Currently in treatment with   Substance Abuse History:  Use of Alcohol: Occasional use Use of Caffeine: denies use Use of over the counter:   Past Surgical History:  Procedure Laterality Date   CESAREAN SECTION     CHOLECYSTECTOMY N/A 03/06/2018   Procedure: [place];  Surgeon: Clovis Riley, MD;  Location: [facility];  Service: General;  Laterality: N/A;   COLONOSCOPY     In [place] [phone] did have a small polyp   tubaligation      Past Medical History:  Diagnosis Date   Anxiety    Arthritis    back    Asthma    childhood   GERD (gastroesophageal reflux disease)    History of colon polyps    Hypertension    Outpatient  Encounter Medications as of 05/18/2021  Medication Sig   hydrOXYzine (ATARAX/VISTARIL) 25 MG tablet Take 1 tablet (25 mg total) by mouth 3 (three) times daily as needed.   acetaminophen (TYLENOL) 650 MG CR tablet Take 1,300 mg by mouth daily with breakfast.   albuterol (VENTOLIN HFA) 108 (90 Base) MCG/ACT inhaler  Inhale 2 puffs into the lungs every 4 (four) hours as needed for wheezing or shortness of breath.   benzonatate (TESSALON) 200 MG capsule Take 1 capsule (200 mg total) by mouth 2 (two) times daily as needed for cough.   buPROPion (WELLBUTRIN XL) 150 MG 24 hr tablet Take 1 tablet (150 mg total) by mouth every morning.   diclofenac (VOLTAREN) 75 MG EC tablet Take 1 tablet (75 mg total) by mouth 2 (two) times daily.   doxycycline (VIBRA-TABS) 100 MG tablet Take 1 tablet (100 mg total) by mouth 2 (two) times daily.   DULoxetine (CYMBALTA) 60 MG capsule Take 1 capsule (60 mg total) by mouth 2 (two) times daily.   fexofenadine (ALLEGRA) 180 MG tablet Take 1 tablet (180 mg total) by mouth daily.   fluticasone (FLONASE) 50 MCG/ACT nasal spray PLACE 1-2 SPRAYS INTO BOTH NOSTRILS DAILY.   fluticasone furoate-vilanterol (BREO ELLIPTA) 200-25 MCG/INH AEPB Inhale 1 puff into the lungs daily.   LORazepam (ATIVAN) 0.5 MG tablet Take 1 tablet (0.5 mg total) by mouth every 8 (eight) hours as needed for anxiety.   predniSONE (DELTASONE) 20 MG tablet Take 2 tablets (40 mg total) by mouth daily with breakfast.   rizatriptan (MAXALT-MLT) 5 MG disintegrating tablet Take 1 tablet (5 mg total) by mouth as needed for migraine. May repeat in 2 hours if needed   topiramate (TOPAMAX) 50 MG tablet Take 1 tablet (50 mg total) by mouth daily.   traMADol (ULTRAM) 50 MG tablet Take 1-2 tablets (50-100 mg total) by mouth every 8 (eight) hours as needed for moderate pain. Maximum 6 tabs per day.   valsartan-hydrochlorothiazide (DIOVAN-HCT) 320-25 MG tablet TAKE 1/2 TABLET BY MOUTH EVERY DAY   zolpidem (AMBIEN) 5 MG tablet Take 1 tablet (5 mg total) by mouth at bedtime as needed for sleep.   No facility-administered encounter medications on file as of 05/18/2021.   Allergies  Allergen Reactions   Acetaminophen-Codeine Other (See Comments)    Shortness of breath. This was the codeine component, not tylenol.   Iodine Solution  [Povidone Iodine] Rash   Oxycodone Itching    Social History   Tobacco Use   Smoking status: Every Day    Packs/day: 0.50    Types: Cigarettes   Smokeless tobacco: Never  Substance Use Topics   Alcohol use: Yes    Comment: weekends    Functioning Relationships: good support system and good relationship with children Education: Other (Specify any learning disability, behavioral disorder, special education needs, difficulty reading.):  Other Pertinent History: None Family History  Problem Relation Age of Onset   Asthma Mother    Heart failure Father    Asthma Sister    Asthma Brother    Diabetes Neg Hx    Colon cancer Neg Hx    Esophageal cancer Neg Hx      Review of Systems Constitutional: negative  Objective:  There were no vitals filed for this visit.  Physical Exam:   Mental Status Exam: Appearance:  Well groomed Psychomotor::  Restless & fidgety Attention span and concentration: Normal Behavior: calm and cooperative Speech:  normal volume Mood:  depressed and anxious Affect:  normal  Thought Process:  Coherent Thought Content:  Logical Orientation:  person, place, and time/date Cognition:  grossly intact Insight:  Intact Judgment:  Intact Estimate of Intelligence: Average Fund of knowledge: Aware of current events Memory: Recent and remote intact, Remote intact, and Recent intact Abnormal movements: None Gait and station: Normal  Assessment:  Diagnosis: Severe episode of recurrent major depressive disorder, without psychotic features ([place]) [F33.2] 1. Severe episode of recurrent major depressive disorder, without psychotic features ([place])     Indications for admission: inpatient care required if not in partial hospital program  Plan: Orders placed for Occupational Therapy  Patient  was initiated on hydroxyzine 25 mg p.o. nightly as needed.  Patient reports side effects "of swollen  bottom lip."Patient to discontinue medication.   Reported she has  tried trazodone and Remeron in the past.  Consider increasing Ambien 5 mg to 10 mg p.o. nightly.  NP will follow-up with patient's primary care provider.   patient enrolled in Partial Hospitalization Program, patient's current medications are to be continued, a comprehensive treatment plan will be developed, and side effects of medications have been reviewed with patient  Treatment options and alternatives reviewed with patient and patient understands the above plan. Treatment plan was reviewed and agreed upon by NP T.Bobby Rumpf and patient Vernestine Brodhead need for group services.     Derrill Center, NP

## 2021-05-19 ENCOUNTER — Other Ambulatory Visit (HOSPITAL_COMMUNITY): Payer: 59 | Admitting: Licensed Clinical Social Worker

## 2021-05-19 ENCOUNTER — Encounter: Payer: Self-pay | Admitting: *Deleted

## 2021-05-19 ENCOUNTER — Other Ambulatory Visit (HOSPITAL_COMMUNITY): Payer: 59 | Admitting: Occupational Therapy

## 2021-05-19 ENCOUNTER — Encounter (HOSPITAL_COMMUNITY): Payer: Self-pay

## 2021-05-19 ENCOUNTER — Other Ambulatory Visit: Payer: Self-pay

## 2021-05-19 DIAGNOSIS — F332 Major depressive disorder, recurrent severe without psychotic features: Secondary | ICD-10-CM

## 2021-05-19 DIAGNOSIS — R41844 Frontal lobe and executive function deficit: Secondary | ICD-10-CM

## 2021-05-19 DIAGNOSIS — R4589 Other symptoms and signs involving emotional state: Secondary | ICD-10-CM

## 2021-05-19 NOTE — Progress Notes (Signed)
Pt attended spiritual care group 11:00-12:00. Group met via web-ex due to COVID-19 precautions. Group facilitated by Kathrynn Humble, [place]  Group focused on topic of "community." Members reflected on topic in facilitated dialog, identifying responses to topic and notions they hold of community from their previous experience. Group members utilized value sort cards to identify top qualities they look for in community. Engaged in facilitated dialog around their value choices, noting origin of these values, how these are realized in their lives, and strategies for engaging these values.  Spiritual care group drew on Motivational Interviewing, Narrative and Adlerian modalities  Patient Progress:  Heather Salazar.  Chaplain Janne Napoleon, [place] Pager, [phone] 5:01 PM

## 2021-05-19 NOTE — Therapy (Signed)
[place], [place], [address] Phone: [phone]   Fax:  [phone] Virtual Visit via Video Note  I connected with Heather Salazar on 05/19/21 at  12:00 PM EDT by a video enabled telemedicine application and verified that I am speaking with the correct person using two identifiers.  Location: Patient: Patient Home Provider: Clinic Office   I discussed the limitations of evaluation and management by telemedicine and the availability of in person appointments. The patient expressed understanding and agreed to proceed.   I discussed the assessment and treatment plan with the patient. The patient was provided an opportunity to ask questions and all were answered. The patient agreed with the plan and demonstrated an understanding of the instructions.   The patient was advised to call back or seek an in-person evaluation if the symptoms worsen or if the condition fails to improve as anticipated.  I provided 50 minutes of non-face-to-face time during this encounter.   Ponciano Ort, OT   Occupational Therapy Treatment  Patient Details  Name: Heather Salazar MRN: [id number] Date of Birth: 12/23/1970 Referring Provider (OT): Ricky Ala   Encounter Date: 05/19/2021   OT End of Session - 05/19/21 1450     Visit Number 2    Number of Visits 20    Date for OT Re-Evaluation 06/15/21    Authorization Type [employer]    OT Start Time 1200    OT Stop Time 1250    OT Time Calculation (min) 50 min    Activity Tolerance Patient tolerated treatment well    Behavior During Therapy WFL for tasks assessed/performed             Past Medical History:  Diagnosis Date   Anxiety    Arthritis    back    Asthma    childhood   GERD (gastroesophageal reflux disease)    History of colon polyps    Hypertension     Past Surgical History:  Procedure Laterality Date   CESAREAN SECTION     CHOLECYSTECTOMY  N/A 03/06/2018   Procedure: LAPAROSCOPIC CHOLECYSTECTOMY ERAS PATHWAY;  Surgeon: Clovis Riley, MD;  Location: [facility];  Service: General;  Laterality: N/A;   COLONOSCOPY     In [place] [phone] did have a small polyp   tubaligation      There were no vitals filed for this visit.   Subjective Assessment - 05/19/21 1443     Currently in Pain? No/denies             OT Education - 05/19/21 1443     Education Details Educated on sleep hygiene and strategies to improve overall sleep quality    Person(s) Educated Patient    Methods Explanation;Handout    Comprehension Verbalized understanding              OT Short Term Goals - 05/19/21 1451       OT SHORT TERM GOAL #1   Status On-going      OT SHORT TERM GOAL #2   Status On-going      OT SHORT TERM GOAL #3   Status On-going           Group Session:  S: "I have trouble falling asleep"  O: Today's group discussion focused on topic of Sleep Hygiene. Patients reflected on the quality of sleep they typically receive and identified areas that need improvement. Group was given background information on sleep and  sleep hygiene, including common sleep disorders. Group members also received information on how to improve one's sleep and introduced a sleep diary as a tool that can be utilized to track sleep quality over a length of time. Group session ended with patients identifying one or more strategies they could utilize or implement into their sleep routine in order to improve overall sleep quality.    A: Heather Salazar was active and independent in her participation of group discussion, sharing that she struggles with falling asleep and getting restful sleep, sharing that she often tosses and turns a lot, and most recently, had to sleep sitting up due to her back problems. Overall, pt appeared open and receptive to receiving information and education on sleep hygiene.   P: Continue to attend PHP OT group  sessions 5x week for 3 weeks to promote daily structure, social engagement, and opportunities to develop and utilize adaptive strategies to maximize functional performance in preparation for safe transition and integration back into school, work, and the community. Plan to address topic of communication in next OT group session.  Plan - 05/19/21 1451     Occupational performance deficits (Please refer to evaluation for details): ADL's;IADL's;Rest and Sleep;Education;Work;Leisure;Social Participation    Body Structure / Function / Physical Skills ADL    Cognitive Skills Attention;Emotional;Energy/Drive;Learn;Memory;Perception;Problem Solve;Safety Awareness;Temperament/Personality;Thought;Understand    Psychosocial Skills Coping Strategies;Environmental  Adaptations;Habits;Interpersonal Interaction;Routines and Behaviors             Patient will benefit from skilled therapeutic intervention in order to improve the following deficits and impairments:   Body Structure / Function / Physical Skills: ADL Cognitive Skills: Attention, Emotional, Energy/Drive, Learn, Memory, Perception, Problem Solve, Safety Awareness, Temperament/Personality, Thought, Understand Psychosocial Skills: Coping Strategies, Environmental  Adaptations, Habits, Interpersonal Interaction, Routines and Behaviors   Visit Diagnosis: Difficulty coping  Frontal lobe and executive function deficit  Severe episode of recurrent major depressive disorder, without psychotic features [facility])    Problem List Patient Active Problem List   Diagnosis Date Noted   Pain of left calf 04/21/2021   Retroperitoneal lymphadenopathy 12/21/2020   Stool incontinence 12/10/2020   Polyarthralgia 12/10/2020   Depression, major, single episode, moderate (HCC) 11/12/2020   Lumbar degenerative disc disease 11/12/2020   Persistent asthma without complication 48/88/9169   Smoker 11/29/2019   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   Insomnia 03/05/2019    COVID-19 12/31/2018   Transaminitis 09/14/2018   Gastro-esophageal reflux 09/13/2018   Benign essential hypertension 45/12/8880   Biliary colic 80/12/4915   Annual physical exam 01/16/2015   Polycystic ovarian syndrome 01/16/2015   Obesity (BMI 35.0-39.9 without comorbidity) 01/16/2015    05/19/2021  Ponciano Ort, MOT, OTR/L  05/19/2021, 2:52 PM  [facility] [place], [place], [address] Phone: [phone]   Fax:  [phone]  Name: Heather Salazar MRN: [id number] Date of Birth: 31-Jul-1971

## 2021-05-19 NOTE — Progress Notes (Signed)
Patient missed her scheduled follow up appointment last week due to appointment conflicts. She has daily appointments from 9a-1p and needs to reschedule to an afternoon appointment.  Patient is rescheduled for 06/03/21 at 1:45p.   Oncology Nurse Navigator Documentation  Oncology Nurse Navigator Flowsheets 05/19/2021  Abnormal Finding Date -  Diagnosis Status -  Navigator Follow Up Date: 06/03/2021  Navigator Follow Up Reason: Follow-up Appointment  Navigator Location [facility]  Navigator Encounter Type Telephone  Telephone Appt Confirmation/Clarification;Outgoing Call  Patient Visit Type MedOnc  Treatment Phase Abnormal Scans  Barriers/Navigation Needs Coordination of Care  Education -  Interventions Coordination of Care;Psycho-Social Support  Acuity Level 2-Minimal Needs (1-2 Barriers Identified)  Coordination of Care Appts  Education Method -  Support Groups/Services Friends and Family  Time Spent with Patient 30

## 2021-05-20 ENCOUNTER — Other Ambulatory Visit (HOSPITAL_COMMUNITY): Payer: 59 | Admitting: Licensed Clinical Social Worker

## 2021-05-20 ENCOUNTER — Encounter (HOSPITAL_COMMUNITY): Payer: Self-pay | Admitting: Family

## 2021-05-20 ENCOUNTER — Other Ambulatory Visit (HOSPITAL_COMMUNITY): Payer: 59 | Admitting: Occupational Therapy

## 2021-05-20 ENCOUNTER — Other Ambulatory Visit: Payer: Self-pay

## 2021-05-20 ENCOUNTER — Encounter (HOSPITAL_COMMUNITY): Payer: Self-pay

## 2021-05-20 DIAGNOSIS — F332 Major depressive disorder, recurrent severe without psychotic features: Secondary | ICD-10-CM

## 2021-05-20 DIAGNOSIS — R41844 Frontal lobe and executive function deficit: Secondary | ICD-10-CM

## 2021-05-20 DIAGNOSIS — R4589 Other symptoms and signs involving emotional state: Secondary | ICD-10-CM

## 2021-05-20 NOTE — Progress Notes (Signed)
Spoke with patient via Webex video call, used 2 identifiers to correctly identify patient. States this is her first time in PHP recommended by her therapist for depression/anxiety. Her main stressors are chronic back pain that prevents her from doing daily activities, job stress, and her daughter has major depression with several suicide attempts. She now has PTSD from watching her daughter struggle to talk and pull through after her overdose attempt. She sometimes feels like she sees gnats in her home but they disappear after she blinks her eyes a few times. Also hears a violin playing at night and walks around to make sure something isn't left playing music. Denies SI/HI. On scale 1-10 as 10 being worst she rates depression at 8 and anxiety at 9. PHQ9=18. No side effects form medications. No issues or complaints.

## 2021-05-21 ENCOUNTER — Other Ambulatory Visit (HOSPITAL_COMMUNITY): Payer: 59 | Admitting: Licensed Clinical Social Worker

## 2021-05-21 ENCOUNTER — Encounter (HOSPITAL_COMMUNITY): Payer: Self-pay

## 2021-05-21 ENCOUNTER — Other Ambulatory Visit: Payer: Self-pay

## 2021-05-21 DIAGNOSIS — F332 Major depressive disorder, recurrent severe without psychotic features: Secondary | ICD-10-CM

## 2021-05-21 NOTE — Therapy (Signed)
[facility] [place] [facility] [place], [place], [address] Phone: [phone]   Fax:  [phone] Virtual Visit via Video Note  I connected with Heather Salazar on 05/20/21 at  12:00 PM EDT by a video enabled telemedicine application and verified that I am speaking with the correct person using two identifiers.  Location: Patient: Patient Home Provider: Clinic Office    I discussed the limitations of evaluation and management by telemedicine and the availability of in person appointments. The patient expressed understanding and agreed to proceed.   I discussed the assessment and treatment plan with the patient. The patient was provided an opportunity to ask questions and all were answered. The patient agreed with the plan and demonstrated an understanding of the instructions.   The patient was advised to call back or seek an in-person evaluation if the symptoms worsen or if the condition fails to improve as anticipated.  I provided 50 minutes of non-face-to-face time during this encounter.   Ponciano Ort, OT   Occupational Therapy Treatment  Patient Details  Name: Heather Salazar MRN: [id number] Date of Birth: 09-29-1971 Referring Provider (OT): Ricky Ala   Encounter Date: 05/20/2021   OT End of Session - 05/21/21 0801     Visit Number 3    Number of Visits 20    Date for OT Re-Evaluation 06/15/21    Authorization Type [employer]igna No Deduct   Copay 60.00 per visit - collect one copay if multipliable services in one day.   No Co Ins   OOP [phone] met   Visit limit 60- 0 used  PT/OT/ ST/ Chiro/ Pulmonary (if diagnosis is for [facility] no visit limit)   No auth S/w [place]   Ref#[id number]    OT Start Time 1200    OT Stop Time 1250    OT Time Calculation (min) 50 min    Activity Tolerance Patient tolerated treatment well    Behavior During Therapy WFL for tasks assessed/performed             Past  Medical History:  Diagnosis Date   Anxiety    Arthritis    back    Asthma    childhood   Depression    GERD (gastroesophageal reflux disease)    History of colon polyps    Hypertension    PTSD (post-traumatic stress disorder)     Past Surgical History:  Procedure Laterality Date   CESAREAN SECTION     CHOLECYSTECTOMY N/A 03/06/2018   Procedure: LAPAROSCOPIC CHOLECYSTECTOMY ERAS PATHWAY;  Surgeon: Clovis Riley, MD;  Location: [facility];  Service: General;  Laterality: N/A;   COLONOSCOPY     In [place] [phone] did have a small polyp   tubaligation      There were no vitals filed for this visit.   Subjective Assessment - 05/21/21 0800     Currently in Pain? No/denies              OT Education - 05/21/21 0800     Education Details Educated on different communication styles and identified strategies/tips to practice being more assertive    Person(s) Educated Patient    Methods Explanation;Handout    Comprehension Verbalized understanding              OT Short Term Goals - 05/19/21 1451       OT SHORT TERM GOAL #1   Status On-going      OT SHORT TERM  GOAL #2   Status On-going      OT SHORT TERM GOAL #3   Status On-going           Group Session:  S: "I struggle the most with non-verbals and getting my face to be neutral and non-reactive."  O: Today's group focused on topic of Communication Styles. Group members were educated on the different styles including passive, aggressive, and assertive communication. Members shared and reflected on which style they most often find themselves communicating in and how to transition to a more assertive approach.   A: Heather Salazar was active in her participation of discussion during today's session and shared that she struggles most with nonverbal communication, noting that she often has to compose her facial features in order to not react. She shared that her face and non-verbal cues often  portray a different picture than how she is actually feeling and does not want others to misinterpret her cues. Appeared open and receptive to education provided on communication styles and offered support to other group members.   P: Continue to attend PHP OT group sessions 5x week for 3 weeks to promote daily structure, social engagement, and opportunities to develop and utilize adaptive strategies to maximize functional performance in preparation for safe transition and integration back into school, work, and the community. Plan to address topic of assertiveness in next OT group session.  Plan - 05/21/21 0801     Occupational performance deficits (Please refer to evaluation for details): ADL's;IADL's;Rest and Sleep;Education;Work;Leisure;Social Participation    Body Structure / Function / Physical Skills ADL    Cognitive Skills Attention;Emotional;Energy/Drive;Learn;Memory;Perception;Problem Solve;Safety Awareness;Temperament/Personality;Thought;Understand    Psychosocial Skills Coping Strategies;Environmental  Adaptations;Habits;Interpersonal Interaction;Routines and Behaviors             Patient will benefit from skilled therapeutic intervention in order to improve the following deficits and impairments:   Body Structure / Function / Physical Skills: ADL Cognitive Skills: Attention, Emotional, Energy/Drive, Learn, Memory, Perception, Problem Solve, Safety Awareness, Temperament/Personality, Thought, Understand Psychosocial Skills: Coping Strategies, Environmental  Adaptations, Habits, Interpersonal Interaction, Routines and Behaviors   Visit Diagnosis: Difficulty coping  Frontal lobe and executive function deficit  Severe episode of recurrent major depressive disorder, without psychotic features [facility])    Problem List Patient Active Problem List   Diagnosis Date Noted   Pain of left calf 04/21/2021   Retroperitoneal lymphadenopathy 12/21/2020   Stool incontinence 12/10/2020    Polyarthralgia 12/10/2020   Depression, major, single episode, moderate ([place]) 11/12/2020   Lumbar degenerative disc disease 11/12/2020   Persistent asthma without complication 37/94/3276   Smoker 11/29/2019   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   Insomnia 03/05/2019   COVID-19 12/31/2018   Transaminitis 09/14/2018   Gastro-esophageal reflux 09/13/2018   Benign essential hypertension 14/70/9295   Biliary colic 74/73/4037   Annual physical exam 01/16/2015   Polycystic ovarian syndrome 01/16/2015   Obesity (BMI 35.0-39.9 without comorbidity) 01/16/2015    05/21/2021  Ponciano Ort, MOT, OTR/L  05/21/2021, 8:02 AM  [facility] PARTIAL HOSPITALIZATION PROGRAM [place], [place], [address] Phone: [phone]   Fax:  [phone]  Name: Heather Salazar MRN: [id number] Date of Birth: [birth year]

## 2021-05-24 ENCOUNTER — Telehealth (HOSPITAL_COMMUNITY): Payer: Self-pay | Admitting: Licensed Clinical Social Worker

## 2021-05-24 ENCOUNTER — Other Ambulatory Visit: Payer: Self-pay

## 2021-05-24 ENCOUNTER — Other Ambulatory Visit (HOSPITAL_COMMUNITY): Payer: 59

## 2021-05-25 ENCOUNTER — Encounter (HOSPITAL_COMMUNITY): Payer: Self-pay

## 2021-05-25 ENCOUNTER — Other Ambulatory Visit: Payer: Self-pay

## 2021-05-25 ENCOUNTER — Other Ambulatory Visit (HOSPITAL_COMMUNITY): Payer: 59 | Admitting: Licensed Clinical Social Worker

## 2021-05-25 ENCOUNTER — Other Ambulatory Visit (HOSPITAL_COMMUNITY): Payer: 59 | Admitting: Occupational Therapy

## 2021-05-25 DIAGNOSIS — R41844 Frontal lobe and executive function deficit: Secondary | ICD-10-CM

## 2021-05-25 DIAGNOSIS — R4589 Other symptoms and signs involving emotional state: Secondary | ICD-10-CM

## 2021-05-25 DIAGNOSIS — F332 Major depressive disorder, recurrent severe without psychotic features: Secondary | ICD-10-CM | POA: Diagnosis not present

## 2021-05-25 NOTE — Therapy (Signed)
[place], [place], [address] Phone: [phone]   Fax:  [phone] Virtual Visit via Video Note  I connected with Heather Salazar on 05/25/21 at  12:00 PM EDT by a video enabled telemedicine application and verified that I am speaking with the correct person using two identifiers.  Location: Patient: Patient Home Provider: Clinic Office   I discussed the limitations of evaluation and management by telemedicine and the availability of in person appointments. The patient expressed understanding and agreed to proceed.    I discussed the assessment and treatment plan with the patient. The patient was provided an opportunity to ask questions and all were answered. The patient agreed with the plan and demonstrated an understanding of the instructions.   The patient was advised to call back or seek an in-person evaluation if the symptoms worsen or if the condition fails to improve as anticipated.  I provided 50 minutes of non-face-to-face time during this encounter.   Ponciano Ort, OT   Occupational Therapy Treatment  Patient Details  Name: Heather Salazar MRN: [id number] Date of Birth: 1971-09-24 Referring Provider (OT): Ricky Ala   Encounter Date: 05/25/2021   OT End of Session - 05/25/21 1442     Visit Number 4    Number of Visits 20    Date for OT Re-Evaluation 06/15/21    Authorization Type [employer] No Deduct   Copay 60.00 per visit - collect one copay if multipliable services in one day.   No Co Ins   OOP [phone] met   Visit limit 60- 0 used  PT/OT/ ST/ Chiro/ Pulmonary (if diagnosis is for [facility] no visit limit)   No auth S/w [employer]   Ref#[id number]    OT Start Time 1200    OT Stop Time 1250    OT Time Calculation (min) 50 min    Activity Tolerance Patient tolerated treatment well    Behavior During Therapy WFL for tasks assessed/performed             Past  Medical History:  Diagnosis Date   Anxiety    Arthritis    back    Asthma    childhood   Depression    GERD (gastroesophageal reflux disease)    History of colon polyps    Hypertension    PTSD (post-traumatic stress disorder)     Past Surgical History:  Procedure Laterality Date   CESAREAN SECTION     CHOLECYSTECTOMY N/A 03/06/2018   Procedure: LAPAROSCOPIC CHOLECYSTECTOMY ERAS PATHWAY;  Surgeon: Clovis Riley, MD;  Location: [facility];  Service: General;  Laterality: N/A;   COLONOSCOPY     In [place] [phone] did have a small polyp   tubaligation      There were no vitals filed for this visit.   Subjective Assessment - 05/25/21 1442     Currently in Pain? No/denies                                  OT Education - 05/25/21 1442     Education Details Educated on low vs positive self-esteem and reviewed six strategies to boost low self-esteem    Person(s) Educated Patient    Methods Explanation;Handout    Comprehension Verbalized understanding              OT Short Term Goals - 05/19/21 1451  OT SHORT TERM GOAL #1   Status On-going      OT SHORT TERM GOAL #2   Status On-going      OT SHORT TERM GOAL #3   Status On-going           Group Session:  S: I am smart, passionate, and loving  O: Today's group session encouraged increased engagement and participation through discussion focused on self-esteem. Topic of discussion focused on identifying differences between low vs high self-esteem and identified ways in which our self-esteem impacts our daily lives including: self-criticism, ignoring positive qualities, negative emotions, work/school performance, relationships, leisure engagement, and self-care. Group members further identified ways in which their self-esteem directly impacts their daily function. Tips and strategies were shared to boost and build-up our low self-esteem, including exercise,  mindfulness/meditation, postures of confidence, surrounding yourself with positive people, positive affirmations, and self-care. Group members committed to trying one of the above-mentioned strategies over their weekend.    A: Heather Salazar was active and independent in her participation of discussion and shared that she has low self-esteem and struggles often with feeling validated in her feelings. Pt receptive to strategies reviewed and shared a positive affirmation that stood out to her "I am free to make my own choices." Pt also shared three positive ways to describe herself "smart, passionate, and loving."  P: Continue to attend PHP OT group sessions 5x week for 2 weeks to promote daily structure, social engagement, and opportunities to develop and utilize adaptive strategies to maximize functional performance in preparation for safe transition and integration back into school, work, and the community. Plan to address topic of safety planning in next OT group session.   Plan - 05/25/21 1443     Occupational performance deficits (Please refer to evaluation for details): ADL's;IADL's;Rest and Sleep;Education;Work;Leisure;Social Participation    Body Structure / Function / Physical Skills ADL    Cognitive Skills Attention;Emotional;Energy/Drive;Learn;Memory;Perception;Problem Solve;Safety Awareness;Temperament/Personality;Thought;Understand    Psychosocial Skills Coping Strategies;Environmental  Adaptations;Habits;Interpersonal Interaction;Routines and Behaviors             Patient will benefit from skilled therapeutic intervention in order to improve the following deficits and impairments:   Body Structure / Function / Physical Skills: ADL Cognitive Skills: Attention, Emotional, Energy/Drive, Learn, Memory, Perception, Problem Solve, Safety Awareness, Temperament/Personality, Thought, Understand Psychosocial Skills: Coping Strategies, Environmental  Adaptations, Habits, Interpersonal Interaction,  Routines and Behaviors   Visit Diagnosis: Difficulty coping  Frontal lobe and executive function deficit  Severe episode of recurrent major depressive disorder, without psychotic features [facility])    Problem List Patient Active Problem List   Diagnosis Date Noted   Pain of left calf 04/21/2021   Retroperitoneal lymphadenopathy 12/21/2020   Stool incontinence 12/10/2020   Polyarthralgia 12/10/2020   Depression, major, single episode, moderate (HCC) 11/12/2020   Lumbar degenerative disc disease 11/12/2020   Persistent asthma without complication 63/78/5885   Smoker 11/29/2019   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   Insomnia 03/05/2019   COVID-19 12/31/2018   Transaminitis 09/14/2018   Gastro-esophageal reflux 09/13/2018   Benign essential hypertension 02/77/4128   Biliary colic 78/67/6720   Annual physical exam 01/16/2015   Polycystic ovarian syndrome 01/16/2015   Obesity (BMI 35.0-39.9 without comorbidity) 01/16/2015    05/25/2021  Ponciano Ort, MOT, OTR/L  05/25/2021, 2:43 PM  [place], [place], [address] Phone: [phone]   Fax:  [phone]  Name: Heather Salazar MRN: [id number] Date of Birth: October 17, 1970

## 2021-05-25 NOTE — Progress Notes (Signed)
Spoke with patient via Webex video call, used 2 identifiers to correctly identify patient. States that her daughter showed up over the weekend after getting locked out of her apartment and she had to help get her back in to a place. That caused her a lot of stress and she has not been sleeping well. She did have a job interview yesterday that went well. Denies SI/HI or AV hallucinations. On scale 1-10 as 10 being worst she rates depression at 6 and anxiety at 9. No other issues or complaints. No side effects from medications.

## 2021-05-26 ENCOUNTER — Other Ambulatory Visit (HOSPITAL_COMMUNITY): Payer: 59 | Admitting: Occupational Therapy

## 2021-05-26 ENCOUNTER — Encounter (HOSPITAL_COMMUNITY): Payer: Self-pay

## 2021-05-26 ENCOUNTER — Other Ambulatory Visit (HOSPITAL_COMMUNITY): Payer: 59 | Admitting: Licensed Clinical Social Worker

## 2021-05-26 ENCOUNTER — Other Ambulatory Visit: Payer: Self-pay

## 2021-05-26 DIAGNOSIS — R41844 Frontal lobe and executive function deficit: Secondary | ICD-10-CM

## 2021-05-26 DIAGNOSIS — F332 Major depressive disorder, recurrent severe without psychotic features: Secondary | ICD-10-CM

## 2021-05-26 DIAGNOSIS — R4589 Other symptoms and signs involving emotional state: Secondary | ICD-10-CM

## 2021-05-26 NOTE — Therapy (Signed)
[place], [place], [address] Phone: [phone]   Fax:  [phone] Virtual Visit via Video Note  I connected with Heather Salazar on 05/26/21 at  12:00 PM EDT by a video enabled telemedicine application and verified that I am speaking with the correct person using two identifiers.  Location: Patient: Patient Home Provider: Clinic Office   I discussed the limitations of evaluation and management by telemedicine and the availability of in person appointments. The patient expressed understanding and agreed to proceed.    I discussed the assessment and treatment plan with the patient. The patient was provided an opportunity to ask questions and all were answered. The patient agreed with the plan and demonstrated an understanding of the instructions.   The patient was advised to call back or seek an in-person evaluation if the symptoms worsen or if the condition fails to improve as anticipated.  I provided 45 minutes of non-face-to-face time during this encounter.   Ponciano Ort, OT   Occupational Therapy Treatment  Patient Details  Name: Heather Salazar MRN: [id number] Date of Birth: 21-Jul-1971 Referring Provider (OT): Ricky Ala   Encounter Date: 05/26/2021   OT End of Session - 05/26/21 1435     Visit Number 5    Number of Visits 20    Date for OT Re-Evaluation 06/15/21    Authorization Type [employer] No Deduct   Copay 60.00 per visit - collect one copay if multipliable services in one day.   No Co Ins   OOP [phone] met   Visit limit 60- 0 used  PT/OT/ ST/ Chiro/ Pulmonary (if diagnosis is for [facility] Behavioral Health no visit limit)   No auth S/w [employer]   Ref#[id number]    OT Start Time 1205    OT Stop Time 1250    OT Time Calculation (min) 45 min    Activity Tolerance Patient tolerated treatment well    Behavior During Therapy WFL for tasks assessed/performed             Past  Medical History:  Diagnosis Date   Anxiety    Arthritis    back    Asthma    childhood   Depression    GERD (gastroesophageal reflux disease)    History of colon polyps    Hypertension    PTSD (post-traumatic stress disorder)     Past Surgical History:  Procedure Laterality Date   CESAREAN SECTION     CHOLECYSTECTOMY N/A 03/06/2018   Procedure: LAPAROSCOPIC CHOLECYSTECTOMY ERAS PATHWAY;  Surgeon: Clovis Riley, MD;  Location: [facility];  Service: General;  Laterality: N/A;   COLONOSCOPY     In [place] [phone] did have a small polyp   tubaligation      There were no vitals filed for this visit.   Subjective Assessment - 05/26/21 1435     Currently in Pain? No/denies                                  OT Education - 05/26/21 1435     Education Details Educated on identifying coping strategies, social supports, and community mental health resources available through use of Database administrator) Educated Patient    Methods Explanation;Handout    Comprehension Verbalized understanding              OT Short Term Goals -  05/19/21 1451       OT SHORT TERM GOAL #1   Status On-going      OT SHORT TERM GOAL #2   Status On-going      OT SHORT TERM GOAL #3   Status On-going           Group Session:  S: "I like [place] near my [place] to relax and give my brain space."  O: Today's group discussion focused on the topic of Safety Planning. Patients were educated on what a safety plan is, what it can be used for, and why we should create one. Group then worked collaboratively and independently to create an individualized safety plan including identifying warning signs, coping strategies, places to go for distraction, social supports, professional supports, and how to make the environment safe. Group members were also encouraged to reflect on the question "What is life worth living for?" Group session  ended with patients encouraged to utilize their safety plan as an all-inclusive resource when experiencing a mental health crisis.    A: Heather Salazar was active in her participation of discussion and activity, sharing that her current go-to coping skill right now is sleeping. Pt identified benefit from distractions including watching calming TV shows and going to the lake/[place]ier for distraction. Appeared receptive to further education on safety planning.   P: Continue to attend PHP OT group sessions 5x week for 2 weeks to promote daily structure, social engagement, and opportunities to develop and utilize adaptive strategies to maximize functional performance in preparation for safe transition and integration back into school, work, and the community.   Plan - 05/26/21 1435     Occupational performance deficits (Please refer to evaluation for details): ADL's;IADL's;Rest and Sleep;Education;Work;Leisure;Social Participation    Body Structure / Function / Physical Skills ADL    Cognitive Skills Attention;Emotional;Energy/Drive;Learn;Memory;Perception;Problem Solve;Safety Awareness;Temperament/Personality;Thought;Understand    Psychosocial Skills Coping Strategies;Environmental  Adaptations;Habits;Interpersonal Interaction;Routines and Behaviors             Patient will benefit from skilled therapeutic intervention in order to improve the following deficits and impairments:   Body Structure / Function / Physical Skills: ADL Cognitive Skills: Attention, Emotional, Energy/Drive, Learn, Memory, Perception, Problem Solve, Safety Awareness, Temperament/Personality, Thought, Understand Psychosocial Skills: Coping Strategies, Environmental  Adaptations, Habits, Interpersonal Interaction, Routines and Behaviors   Visit Diagnosis: Difficulty coping  Frontal lobe and executive function deficit  Severe episode of recurrent major depressive disorder, without psychotic features [facility])    Problem  List Patient Active Problem List   Diagnosis Date Noted   Pain of left calf 04/21/2021   Retroperitoneal lymphadenopathy 12/21/2020   Stool incontinence 12/10/2020   Polyarthralgia 12/10/2020   Depression, major, single episode, moderate (HCC) 11/12/2020   Lumbar degenerative disc disease 11/12/2020   Persistent asthma without complication 84/16/6063   Smoker 11/29/2019   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   Insomnia 03/05/2019   COVID-19 12/31/2018   Transaminitis 09/14/2018   Gastro-esophageal reflux 09/13/2018   Benign essential hypertension 01/60/1093   Biliary colic 23/55/7322   Annual physical exam 01/16/2015   Polycystic ovarian syndrome 01/16/2015   Obesity (BMI 35.0-39.9 without comorbidity) 01/16/2015    05/26/2021  Ponciano Ort, MOT, OTR/L 05/26/2021, 2:36 PM  [place], [place], [address] Phone: [phone]   Fax:  [phone]  Name: Heather Salazar MRN: [id number] Date of Birth: 29-Feb-1971

## 2021-05-27 ENCOUNTER — Other Ambulatory Visit: Payer: Self-pay

## 2021-05-27 ENCOUNTER — Other Ambulatory Visit (HOSPITAL_COMMUNITY): Payer: 59 | Admitting: Licensed Clinical Social Worker

## 2021-05-27 ENCOUNTER — Other Ambulatory Visit (HOSPITAL_COMMUNITY): Payer: 59

## 2021-05-27 DIAGNOSIS — F332 Major depressive disorder, recurrent severe without psychotic features: Secondary | ICD-10-CM

## 2021-05-28 ENCOUNTER — Other Ambulatory Visit (HOSPITAL_COMMUNITY): Payer: 59 | Admitting: Licensed Clinical Social Worker

## 2021-05-28 ENCOUNTER — Other Ambulatory Visit: Payer: Self-pay

## 2021-05-28 ENCOUNTER — Other Ambulatory Visit (HOSPITAL_COMMUNITY): Payer: 59

## 2021-05-28 DIAGNOSIS — F332 Major depressive disorder, recurrent severe without psychotic features: Secondary | ICD-10-CM | POA: Diagnosis not present

## 2021-05-31 ENCOUNTER — Other Ambulatory Visit (HOSPITAL_COMMUNITY): Payer: 59 | Admitting: Licensed Clinical Social Worker

## 2021-05-31 ENCOUNTER — Other Ambulatory Visit: Payer: Self-pay

## 2021-05-31 ENCOUNTER — Other Ambulatory Visit (HOSPITAL_COMMUNITY): Payer: 59

## 2021-05-31 DIAGNOSIS — F332 Major depressive disorder, recurrent severe without psychotic features: Secondary | ICD-10-CM | POA: Diagnosis not present

## 2021-06-01 ENCOUNTER — Other Ambulatory Visit (HOSPITAL_COMMUNITY): Payer: 59

## 2021-06-01 ENCOUNTER — Other Ambulatory Visit (HOSPITAL_COMMUNITY): Payer: 59 | Admitting: Licensed Clinical Social Worker

## 2021-06-01 ENCOUNTER — Other Ambulatory Visit: Payer: Self-pay

## 2021-06-01 ENCOUNTER — Ambulatory Visit: Payer: 59 | Admitting: Professional

## 2021-06-01 DIAGNOSIS — F332 Major depressive disorder, recurrent severe without psychotic features: Secondary | ICD-10-CM

## 2021-06-01 NOTE — Progress Notes (Signed)
Virtual Visit via Video Note  I connected with Heather Salazar on 06/01/21 at  9:00 AM EDT by a video enabled telemedicine application and verified that I am speaking with the correct person using two identifiers.  At orientation to the [facility] program, Case Manager discussed the limitations of evaluation and management by telemedicine and the availability of in person appointments. The patient expressed understanding and agreed to proceed with virtual visits throughout the duration of the program.   Location:  Patient: Patient Home Provider: Home Office   History of Present Illness: MDD  Observations/Objective: Check In: Case Manager checked in with all participants to review discharge dates, insurance authorizations, work-related documents and needs from the treatment team regarding medications. Client stated needs and engaged in discussion.   Initial Therapeutic Activity: Counselor facilitated a check-in with group members to assess mood and current functioning. Client shared details of their mental health management since our last session, including challenges and successes. Counselor engaged group in discussion, covering the following topics: [employer], [place], boundaries, assertive communication, relational dynamics and traumas and self-care. Client presents with severe depression and severe anxiety. Client denied any current SI/HI/psychosis.  Second Therapeutic Activity: Counselor introduced [employer], [place] Chaplain to present information and discussion on Grief and Loss. Group members engaged in discussion, sharing how grief impacts them, what comforts them, what emotions are felt, labeling losses, etc. After guest speaker logged off, Counselor prompted group to spend 10-15 minutes journaling to process personal grief and loss situations. Counselor processed entries with group and client's identified areas for additional processing in individual therapy and local support  groups. Client engaged well in discussion.  Check Out: Counselor prompted group members to identify one self-care practice or productivity activity they would like to engage in this today. Counselor encouraged Client to be kind and compassionate with self, as grief and loss usually will bring up residual thoughts and feelings. Client endorsed safety plan to be followed to prevent safety issues.  Assessment and Plan: Clinician recommends that Client remain in PHP treatment to better manage mental health symptoms, stabilization and to address treatment plan goals. Clinician recommends adherence to crisis/safety plan, taking medications as prescribed, and following up with medical professionals if any issues arise.    Follow Up Instructions: Clinician will send Webex link for next session. The Client was advised to call back or seek an in-person evaluation if the symptoms worsen or if the condition fails to improve as anticipated.     I provided 180 minutes of non-face-to-face time during this encounter.     Lise Auer, LCSW

## 2021-06-02 ENCOUNTER — Encounter (HOSPITAL_COMMUNITY): Payer: Self-pay

## 2021-06-02 ENCOUNTER — Other Ambulatory Visit (HOSPITAL_COMMUNITY): Payer: 59

## 2021-06-02 ENCOUNTER — Telehealth (HOSPITAL_COMMUNITY): Payer: Self-pay | Admitting: Licensed Clinical Social Worker

## 2021-06-02 ENCOUNTER — Other Ambulatory Visit: Payer: Self-pay

## 2021-06-03 ENCOUNTER — Other Ambulatory Visit (HOSPITAL_COMMUNITY): Payer: 59 | Admitting: Licensed Clinical Social Worker

## 2021-06-03 ENCOUNTER — Other Ambulatory Visit (HOSPITAL_COMMUNITY): Payer: 59 | Admitting: Occupational Therapy

## 2021-06-03 ENCOUNTER — Other Ambulatory Visit: Payer: Self-pay

## 2021-06-03 ENCOUNTER — Encounter (HOSPITAL_COMMUNITY): Payer: Self-pay

## 2021-06-03 ENCOUNTER — Inpatient Hospital Stay: Payer: Managed Care, Other (non HMO) | Admitting: Hematology & Oncology

## 2021-06-03 DIAGNOSIS — F321 Major depressive disorder, single episode, moderate: Secondary | ICD-10-CM

## 2021-06-03 DIAGNOSIS — F332 Major depressive disorder, recurrent severe without psychotic features: Secondary | ICD-10-CM

## 2021-06-03 DIAGNOSIS — R4589 Other symptoms and signs involving emotional state: Secondary | ICD-10-CM

## 2021-06-03 DIAGNOSIS — R41844 Frontal lobe and executive function deficit: Secondary | ICD-10-CM

## 2021-06-03 NOTE — Progress Notes (Signed)
Virtual Visit via Telephone Note  I connected with Heather Salazar on 06/03/21 at  9:00 AM EDT by telephone and verified that I am speaking with the correct person using two identifiers.  Location: Patient: Home Provider: Office   I discussed the limitations, risks, security and privacy concerns of performing an evaluation and management service by telephone and the availability of in person appointments. I also discussed with the patient that there may be a patient responsible charge related to this service. The patient expressed understanding and agreed to proceed.    I discussed the assessment and treatment plan with the patient. The patient was provided an opportunity to ask questions and all were answered. The patient agreed with the plan and demonstrated an understanding of the instructions.   The patient was advised to call back or seek an in-person evaluation if the symptoms worsen or if the condition fails to improve as anticipated.  I provided 15 minutes of non-face-to-face time during this encounter.   Derrill Center, NP   [facility] Partial Hospitalization Outpatient Program Discharge Summary  Heather Salazar [id number]  Admission date: 05/18/2021 Discharge date: 06/04/2021  Reason for admission: Per admission assessment note: Heather Salazar is a 50 y.o. African American female presents with depression and passive suicidal ideations. Patient presents with multiple stressors. Stated that her 97 year old daughter recently attempted suicide. Stated " I know that she was struggling in her relationship."  Reported  " I know that she has a hard time asking for help, she gets that from me."  [name] stated she is currently followed by primary care provider where she is prescribed Wellbutrin, Cymbalta and Ambien for sleep.  However reports Ambien has not been effective with her reported sleepiness.  Discussed initiating hydroxyzine 25 to 50 mg for sedation.   Patient was receptive to plan.  She reports previous inpatient admissions states her most recent admission was 1 year prior.  Patient was enrolled in partial psychiatric program on 05/18/21.   Progress in Program Toward Treatment Goals:  Ongoing, patient attended and participated with daily group session with active and engaged participation.  Denying suicidal or homicidal ideations.  Does report ongoing panic attacks and is requesting medication refill with Ativan.  Patient to follow-up with primary care for medication refills as she has declined intensive outpatient programming.   Progress (rationale): Keep follow-up with all outpatient providers.  Current behavioral therapist Juliann Pulse.   Take all medications as prescribed. Keep all follow-up appointments as scheduled.  Do not consume alcohol or use illegal drugs while on prescription medications. Report any adverse effects from your medications to your primary care provider promptly.  In the event of recurrent symptoms or worsening symptoms, call 911, a crisis hotline, or go to the nearest emergency department for evaluation.    Derrill Center, NP 06/03/2021

## 2021-06-03 NOTE — Progress Notes (Signed)
Spoke with patient via Webex video call, used 2 identifiers to correctly identify patient. States that groups are going good. She is hoping to do IOP after PHP ends. She will discuss it today with the counselor. She would like refill of her Ativan. Message sent to NP to discuss. Sleep is good. Denies SI/HI or AV hallucinations. On scale 1-10 as 10 being worst she rates depression at 7 and anxiety at 6. PHQ9=11. No issue or complaints. No side effects from medications.

## 2021-06-03 NOTE — Therapy (Signed)
[place], [place], [address] Phone: [phone]   Fax:  [phone] Virtual Visit via Video Note  I connected with [name] on 06/03/21 at  11:00 AM EDT by a video enabled telemedicine application and verified that I am speaking with the correct person using two identifiers.  Location: Patient: Patient Home Provider: Clinic Office   I discussed the limitations of evaluation and management by telemedicine and the availability of in person appointments. The patient expressed understanding and agreed to proceed.   I discussed the assessment and treatment plan with the patient. The patient was provided an opportunity to ask questions and all were answered. The patient agreed with the plan and demonstrated an understanding of the instructions.   The patient was advised to call back or seek an in-person evaluation if the symptoms worsen or if the condition fails to improve as anticipated.  I provided 60 minutes of non-face-to-face time during this encounter.   Ponciano Ort, OT   Occupational Therapy Treatment  Patient Details  Name: Heather Salazar MRN: [id number] Date of Birth: Feb 18, 1971 Referring Provider (OT): Ricky Ala   Encounter Date: 06/03/2021   OT End of Session - 06/03/21 1223     Visit Number 6    Number of Visits 20    Date for OT Re-Evaluation 06/15/21    Authorization Type [employer] No Deduct   Copay 60.00 per visit - collect one copay if multipliable services in one day.   No Co Ins   OOP 2500-245.76 met   Visit limit 60- 0 used  PT/OT/ ST/ Chiro/ Pulmonary (if diagnosis is for [facility] Behavioral Health no visit limit)   No auth S/w [place]   Ref#[id number]    OT Start Time 1105    OT Stop Time 1205    OT Time Calculation (min) 60 min    Activity Tolerance Patient tolerated treatment well    Behavior During Therapy WFL for tasks assessed/performed             Past  Medical History:  Diagnosis Date   Anxiety    Arthritis    back    Asthma    childhood   Depression    GERD (gastroesophageal reflux disease)    History of colon polyps    Hypertension    PTSD (post-traumatic stress disorder)     Past Surgical History:  Procedure Laterality Date   CESAREAN SECTION     CHOLECYSTECTOMY N/A 03/06/2018   Procedure: LAPAROSCOPIC CHOLECYSTECTOMY ERAS PATHWAY;  Surgeon: Clovis Riley, MD;  Location: [facility];  Service: General;  Laterality: N/A;   COLONOSCOPY     In [place] [phone] did have a small polyp   tubaligation      There were no vitals filed for this visit.   Subjective Assessment - 06/03/21 1223     Currently in Pain? No/denies                                  OT Education - 06/03/21 1223     Education Details Educated on concept of sensory modulation and self-soothing as coping strategies through use of the eight senses    Person(s) Educated Patient    Methods Explanation;Handout    Comprehension Verbalized understanding              OT Short Term Goals - 05/19/21 1451  OT SHORT TERM GOAL #1   Status On-going      OT SHORT TERM GOAL #2   Status On-going      OT SHORT TERM GOAL #3   Status On-going           Group Session:  S: "Being at the beach is very relaxing to me, hearing the sound of the waves"  O: Today's group session focused on topic of sensory modulation and self-soothing through use of the 8 senses. Discussion introduced the concept of sensory modulation and integration, focusing on how we can utilize our body and it's senses to self-soothe or cope, when we are experiencing an over or under-whelming sensation or feeling. Group members were introduced to a sensory diet checklist as a helpful tool/resource that can be utilized to identify what activities and strategies we prefer and do not prefer based upon our response to different stimulus. The  concept of alerting vs calming activities was also introduced to understand how to counteract how we are feeling (Example: when we are feeling overwhelmed/stressed, engage in something calming. When we are feeling depressed/low energy, engage in something alerting). Group members engaged actively in discussion sharing their own personal sensory likes/dislikes.     A:  Taneil was active and independent in her participation of discussion and activity, sharing several different activities that she finds both alerting and calming as ways to self soothe. Pt identified calming activities for her are "going to the beach and rocking myself." Pt identified alerting activities for her are "listening to music." Appeared open and receptive to additional strategies/suggestions and peer feedback.  P: Continue to attend PHP OT group sessions 5x week for the remainder of the week to promote daily structure, social engagement, and opportunities to develop and utilize adaptive strategies to maximize functional performance in preparation for safe transition and integration back into school, work, and the community. Plan to address topic of financial management/budgeting in next OT group session.    Plan - 06/03/21 1223     Occupational performance deficits (Please refer to evaluation for details): ADL's;IADL's;Rest and Sleep;Education;Work;Leisure;Social Participation    Body Structure / Function / Physical Skills ADL    Cognitive Skills Attention;Emotional;Energy/Drive;Learn;Memory;Perception;Problem Solve;Safety Awareness;Temperament/Personality;Thought;Understand    Psychosocial Skills Coping Strategies;Environmental  Adaptations;Habits;Interpersonal Interaction;Routines and Behaviors             Patient will benefit from skilled therapeutic intervention in order to improve the following deficits and impairments:   Body Structure / Function / Physical Skills: ADL Cognitive Skills: Attention, Emotional,  Energy/Drive, Learn, Memory, Perception, Problem Solve, Safety Awareness, Temperament/Personality, Thought, Understand Psychosocial Skills: Coping Strategies, Environmental  Adaptations, Habits, Interpersonal Interaction, Routines and Behaviors   Visit Diagnosis: Difficulty coping  Frontal lobe and executive function deficit  Severe episode of recurrent major depressive disorder, without psychotic features [facility])    Problem List Patient Active Problem List   Diagnosis Date Noted   Pain of left calf 04/21/2021   Retroperitoneal lymphadenopathy 12/21/2020   Stool incontinence 12/10/2020   Polyarthralgia 12/10/2020   Depression, major, single episode, moderate ([place]) 11/12/2020   Lumbar degenerative disc disease 11/12/2020   Persistent asthma without complication 30/13/1438   Smoker 11/29/2019   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   Insomnia 03/05/2019   COVID-19 12/31/2018   Transaminitis 09/14/2018   Gastro-esophageal reflux 09/13/2018   Benign essential hypertension 88/75/7972   Biliary colic 82/03/155   Annual physical exam 01/16/2015   Polycystic ovarian syndrome 01/16/2015   Obesity (BMI 35.0-39.9 without comorbidity) 01/16/2015  06/03/2021  Ponciano Ort, MOT, OTR/L  06/03/2021, 12:24 PM  [facility] HOSPITALIZATION PROGRAM [place], [place], [address] Phone: [phone]   Fax:  [phone]  Name: Kalani Baray MRN: [id number] Date of Birth: 1971-07-19

## 2021-06-04 ENCOUNTER — Other Ambulatory Visit (HOSPITAL_COMMUNITY): Payer: 59 | Admitting: Occupational Therapy

## 2021-06-04 ENCOUNTER — Encounter (HOSPITAL_COMMUNITY): Payer: Self-pay

## 2021-06-04 ENCOUNTER — Other Ambulatory Visit (HOSPITAL_COMMUNITY): Payer: 59 | Admitting: Licensed Clinical Social Worker

## 2021-06-04 ENCOUNTER — Other Ambulatory Visit: Payer: Self-pay

## 2021-06-04 DIAGNOSIS — R41844 Frontal lobe and executive function deficit: Secondary | ICD-10-CM

## 2021-06-04 DIAGNOSIS — F332 Major depressive disorder, recurrent severe without psychotic features: Secondary | ICD-10-CM | POA: Diagnosis not present

## 2021-06-04 DIAGNOSIS — R4589 Other symptoms and signs involving emotional state: Secondary | ICD-10-CM

## 2021-06-04 MED ORDER — LORAZEPAM 0.5 MG PO TABS: 0.5000 mg | ORAL_TABLET | Freq: Three times a day (TID) | ORAL | 0 refills | Status: DC | PRN

## 2021-06-04 NOTE — Therapy (Signed)
[place], [place], [address] Phone: [phone]   Fax:  [phone] Virtual Visit via Video Note  I connected with Heather Salazar on 06/04/21 at  11:00 AM EDT by a video enabled telemedicine application and verified that I am speaking with the correct person using two identifiers.  Location: Patient: Patient Home Provider: Clinic Office   I discussed the limitations of evaluation and management by telemedicine and the availability of in person appointments. The patient expressed understanding and agreed to proceed.   I discussed the assessment and treatment plan with the patient. The patient was provided an opportunity to ask questions and all were answered. The patient agreed with the plan and demonstrated an understanding of the instructions.   The patient was advised to call back or seek an in-person evaluation if the symptoms worsen or if the condition fails to improve as anticipated.  I provided 60 minutes of non-face-to-face time during this encounter.   Heather Salazar, OT   Occupational Therapy Treatment  Patient Details  Name: Heather Salazar MRN: [id number] Date of Birth: 10-18-1970 Referring Provider (OT): Heather Salazar   Encounter Date: 06/04/2021   OT End of Session - 06/04/21 1442     Visit Number 7    Number of Visits 20    Date for OT Re-Evaluation 06/15/21    Authorization Type [employer] No Deduct   Copay 60.00 per visit - collect one copay if multipliable services in one day.   No Co Ins   OOP [phone] met   Visit limit 60- 0 used  PT/OT/ ST/ Chiro/ Pulmonary (if diagnosis is for Autism Behavioral Health no visit limit)   No auth S/w Heather Salazar   Ref#[id number]    OT Start Time 1100    OT Stop Time 1200    OT Time Calculation (min) 60 min    Activity Tolerance Patient tolerated treatment well    Behavior During Therapy WFL for tasks assessed/performed             Past  Medical History:  Diagnosis Date   Anxiety    Arthritis    back    Asthma    childhood   Depression    GERD (gastroesophageal reflux disease)    History of colon polyps    Hypertension    PTSD (post-traumatic stress disorder)     Past Surgical History:  Procedure Laterality Date   CESAREAN SECTION     CHOLECYSTECTOMY N/A 03/06/2018   Procedure: LAPAROSCOPIC CHOLECYSTECTOMY ERAS PATHWAY;  Surgeon: Heather Riley, MD;  Location: [facility];  Service: General;  Laterality: N/A;   COLONOSCOPY     In [place] [phone] did have a small polyp   tubaligation      There were no vitals filed for this visit.   Subjective Assessment - 06/04/21 1442     Currently in Pain? No/denies             OT Education - 06/04/21 1442     Education Details Educated on Presenter, broadcasting) Educated Patient    Methods Explanation;Handout    Comprehension Verbalized understanding              OT Short Term Goals - 06/04/21 1443       OT SHORT TERM GOAL #1   Title Pt will actively engage in OT group sessions throughout duration of PHP programming, in order to promote daily  structure, social engagement, and opportunities to develop and utilize adaptive strategies to maximize functional performance in preparation for safe transition and integration back into school, work, and the community.    Status Achieved      OT SHORT TERM GOAL #2   Title Pt will identify and implement 1-3 sleep hygiene strategies she can utilize, in order to improve sleep quality/ADL performance, in preparation for safe and healthy reintegration back into the community at discharge.    Status Partially Met      OT SHORT TERM GOAL #3   Title Pt will identify 1-3 stress management strategies she can utilize, in order to safely manage increased psychosocial stressors identified, with min cues, in preparation for safe transition back to the community at discharge.     Status Achieved           Group Session:  S: None noted*  O: Group encouraged increased engagement and participation through discussion focused on financial management and budgeting. Group members shared their current budgeting strategies, if any, and shared if they are satisfied with their strategies or had room for growth/improvement. Education and additional strategies were provided including use of several budgeting tools and saving techniques.   A: Heather Salazar was minimally engaged in today's session, despite max verbal cues to engage, however did express difficulties with finances. Unable to determine if pt was receptive to education/strategies provided, as pt camera was turned off.   Heather Salazar will discharge from [facility] program, effective today 06/04/2021 with plan to follow-up and begin [facility] on Monday 06/07/2021 for on-going therapy and treatment needs.   OCCUPATIONAL THERAPY DISCHARGE SUMMARY  Visits from Start of Care: 7  Current functional level related to goals / functional outcomes: Heather Salazar has met 2/3 OT goals during her time in the [facility] program, however continues to work on her sleep hygiene/quality. Pt is ready/agreeable to discharge at this time with a plan to follow up and begin [place] IOP on Monday 06/07/2021 for ongoing therapy and treatment needs.    Remaining deficits: See above    Education / Equipment: See above    Patient agrees to discharge. Patient goals were partially met. Patient is being discharged due to meeting the stated rehab goals..     Plan - 06/04/21 1442     Occupational performance deficits (Please refer to evaluation for details): ADL's;IADL's;Rest and Sleep;Education;Work;Leisure;Social Participation    Body Structure / Function / Physical Skills ADL    Cognitive Skills Attention;Emotional;Energy/Drive;Learn;Memory;Perception;Problem Solve;Safety Awareness;Temperament/Personality;Thought;Understand    Psychosocial Skills Coping  Strategies;Environmental  Adaptations;Habits;Interpersonal Interaction;Routines and Behaviors             Patient will benefit from skilled therapeutic intervention in order to improve the following deficits and impairments:   Body Structure / Function / Physical Skills: ADL Cognitive Skills: Attention, Emotional, Energy/Drive, Learn, Memory, Perception, Problem Solve, Safety Awareness, Temperament/Personality, Thought, Understand Psychosocial Skills: Coping Strategies, Environmental  Adaptations, Habits, Interpersonal Interaction, Routines and Behaviors   Visit Diagnosis: Difficulty coping  Frontal lobe and executive function deficit  Severe episode of recurrent major depressive disorder, without psychotic features [facility])    Problem List Patient Active Problem List   Diagnosis Date Noted   Pain of left calf 04/21/2021   Retroperitoneal lymphadenopathy 12/21/2020   Stool incontinence 12/10/2020   Polyarthralgia 12/10/2020   Depression, major, single episode, moderate ([place]) 11/12/2020   Lumbar degenerative disc disease 11/12/2020   Persistent asthma without complication 40/07/2724   Smoker 11/29/2019   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   Insomnia  03/05/2019   COVID-19 12/31/2018   Transaminitis 09/14/2018   Gastro-esophageal reflux 09/13/2018   Benign essential hypertension 78/93/8101   Biliary colic 75/07/2584   Annual physical exam 01/16/2015   Polycystic ovarian syndrome 01/16/2015   Obesity (BMI 35.0-39.9 without comorbidity) 01/16/2015    06/04/2021  Heather Salazar, MOT, OTR/L  06/04/2021, 2:43 PM  [facility] PARTIAL HOSPITALIZATION PROGRAM [place], [place], [address] Phone: [phone]   Fax:  [phone]  Name: Heather Salazar MRN: [id number] Date of Birth: [birth year]

## 2021-06-04 NOTE — Progress Notes (Signed)
Ativan 0.40m  was refilled.  Patient was encouraged to utilize  hydroxyzine 25 mg po PRN and coping skills. Consider initiating  Buspar 5 mg po BID

## 2021-06-07 ENCOUNTER — Encounter (HOSPITAL_COMMUNITY): Payer: Self-pay

## 2021-06-07 ENCOUNTER — Encounter: Payer: Self-pay | Admitting: *Deleted

## 2021-06-07 ENCOUNTER — Other Ambulatory Visit (HOSPITAL_COMMUNITY): Payer: 59 | Admitting: Psychiatry

## 2021-06-07 ENCOUNTER — Other Ambulatory Visit: Payer: Self-pay

## 2021-06-07 DIAGNOSIS — F332 Major depressive disorder, recurrent severe without psychotic features: Secondary | ICD-10-CM | POA: Diagnosis not present

## 2021-06-07 NOTE — Progress Notes (Addendum)
Virtual Visit via Video Note  I connected with Heather Salazar on @TODAY @ at  9:00 AM EDT by a video enabled telemedicine application and verified that I am speaking with the correct person using two identifiers.  Location: Patient: at home Provider: at office   I discussed the limitations of evaluation and management by telemedicine and the availability of in person appointments. The patient expressed understanding and agreed to proceed.  I discussed the assessment and treatment plan with the patient. The patient was provided an opportunity to ask questions and all were answered. The patient agreed with the plan and demonstrated an understanding of the instructions.   The patient was advised to call back or seek an in-person evaluation if the symptoms worsen or if the condition fails to improve as anticipated.  I provided 20 minutes of non-face-to-face time during this encounter.   Dellia Nims, M.Ed,CNA   Patient ID: Heather Salazar, female   DOB: [birth year], 50 y.o.   MRN: [id number] As per previous assessment note by Ricky Ala, NP states: Heather Salazar is a 50 y.o. African American female presents with depression and passive suicidal ideations. Patient presents with multiple stressors. Stated that her 15 year old daughter recently attempted suicide. Stated " I know that she was struggling in her relationship."  Reported  " I know that she has a hard time asking for help, she gets that from me."  Heather Salazar stated she is currently followed by primary care provider where she is prescribed Wellbutrin, Cymbalta and Ambien for sleep.  However reports Ambien has not been effective with her reported sleepiness.  Discussed initiating hydroxyzine 25 to 50 mg for sedation.  Patient was receptive to plan.  She reports previous inpatient admissions states her most recent admission was 1 year prior.  Patient was enrolled in partial psychiatric program on 05/18/21-06-04-21.   Primary complaints  include: agitation, anxiety, depression worse, difficulty sleeping, feeling depressed, and feeling suicidal.  Onset of symptoms was gradual with gradually worsening course since that time. Psychosocial Stressors include the following: family and financial.  Reports drinking alcohol occasionally.  Denies illicit drug use.  States her anxiety comes from COVID related symptoms.  States she has been COVID-positive for the past 3 times.  States her job is stressful as she works for [employer].  Reports a good relationship between she and her children.  States her youngest resides in [place].  Denies any other reported family history.   Pt started in [place] today; pt transitioned from [facility].  Pt states that she's been enjoying the groups.  Reports improved sleep.  Denies SI/HI or A/V hallucinations.  On a scale of 1-10 (10 being the worst) pt rates her depression at a 7 and anxiety at a 6. A:  Oriented pt to MH-IOP.  Encouraged pt to contact her insurance co to verify her benefits.  Pt gave verbal consent for treatment, to release chart information to referred providers and to complete any forms if needed.  Pt also gave consent for attending group virtually d/t COVID-19 social distancing restrictions.  Encouraged support groups through [facility].  Will refer pt to a psychiatrist (or back to PCP) and pt to f/u with Francie Massing, [facility] upon discharge.  R:  Pt receptive.    Dellia Nims, M.Ed,CNA

## 2021-06-07 NOTE — Progress Notes (Signed)
Patient was unable to make her last scheduled appointment. Rescheduled for 07/22/2021.  Oncology Nurse Navigator Documentation  Oncology Nurse Navigator Flowsheets 06/07/2021  Abnormal Finding Date -  Diagnosis Status -  Navigator Follow Up Date: 07/22/2021  Navigator Follow Up Reason: Follow-up Appointment  Navigator Location [facility]  Navigator Encounter Type Appt/Treatment Plan Review  Telephone -  Patient Visit Type MedOnc  Treatment Phase Abnormal Scans  Barriers/Navigation Needs Coordination of Care  Education -  Interventions None Required  Acuity Level 2-Minimal Needs (1-2 Barriers Identified)  Coordination of Care -  Education Method -  Support Groups/Services Friends and Family  Time Spent with Patient 15

## 2021-06-07 NOTE — Progress Notes (Signed)
Virtual Visit via Video Note  I connected with Heather Salazar on 06/07/21 at  9:00 AM EDT by a video enabled telemedicine application and verified that I am speaking with the correct person using two identifiers.  At orientation to the IOP program, Case Manager discussed the limitations of evaluation and management by telemedicine and the availability of in person appointments. The patient expressed understanding and agreed to proceed with virtual visits throughout the duration of the program.   Location:  Patient: Patient Home Provider: Home Office   History of Present Illness: MDD  Observations/Objective: Check In: Case Manager checked in with all participants to review discharge dates, insurance authorizations, work-related documents and needs from the treatment team regarding medications. Client stated needs and engaged in discussion. Case Manager introduced a new group member, with group welcoming and starting the joining process.  Initial Therapeutic Activity: Counselor facilitated a check-in with group members to assess mood and current functioning. Client shared details of their mental health management since our last session, including challenges and successes. Counselor engaged group in discussion, covering the following topics: celebrating successes, exploring new activities, volunteering, setting boundaries in the work place, and applying self-love practices. Client presents with moderate depression and moderate anxiety. Client denied any current SI/HI/psychosis.  Second Therapeutic Activity: Counselor engaged group in an activity where we explored unrealistic expectations, associated with the cognitive distortion called "should-ing". Counselor provided psychoeducation on the concept and engaged group in discussion after they privately listed their unrealistic expectations. Group members were empathetic with each other remarks giving examples of how this distorted thinking impacts  their lives and mental health. Counselor shared 5 strategies highlighted in a therapeutic video on the topic and discussed on how to make practices applicable to each members lives. Client engaged well in activity and discussion.   Check Out: Counselor prompted group members to identify one self-care practice or productivity activity they would like to engage in today. Client plans to take a nap, as her sleep has been lacking past 2 nights. Client endorsed safety plan to be followed to prevent safety issues.  Assessment and Plan: Clinician recommends that Client remain in IOP treatment to better manage mental health symptoms, stabilization and to address treatment plan goals. Clinician recommends adherence to crisis/safety plan, taking medications as prescribed, and following up with medical professionals if any issues arise.    Follow Up Instructions: Clinician will send Webex link for next session. The Client was advised to call back or seek an in-person evaluation if the symptoms worsen or if the condition fails to improve as anticipated.     I provided 180 minutes of non-face-to-face time during this encounter.     Lise Auer, LCSW

## 2021-06-08 ENCOUNTER — Other Ambulatory Visit: Payer: Self-pay

## 2021-06-08 ENCOUNTER — Encounter (HOSPITAL_COMMUNITY): Payer: Self-pay

## 2021-06-08 ENCOUNTER — Encounter: Payer: Self-pay | Admitting: Sports Medicine

## 2021-06-08 ENCOUNTER — Other Ambulatory Visit (HOSPITAL_COMMUNITY): Payer: 59 | Admitting: Family

## 2021-06-08 ENCOUNTER — Telehealth: Payer: Self-pay

## 2021-06-08 ENCOUNTER — Ambulatory Visit (INDEPENDENT_AMBULATORY_CARE_PROVIDER_SITE_OTHER): Payer: Managed Care, Other (non HMO) | Admitting: Sports Medicine

## 2021-06-08 DIAGNOSIS — F332 Major depressive disorder, recurrent severe without psychotic features: Secondary | ICD-10-CM | POA: Diagnosis not present

## 2021-06-08 DIAGNOSIS — M5136 Other intervertebral disc degeneration, lumbar region: Secondary | ICD-10-CM | POA: Diagnosis not present

## 2021-06-08 DIAGNOSIS — M51369 Other intervertebral disc degeneration, lumbar region without mention of lumbar back pain or lower extremity pain: Secondary | ICD-10-CM

## 2021-06-08 DIAGNOSIS — F321 Major depressive disorder, single episode, moderate: Secondary | ICD-10-CM

## 2021-06-08 MED ORDER — PREDNISONE 50 MG PO TABS: ORAL_TABLET | ORAL | 0 refills | Status: DC

## 2021-06-08 NOTE — Telephone Encounter (Signed)
After verifying allergy to iodinated contrast media, a 13-hour prep was e-scribed to her [facility] in [place].  She is to take Prednisone 93m 9/5 @ 2100 and 9/6 @ 0300 and 0900; Benadryl 543m9/6 @ 0900.  She stated an understanding of these instructions.

## 2021-06-08 NOTE — Progress Notes (Signed)
Virtual Visit via Video Note  I connected with Heather Salazar on 06/09/21 at 12:00 PM EDT by a video enabled telemedicine application and verified that I am speaking with the correct person using two identifiers.  Location: Patient: Home Provider: Office   I discussed the limitations of evaluation and management by telemedicine and the availability of in person appointments. The patient expressed understanding and agreed to proceed.  I discussed the assessment and treatment plan with the patient. The patient was provided an opportunity to ask questions and all were answered. The patient agreed with the plan and demonstrated an understanding of the instructions.   The patient was advised to call back or seek an in-person evaluation if the symptoms worsen or if the condition fails to improve as anticipated.  I provided 15 minutes of non-face-to-face time during this encounter.   Derrill Center, NP   Psychiatric Initial Adult Assessment   Patient Identification: Heather Salazar MRN:  [id number] Date of Evaluation:  06/09/2021 Referral Source: PHP step down Chief Complaint:  Depression Visit Diagnosis:    ICD-10-CM   1. Severe episode of recurrent major depressive disorder, without psychotic features ([place])  F33.2       History of Present Illness: Per admission assessment note: Heather Salazar is a 50 y.o. African American female presents with depression and passive suicidal ideations. Patient presents with multiple stressors. Stated that her 72 year old daughter recently attempted suicide. Stated " I know that she was struggling in her relationship."  Reported  " I know that she has a hard time asking for help, she gets that from me."  Heather Salazar stated she is currently followed by primary care provider where she is prescribed Wellbutrin, Cymbalta and Ambien for sleep.  However reports Ambien has not been effective with her reported sleepiness.  Discussed initiating hydroxyzine 25 to  50 mg for sedation.  Patient was receptive to plan.  She reports previous inpatient admissions states her most recent admission was 1 year prior.    Heather Salazar was seen and evaluated at discharge from partial hospitalization programming.  This NP refill patient's Ativan.  Discussed titrating off this medication for anxiety reported symptoms and to restart hydroxyzine.  Continues to deny suicidal or homicidal ideations.  Denies auditory or visual hallucinations.  Patient to start intensive outpatient programming on 06/07/2021  Associated Signs/Symptoms: Depression Symptoms:  depressed mood, feelings of worthlessness/guilt, difficulty concentrating, anxiety, decreased appetite, (Hypo) Manic Symptoms:  Distractibility, Irritable Mood, Anxiety Symptoms:  Excessive Worry, Psychotic Symptoms:   N/A PTSD Symptoms: NA  Past Psychiatric History: Reported history of major depressive disorder, anxiety.  Previous inpatient admissions with suicide attempt.  Currently prescribed Wellbutrin, Cymbalta, Ambien, Ativan and Topamax  Previous Psychotropic Medications: Yes   Substance Abuse History in the last 12 months:  No.  Consequences of Substance Abuse: NA  Past Medical History:  Past Medical History:  Diagnosis Date   Anxiety    Arthritis    back    Asthma    childhood   Depression    GERD (gastroesophageal reflux disease)    History of colon polyps    Hypertension    PTSD (post-traumatic stress disorder)     Past Surgical History:  Procedure Laterality Date   CESAREAN SECTION     CHOLECYSTECTOMY N/A 03/06/2018   Procedure: LAPAROSCOPIC CHOLECYSTECTOMY ERAS PATHWAY;  Surgeon: Clovis Riley, MD;  Location: [facility];  Service: General;  Laterality: N/A;   COLONOSCOPY     In [place]  [facility] 2016-2017 did have a small polyp   tubaligation      Family Psychiatric History:   Family History:  Family History  Problem Relation Age of Onset   Depression Mother     Asthma Mother    Heart failure Father    Asthma Sister    Asthma Brother    Post-traumatic stress disorder Brother    Drug abuse Other    Drug abuse Daughter    Diabetes Neg Hx    Colon cancer Neg Hx    Esophageal cancer Neg Hx     Social History:   Social History   Socioeconomic History   Marital status: Divorced    Spouse name: Not on file   Number of children: 2   Years of education: Not on file   Highest education level: Associate degree: academic program  Occupational History   Not on file  Tobacco Use   Smoking status: Every Day    Packs/day: 0.50    Types: Cigarettes   Smokeless tobacco: Never  Vaping Use   Vaping Use: Never used  Substance and Sexual Activity   Alcohol use: Yes    Comment: weekends    Drug use: No   Sexual activity: Not on file  Other Topics Concern   Not on file  Social History Narrative   Not on file   Social Determinants of Health   Financial Resource Strain: Not on file  Food Insecurity: Not on file  Transportation Needs: Not on file  Physical Activity: Not on file  Stress: Not on file  Social Connections: Not on file    Additional Social History:   Allergies:   Allergies  Allergen Reactions   Acetaminophen-Codeine Other (See Comments)    Shortness of breath. This was the codeine component, not tylenol.   Iodinated Diagnostic Agents Rash    Felt like she was itching on the inside; needs 13-hour prep   Iodine Solution [Povidone Iodine] Rash   Oxycodone Itching    Metabolic Disorder Labs: Lab Results  Component Value Date   HGBA1C 5.8 (H) 01/19/2015   MPG 120 (H) 01/19/2015   Lab Results  Component Value Date   PROLACTIN 11.9 01/19/2015   Lab Results  Component Value Date   CHOL 215 (H) 10/02/2019   TRIG 140 10/02/2019   HDL 59 10/02/2019   CHOLHDL 3.6 10/02/2019   VLDL 22 01/19/2015   LDLCALC 131 (H) 10/02/2019   LDLCALC 88 01/19/2015   Lab Results  Component Value Date   TSH 0.92 10/02/2019     Therapeutic Level Labs: No results found for: LITHIUM No results found for: CBMZ No results found for: VALPROATE  Current Medications: Current Outpatient Medications  Medication Sig Dispense Refill   acetaminophen (TYLENOL) 650 MG CR tablet Take 1,300 mg by mouth daily with breakfast.     albuterol (VENTOLIN HFA) 108 (90 Base) MCG/ACT inhaler Inhale 2 puffs into the lungs every 4 (four) hours as needed for wheezing or shortness of breath. 18 g 0   benzonatate (TESSALON) 200 MG capsule Take 1 capsule (200 mg total) by mouth 2 (two) times daily as needed for cough. 20 capsule 0   buPROPion (WELLBUTRIN XL) 150 MG 24 hr tablet Take 1 tablet (150 mg total) by mouth every morning. 30 tablet 3   diclofenac (VOLTAREN) 75 MG EC tablet Take 1 tablet (75 mg total) by mouth 2 (two) times daily. 60 tablet 3   doxycycline (VIBRA-TABS) 100 MG tablet Take 1 tablet (  100 mg total) by mouth 2 (two) times daily. 14 tablet 0   DULoxetine (CYMBALTA) 60 MG capsule Take 1 capsule (60 mg total) by mouth 2 (two) times daily.     fexofenadine (ALLEGRA) 180 MG tablet Take 1 tablet (180 mg total) by mouth daily. 90 tablet 3   fluticasone (FLONASE) 50 MCG/ACT nasal spray PLACE 1-2 SPRAYS INTO BOTH NOSTRILS DAILY. 48 mL 4   fluticasone furoate-vilanterol (BREO ELLIPTA) 200-25 MCG/INH AEPB Inhale 1 puff into the lungs daily. 1 each 11   hydrOXYzine (ATARAX/VISTARIL) 25 MG tablet Take 25 mg by mouth 3 (three) times daily as needed.     LORazepam (ATIVAN) 0.5 MG tablet Take 1 tablet (0.5 mg total) by mouth every 8 (eight) hours as needed for anxiety. 30 tablet 0   predniSONE (DELTASONE) 20 MG tablet Take 2 tablets (40 mg total) by mouth daily with breakfast. 10 tablet 0   predniSONE (DELTASONE) 50 MG tablet Take Prednisone 82m by mouth on 06/14/21 @ 9:00pm and on 06/15/21 @ 3:00am and 9:00am.  Also on 06/15/21 @ 9:00am, please take Benadryl 540mby mouth.  Please call usKoreat [phone] any questions. 3 tablet 0    rizatriptan (MAXALT-MLT) 5 MG disintegrating tablet Take 1 tablet (5 mg total) by mouth as needed for migraine. May repeat in 2 hours if needed 10 tablet 3   topiramate (TOPAMAX) 50 MG tablet Take 1 tablet (50 mg total) by mouth daily. 90 tablet 3   traMADol (ULTRAM) 50 MG tablet Take 1-2 tablets (50-100 mg total) by mouth every 8 (eight) hours as needed for moderate pain. Maximum 6 tabs per day. 21 tablet 0   valsartan-hydrochlorothiazide (DIOVAN-HCT) 320-25 MG tablet TAKE 1/2 TABLET BY MOUTH EVERY DAY 45 tablet 0   zolpidem (AMBIEN) 5 MG tablet Take 1 tablet (5 mg total) by mouth at bedtime as needed for sleep. 30 tablet 1   No current facility-administered medications for this visit.    Musculoskeletal: Strength & Muscle Tone: within normal limits Gait & Station: normal Patient leans: N/A  Psychiatric Specialty Exam: Review of Systems  Last menstrual period 09/06/2018.There is no height or weight on file to calculate BMI.  General Appearance: Casual  Eye Contact:  Good  Speech:  Clear and Coherent  Volume:  Normal  Mood:  Anxious and Depressed  Affect:  Congruent  Thought Process:  Coherent  Orientation:  Full (Time, Place, and Person)  Thought Content:  Logical  Suicidal Thoughts:  No  Homicidal Thoughts:  No  Memory:  Immediate;   Fair Recent;   Fair  Judgement:  Fair  Insight:  Fair  Psychomotor Activity:  Normal  Concentration:  Concentration: Good  Recall:  Good  Fund of Knowledge:Good  Language: Good  Akathisia:  No  Handed:  Right  AIMS (if indicated):  done  Assets:  Communication Skills Desire for Improvement Resilience Social Support  ADL's:  Intact  Cognition: WNL  Sleep:  Good   Screenings: GAD-7    Flowsheet Row [facility] Score 13 19 9 13 16       [facility]  from 06/08/2021 in [place] from 06/07/2021 in [place] Counselor from 06/03/2021 in [place] Counselor from 05/20/2021 in [place] Office Visit from 05/05/2021 in [place]  PHQ-2 Total Score 2 3 3 5 6   PHQ-9 Total Score 10 9 11 18 22       Flowsheet Row Counselor from 06/07/2021 in [place] ED from 04/11/2021 in [facility] [place] Error: Question 2 not populated Error: Question 2 not populated       Assessment and Plan:  Patient to start Intensive Outpatient programming (IOP) Continue medications as indicated  Treatment plan was reviewed and agreed upon by NP T. Liset Mcmonigle inpatient Heather Salazar need for continued group services   Derrill Center, NP 8/31/20221:29 PM

## 2021-06-08 NOTE — Assessment & Plan Note (Signed)
Good response to right L5-S1 interlaminar epidural back in April, slight recurrence of pain, repeating injection, return as needed.

## 2021-06-08 NOTE — Progress Notes (Addendum)
    Procedures performed today:    None.  Independent interpretation of notes and tests performed by another provider:   None.  Brief History, Exam, Impression, and Recommendations:    Depression, major, single episode, moderate ([place]) This is a pleasant 50 year old female, we have been treating her for severe depression, with multiple life stressors including witnessing daughter's recent suicide attempt. Historically she was doing well on 60 of Cymbalta, we bumped her up to 60 twice daily for a total of 120 mg, added Wellbutrin 150 mg daily and she has been doing daily intensive outpatient psychiatric group therapy. She is noticing that she is turning a corner, she comes in today with make-up on, she is smiling and I can see more facial expressions indicating resolution of her flat affect. As we need 6 weeks for Cymbalta to get into full stride after changes in dosage we will not make any changes today, she does need some additional paperwork filled out to keep her out until 06/28/2021 as group therapy sessions extend until this date.   Lumbar degenerative disc disease Good response to right L5-S1 interlaminar epidural back in April, slight recurrence of pain, repeating injection, return as needed.  CC to [employer] [phone]   ___________________________________________ Gwen Her. [name], M.D., ABFM., CAQSM. Primary Care and [place] Instructor of [place] [employer] [facility] [employer]

## 2021-06-08 NOTE — Assessment & Plan Note (Addendum)
This is a pleasant 50 year old female, we have been treating her for severe depression, with multiple life stressors including witnessing daughter's recent suicide attempt. Historically she was doing well on 60 of Cymbalta, we bumped her up to 60 twice daily for a total of 120 mg, added Wellbutrin 150 mg daily and she has been doing daily intensive outpatient psychiatric group therapy. She is noticing that she is turning a corner, she comes in today with make-up on, she is smiling and I can see more facial expressions indicating resolution of her flat affect. As we need 6 weeks for Cymbalta to get into full stride after changes in dosage we will not make any changes today, she does need some additional paperwork filled out to keep her out until 06/28/2021 as group therapy sessions extend until this date.

## 2021-06-08 NOTE — Addendum Note (Signed)
Addended by: Silverio Decamp on: 06/08/2021 11:16 AM   Modules accepted: Orders

## 2021-06-08 NOTE — Progress Notes (Signed)
Virtual Visit via Video Note  I connected with Heather Salazar on 06/08/21 at 12:00 PM EDT by a video enabled telemedicine application and verified that I am speaking with the correct person using two identifiers.  At orientation to the IOP program, Case Manager discussed the limitations of evaluation and management by telemedicine and the availability of in person appointments. The patient expressed understanding and agreed to proceed with virtual visits throughout the duration of the program.   Location:  Patient: Patient Home Provider: Home Office   History of Present Illness: MDD  Observations/Objective: Check In: Case Manager checked in with all participants to review discharge dates, insurance authorizations, work-related documents and needs from the treatment team regarding medications. Client stated needs and engaged in discussion. Case Manager introduced a new group member, with group welcoming and starting the joining process.  Initial Therapeutic Activity: Counselor facilitated a check-in with group members to assess mood and current functioning. Client shared details of their mental health management since our last session, including challenges and successes. Counselor engaged group in discussion, covering the following topics: combatting anxiety, self-care practices, medication issues, and grounding practices-5 senses, scattered counting, backwards spelling, and mindful walking. Client presents with moderate depression and moderate anxiety. Client denied any current SI/HI/psychosis.  Second Therapeutic Activity: Counselor introduced [employer], [place] Chaplain to present information and discussion on Grief and Loss. Group members engaged in discussion, sharing how grief impacts them, what comforts them, what emotions are felt, labeling losses, etc. After guest speaker logged off, Counselor prompted group to spend 10-15 minutes journaling to process personal grief and loss  situations. Counselor processed entries with group and client's identified areas for additional processing in individual therapy. Client noted grief and loss related to stage of life.   Check Out: Counselor prompted group members to identify one self-care practice or productivity activity they would like to engage in today. Client plans to attend an appointment. Client endorsed safety plan to be followed to prevent safety issues.  Assessment and Plan: Clinician recommends that Client remain in IOP treatment to better manage mental health symptoms, stabilization and to address treatment plan goals. Clinician recommends adherence to crisis/safety plan, taking medications as prescribed, and following up with medical professionals if any issues arise.    Follow Up Instructions: Clinician will send Webex link for next session. The Client was advised to call back or seek an in-person evaluation if the symptoms worsen or if the condition fails to improve as anticipated.     I provided 180 minutes of non-face-to-face time during this encounter.     Lise Auer, LCSW

## 2021-06-09 ENCOUNTER — Other Ambulatory Visit (HOSPITAL_COMMUNITY): Payer: 59 | Admitting: Psychiatry

## 2021-06-09 DIAGNOSIS — F332 Major depressive disorder, recurrent severe without psychotic features: Secondary | ICD-10-CM

## 2021-06-10 ENCOUNTER — Other Ambulatory Visit (HOSPITAL_COMMUNITY): Payer: 59 | Attending: Psychiatry | Admitting: Psychiatry

## 2021-06-10 ENCOUNTER — Encounter (HOSPITAL_COMMUNITY): Payer: Self-pay

## 2021-06-10 ENCOUNTER — Other Ambulatory Visit: Payer: Self-pay

## 2021-06-10 ENCOUNTER — Other Ambulatory Visit: Payer: Self-pay | Admitting: Sports Medicine

## 2021-06-10 DIAGNOSIS — F321 Major depressive disorder, single episode, moderate: Secondary | ICD-10-CM

## 2021-06-10 DIAGNOSIS — Z79899 Other long term (current) drug therapy: Secondary | ICD-10-CM | POA: Diagnosis not present

## 2021-06-10 DIAGNOSIS — R45851 Suicidal ideations: Secondary | ICD-10-CM | POA: Insufficient documentation

## 2021-06-10 DIAGNOSIS — F332 Major depressive disorder, recurrent severe without psychotic features: Secondary | ICD-10-CM

## 2021-06-10 NOTE — Progress Notes (Signed)
Virtual Visit via Video Note  I connected with Heather Salazar on 06/10/21 at  9:00 AM EDT by a video enabled telemedicine application and verified that I am speaking with the correct person using two identifiers.  At orientation to the IOP program, Case Manager discussed the limitations of evaluation and management by telemedicine and the availability of in person appointments. The patient expressed understanding and agreed to proceed with virtual visits throughout the duration of the program.   Location:  Patient: Patient Home Provider: Home Office   History of Present Illness: MDD  Observations/Objective: Check In: Case Manager checked in with all participants to review discharge dates, insurance authorizations, work-related documents and needs from the treatment team regarding medications. Client stated needs and engaged in discussion.   Initial Therapeutic Activity: Counselor facilitated a check-in with group members to assess mood and current functioning. Client shared details of their mental health management since our last session, including challenges and successes. Counselor engaged group in discussion, covering the following topics: coping mechanisms that distract-coloring, journaling, gaming, word puzzles and how to properly utilize coping skills as to not become dependent on them Client presents with moderate depression and moderate anxiety. Client denied any current SI/HI/psychosis.  Second Therapeutic Activity: Counselor provided psychoeducation on Cognitive Distortions with the group. Counselor introduced terms and types utilizing a video from [employer]. Counselor then processed information with group members. Client identified that they related most with emotional reasoning and catastrophizing styles of distorted thinking. Counselor discussed the importance of retaining the brain to calm emotional thinking to engage in logical, rational and more positive/kind thought processes.  Counselor provided a variety of worksheets targeting CBT interventions for group to do as homework.  Check Out: Counselor prompted group members to identify one self-care practice or productivity activity they would like to engage in today. Client endorsed safety plan to be followed to prevent safety issues.  Assessment and Plan: Clinician recommends that Client remain in IOP treatment to better manage mental health symptoms, stabilization and to address treatment plan goals. Clinician recommends adherence to crisis/safety plan, taking medications as prescribed, and following up with medical professionals if any issues arise.    Follow Up Instructions: Clinician will send Webex link for next session. The Client was advised to call back or seek an in-person evaluation if the symptoms worsen or if the condition fails to improve as anticipated.     I provided 180 minutes of non-face-to-face time during this encounter.     Lise Auer, LCSW

## 2021-06-10 NOTE — Progress Notes (Signed)
Virtual Visit via Video Note  I connected with Heather Salazar on 06/09/21 at  9:00 AM EDT by a video enabled telemedicine application and verified that I am speaking with the correct person using two identifiers.  At orientation to the IOP program, Case Manager discussed the limitations of evaluation and management by telemedicine and the availability of in person appointments. The patient expressed understanding and agreed to proceed with virtual visits throughout the duration of the program.   Location:  Patient: Patient Home Provider: Home Office   History of Present Illness: MDD  Observations/Objective: Check In: Case Manager checked in with all participants to review discharge dates, insurance authorizations, work-related documents and needs from the treatment team regarding medications. Client stated needs and engaged in discussion.   Initial Therapeutic Activity: Counselor facilitated a check-in with group members to assess mood and current functioning. Client shared details of their mental health management since our last session, including challenges and successes. Counselor engaged group in discussion, covering the following topics: medical issues that impact mental health, communication skills, boundary setting, finding the positives, grief and loss and using music as an outlet. Client presents with moderate depression and moderate anxiety. Client denied any current SI/HI/psychosis.  Second Therapeutic Activity: Counselor introduced an activity called the "Inside Out Mask". Counselor provided psychoeducation on the concept of "masking mental health". Group members shared about how they have engaged in this behavior in a variety of settings. Counselor prompted group members to create an image symbolizing what they portray on the outside to the world, vs what they are experiencing internally. Client shared about her internal emotions, pain and conflicts, and how on the outside she  strives for people to think that everything is ok, noting that it is hard to hold all emotions in at this point. Counselor provided psychoeducation about unmasking and getting help for internal emotional distress.   Check Out: Counselor prompted group members to identify one self-care practice or productivity activity they would like to engage in today. Client endorsed safety plan to be followed to prevent safety issues.  Assessment and Plan: Clinician recommends that Client remain in IOP treatment to better manage mental health symptoms, stabilization and to address treatment plan goals. Clinician recommends adherence to crisis/safety plan, taking medications as prescribed, and following up with medical professionals if any issues arise.    Follow Up Instructions: Clinician will send Webex link for next session. The Client was advised to call back or seek an in-person evaluation if the symptoms worsen or if the condition fails to improve as anticipated.     I provided 180 minutes of non-face-to-face time during this encounter.     Lise Auer, LCSW

## 2021-06-11 ENCOUNTER — Other Ambulatory Visit: Payer: Self-pay

## 2021-06-11 ENCOUNTER — Other Ambulatory Visit (HOSPITAL_COMMUNITY): Payer: 59 | Admitting: Psychiatry

## 2021-06-11 DIAGNOSIS — F332 Major depressive disorder, recurrent severe without psychotic features: Secondary | ICD-10-CM

## 2021-06-11 DIAGNOSIS — F321 Major depressive disorder, single episode, moderate: Secondary | ICD-10-CM | POA: Diagnosis not present

## 2021-06-15 ENCOUNTER — Ambulatory Visit
Admission: RE | Admit: 2021-06-15 | Discharge: 2021-06-15 | Disposition: A | Discharge status: Home / Self Care | Payer: Managed Care, Other (non HMO) | Source: Ambulatory Visit | Attending: Sports Medicine | Admitting: Sports Medicine

## 2021-06-15 ENCOUNTER — Ambulatory Visit: Payer: 59 | Admitting: Professional

## 2021-06-15 ENCOUNTER — Other Ambulatory Visit: Payer: Self-pay

## 2021-06-15 DIAGNOSIS — M5136 Other intervertebral disc degeneration, lumbar region: Secondary | ICD-10-CM

## 2021-06-15 MED ORDER — IOPAMIDOL (ISOVUE-M 200) INJECTION 41%
1.0000 mL | Freq: Once | INTRAMUSCULAR | Status: AC
  Administered 2021-06-15: 1 mL via EPIDURAL

## 2021-06-15 MED ORDER — METHYLPREDNISOLONE ACETATE 40 MG/ML INJ SUSP (RADIOLOG
80.0000 mg | Freq: Once | INTRAMUSCULAR | Status: AC
  Administered 2021-06-15: 80 mg via EPIDURAL

## 2021-06-15 NOTE — Discharge Instructions (Signed)

## 2021-06-16 ENCOUNTER — Encounter (HOSPITAL_COMMUNITY): Payer: Self-pay

## 2021-06-16 ENCOUNTER — Other Ambulatory Visit (HOSPITAL_COMMUNITY): Payer: 59 | Admitting: Psychiatry

## 2021-06-16 DIAGNOSIS — F321 Major depressive disorder, single episode, moderate: Secondary | ICD-10-CM | POA: Diagnosis not present

## 2021-06-16 DIAGNOSIS — F332 Major depressive disorder, recurrent severe without psychotic features: Secondary | ICD-10-CM

## 2021-06-16 NOTE — Progress Notes (Signed)
Virtual Visit via Video Note  I connected with [name] on 06/16/21 at  9:00 AM EDT by a video enabled telemedicine application and verified that I am speaking with the correct person using two identifiers.  At orientation to the IOP program, Case Manager discussed the limitations of evaluation and management by telemedicine and the availability of in person appointments. The patient expressed understanding and agreed to proceed with virtual visits throughout the duration of the program.   Location:  Patient: Patient Home Provider: Home Office   History of Present Illness: MDD  Observations/Objective: Check In: Case Manager checked in with all participants to review discharge dates, insurance authorizations, work-related documents and needs from the treatment team regarding medications. Client stated needs and engaged in discussion. Case Manager introduced new Client to the group with group members welcoming and starting the joining process.   Initial Therapeutic Activity: Counselor facilitated a check-in with group members to assess mood and current functioning. Client shared details of their mental health management since our last session, including challenges and successes. Counselor engaged group in discussion, covering the following topics: movement for relaxation, addressing psychosis, mental health advocacy, and addressing financial stress. Client presents with moderate depression and moderate anxiety. Client denied any current SI/HI/psychosis.  Second Therapeutic Activity: Counselor introduced our guest speaker, Einar Grad, [employer] Pharmacist, who shared about psychiatric medications, side effects, treatment considerations and how to communicate with medical professionals. Group Members asked questions and shared medication concerns. Counselor prompted group members to reference a worksheet called, "Body Scan" to jot down questions and concerns about their physical health in  preparation for their upcoming appointments with medical professionals. Client noted issues related to weight management and back pain. Client currently under treatment for back pain.  Counselor encouraged routine medical check-ups, preparing for appointments, following up with recommendations and seeking specialist if needed.   Check Out: Counselor prompted group members to identify one self-care practice or productivity activity they would like to engage in today. Client endorsed safety plan to be followed to prevent safety issues.  Assessment and Plan: Clinician recommends that Client remain in IOP treatment to better manage mental health symptoms, stabilization and to address treatment plan goals. Clinician recommends adherence to crisis/safety plan, taking medications as prescribed, and following up with medical professionals if any issues arise.    Follow Up Instructions: Clinician will send Webex link for next session. The Client was advised to call back or seek an in-person evaluation if the symptoms worsen or if the condition fails to improve as anticipated.     I provided 180 minutes of non-face-to-face time during this encounter.     Lise Auer, LCSW

## 2021-06-16 NOTE — Progress Notes (Signed)
Virtual Visit via Video Note  I connected with Heather Salazar on 06/11/21 at  9:00 AM EDT by a video enabled telemedicine application and verified that I am speaking with the correct person using two identifiers.  At orientation to the IOP program, Case Manager discussed the limitations of evaluation and management by telemedicine and the availability of in person appointments. The patient expressed understanding and agreed to proceed with virtual visits throughout the duration of the program.   Location:  Patient: Patient Home Provider: Home Office   History of Present Illness: MDD  Observations/Objective: Check In: Case Manager checked in with all participants to review discharge dates, insurance authorizations, work-related documents and needs from the treatment team regarding medications. Client stated needs and engaged in discussion.   Initial Therapeutic Activity: Counselor facilitated a check-in with group members to assess mood and current functioning. Client shared details of their mental health management since our last session, including challenges and successes. Counselor engaged group in discussion, covering the following topics: reviewed crisis safety plan, made revisions and shared community crisis services, relaxation techniques, boundary setting, and protective factors. Client presents with moderate depression and moderate anxiety. Client denied any current SI/HI/psychosis.  Second Therapeutic Activity: Counselor introduced [employer], representative with [employer] to share about programming. Group Members asked questions and engaged in discussion, as Cristie Hem shared about Peer Support, Support Groups and the [employer]. Client stated that they are interested in connecting with the support groups for black women.  Check Out: Counselor closed program by allowing time to celebrate a graduating group member. Counselor shared reflections on progress and allow space for  group members to share well wishes and encouragements with the graduating client. Counselor prompted graduating client to share takeaways, reflect on progress and final thoughts for the group. Client endorsed safety plan to be followed to prevent safety issues.  Assessment and Plan: Clinician recommends that Client remain in IOP treatment to better manage mental health symptoms, stabilization and to address treatment plan goals. Clinician recommends adherence to crisis/safety plan, taking medications as prescribed, and following up with medical professionals if any issues arise.    Follow Up Instructions: Clinician will send Webex link for next session. The Client was advised to call back or seek an in-person evaluation if the symptoms worsen or if the condition fails to improve as anticipated.     I provided 180 minutes of non-face-to-face time during this encounter.     Lise Auer, LCSW

## 2021-06-17 ENCOUNTER — Other Ambulatory Visit (HOSPITAL_COMMUNITY): Payer: 59 | Admitting: Psychiatry

## 2021-06-17 ENCOUNTER — Encounter (HOSPITAL_COMMUNITY): Payer: Self-pay

## 2021-06-17 ENCOUNTER — Other Ambulatory Visit: Payer: Self-pay

## 2021-06-17 DIAGNOSIS — F321 Major depressive disorder, single episode, moderate: Secondary | ICD-10-CM | POA: Diagnosis not present

## 2021-06-17 DIAGNOSIS — F332 Major depressive disorder, recurrent severe without psychotic features: Secondary | ICD-10-CM

## 2021-06-17 NOTE — Progress Notes (Signed)
Virtual Visit via Video Note  I connected with Heather Salazar on 06/17/21 at  9:00 AM EDT by a video enabled telemedicine application and verified that I am speaking with the correct person using two identifiers.  At orientation to the IOP program, Case Manager discussed the limitations of evaluation and management by telemedicine and the availability of in person appointments. The patient expressed understanding and agreed to proceed with virtual visits throughout the duration of the program.   Location:  Patient: Patient Home Provider: Home Office   History of Present Illness: MDD  Observations/Objective: Check In: Case Manager checked in with all participants to review discharge dates, insurance authorizations, work-related documents and needs from the treatment team regarding medications. Client stated needs and engaged in discussion.   Initial Therapeutic Activity: Counselor facilitated a check-in with group members to assess mood and current functioning. Client shared details of their mental health management since our last session, including challenges and successes. Counselor engaged group in discussion, covering the following topics: movement for relaxation, addressing psychosis, mental health advocacy, and addressing financial stress. Client presents with moderate depression and moderate anxiety. Client denied any current SI/HI/psychosis.  Second Therapeutic Activity: Counselor introduced the concept of Self-Compassion to the group, prompting a discussion on initial beliefs. Group members shared their definitions, then Counselor prompted group members to take a Self-Compassion Assessment, then we discussed scores and takeaways. Counselor provided psychoeducation using Dr. Erasmo Downer Neff's research and publications on the topic. Counselor ended by walking Clients through a self-compassion exercise called Self-Soothing Self-Compassion. Client shared which technique was most soothing for  them and how they could apply skill in day to day life.   Check Out: Counselor closed program by allowing time to celebrate a graduating group member. Counselor shared reflections on progress and allow space for group members to share well wishes and encouragements with the graduating client. Counselor prompted graduating client to share takeaways, reflect on progress and final thoughts for the group. Counselor prompted group members to identify one self-care practice or productivity activity they would like to engage in today. Client endorsed safety plan to be followed to prevent safety issues.  Assessment and Plan: Clinician recommends that Client remain in IOP treatment to better manage mental health symptoms, stabilization and to address treatment plan goals. Clinician recommends adherence to crisis/safety plan, taking medications as prescribed, and following up with medical professionals if any issues arise.    Follow Up Instructions: Clinician will send Webex link for next session. The Client was advised to call back or seek an in-person evaluation if the symptoms worsen or if the condition fails to improve as anticipated.     I provided 180 minutes of non-face-to-face time during this encounter.     Lise Auer, LCSW

## 2021-06-18 ENCOUNTER — Other Ambulatory Visit (HOSPITAL_COMMUNITY): Payer: 59 | Admitting: Psychiatry

## 2021-06-18 ENCOUNTER — Other Ambulatory Visit: Payer: Self-pay

## 2021-06-18 ENCOUNTER — Encounter (HOSPITAL_COMMUNITY): Payer: Self-pay

## 2021-06-18 DIAGNOSIS — F321 Major depressive disorder, single episode, moderate: Secondary | ICD-10-CM | POA: Diagnosis not present

## 2021-06-18 DIAGNOSIS — F332 Major depressive disorder, recurrent severe without psychotic features: Secondary | ICD-10-CM

## 2021-06-18 MED ORDER — BUPROPION HCL 75 MG PO TABS: 75.0000 mg | ORAL_TABLET | Freq: Every day | ORAL | 0 refills | Status: DC

## 2021-06-18 NOTE — Progress Notes (Signed)
Virtual Visit via Video Note  I connected with Heather Salazar on 06/18/21 at  9:00 AM EDT by a video enabled telemedicine application and verified that I am speaking with the correct person using two identifiers.  Location: Patient: Home Provider: Office   I discussed the limitations of evaluation and management by telemedicine and the availability of in person appointments. The patient expressed understanding and agreed to proceed.     I discussed the assessment and treatment plan with the patient. The patient was provided an opportunity to ask questions and all were answered. The patient agreed with the plan and demonstrated an understanding of the instructions.   The patient was advised to call back or seek an in-person evaluation if the symptoms worsen or if the condition fails to improve as anticipated.  I provided 15 minutes of non-face-to-face time during this encounter.   Derrill Center, NP   [facility] MD/PA/NP OP Progress Note  06/18/2021 3:24 PM Heather Salazar  MRN:  [id number]  Chief Complaint:  "High energy and insomnia"   HPI: Heather Salazar  requested to follow-up due to inability to sleep.  States she recently started taking Wellbutrin 150 mg daily which she attributes to increased energy and restless nights.  States her mood has been controlled since starting medications.  Stated " I been able to clean my entire house, but I cannot sleep."  Stated she is taking Cymbalta 60 mg twice daily which she reports taking and tolerating well.  States she is taking Ambien which hasn't been helpful. Stated she was trazodone 50 mg in the past however was not helpful.  Stated that she still had a few pills available.  Discussed taking trazodone 50 to 100 mg nightly as needed patient was receptive to plan.  Discussed titration to Wellbutrin 150 mg.  Patient to start Wellbutrin 75 mg p.o. daily.  NP will follow early next week.   Visit Diagnosis:    ICD-10-CM   1. Severe episode of  recurrent major depressive disorder, without psychotic features ([place])  F33.2       Past Psychiatric History:   Past Medical History:  Past Medical History:  Diagnosis Date   Anxiety    Arthritis    back    Asthma    childhood   Depression    GERD (gastroesophageal reflux disease)    History of colon polyps    Hypertension    PTSD (post-traumatic stress disorder)     Past Surgical History:  Procedure Laterality Date   CESAREAN SECTION     CHOLECYSTECTOMY N/A 03/06/2018   Procedure: Enigma;  Surgeon: Clovis Riley, MD;  Location: [facility];  Service: General;  Laterality: N/A;   COLONOSCOPY     In [place] [phone] did have a small polyp   tubaligation      Family Psychiatric History:   Family History:  Family History  Problem Relation Age of Onset   Depression Mother    Asthma Mother    Heart failure Father    Asthma Sister    Asthma Brother    Post-traumatic stress disorder Brother    Drug abuse Other    Drug abuse Daughter    Diabetes Neg Hx    Colon cancer Neg Hx    Esophageal cancer Neg Hx     Social History:  Social History   Socioeconomic History   Marital status: Divorced    Spouse name: Not on file   Number of  children: 2   Years of education: Not on file   Highest education level: Associate degree: academic program  Occupational History   Not on file  Tobacco Use   Smoking status: Every Day    Packs/day: 0.50    Types: Cigarettes   Smokeless tobacco: Never  Vaping Use   Vaping Use: Never used  Substance and Sexual Activity   Alcohol use: Yes    Comment: weekends    Drug use: No   Sexual activity: Not on file  Other Topics Concern   Not on file  Social History Narrative   Not on file   Social Determinants of Health   Financial Resource Strain: Not on file  Food Insecurity: Not on file  Transportation Needs: Not on file  Physical Activity: Not on file  Stress: Not on file   Social Connections: Not on file    Allergies:  Allergies  Allergen Reactions   Acetaminophen-Codeine Other (See Comments)    Shortness of breath. This was the codeine component, not tylenol.   Iodinated Diagnostic Agents Rash    Felt like she was itching on the inside; needs 13-hour prep   Iodine Solution [Povidone Iodine] Rash   Oxycodone Itching    Metabolic Disorder Labs: Lab Results  Component Value Date   HGBA1C 5.8 (H) 01/19/2015   MPG 120 (H) 01/19/2015   Lab Results  Component Value Date   PROLACTIN 11.9 01/19/2015   Lab Results  Component Value Date   CHOL 215 (H) 10/02/2019   TRIG 140 10/02/2019   HDL 59 10/02/2019   CHOLHDL 3.6 10/02/2019   VLDL 22 01/19/2015   LDLCALC 131 (H) 10/02/2019   LDLCALC 88 01/19/2015   Lab Results  Component Value Date   TSH 0.92 10/02/2019   TSH 1.732 01/19/2015    Therapeutic Level Labs: No results found for: LITHIUM No results found for: VALPROATE No components found for:  CBMZ  Current Medications: Current Outpatient Medications  Medication Sig Dispense Refill   buPROPion (WELLBUTRIN) 75 MG tablet Take 1 tablet (75 mg total) by mouth daily. 30 tablet 0   acetaminophen (TYLENOL) 650 MG CR tablet Take 1,300 mg by mouth daily with breakfast.     albuterol (VENTOLIN HFA) 108 (90 Base) MCG/ACT inhaler Inhale 2 puffs into the lungs every 4 (four) hours as needed for wheezing or shortness of breath. 18 g 0   benzonatate (TESSALON) 200 MG capsule Take 1 capsule (200 mg total) by mouth 2 (two) times daily as needed for cough. 20 capsule 0   diclofenac (VOLTAREN) 75 MG EC tablet Take 1 tablet (75 mg total) by mouth 2 (two) times daily. 60 tablet 3   doxycycline (VIBRA-TABS) 100 MG tablet Take 1 tablet (100 mg total) by mouth 2 (two) times daily. 14 tablet 0   DULoxetine (CYMBALTA) 60 MG capsule Take 1 capsule (60 mg total) by mouth 2 (two) times daily.     fexofenadine (ALLEGRA) 180 MG tablet Take 1 tablet (180 mg total) by  mouth daily. 90 tablet 3   fluticasone (FLONASE) 50 MCG/ACT nasal spray PLACE 1-2 SPRAYS INTO BOTH NOSTRILS DAILY. 48 mL 4   fluticasone furoate-vilanterol (BREO ELLIPTA) 200-25 MCG/INH AEPB Inhale 1 puff into the lungs daily. 1 each 11   hydrOXYzine (ATARAX/VISTARIL) 25 MG tablet Take 25 mg by mouth 3 (three) times daily as needed.     LORazepam (ATIVAN) 0.5 MG tablet Take 1 tablet (0.5 mg total) by mouth every 8 (eight) hours as needed  for anxiety. 30 tablet 0   predniSONE (DELTASONE) 20 MG tablet Take 2 tablets (40 mg total) by mouth daily with breakfast. 10 tablet 0   predniSONE (DELTASONE) 50 MG tablet Take Prednisone 67m by mouth on 06/14/21 @ 9:00pm and on 06/15/21 @ 3:00am and 9:00am.  Also on 06/15/21 @ 9:00am, please take Benadryl 511mby mouth.  Please call usKoreat [phone] any questions. 3 tablet 0   rizatriptan (MAXALT-MLT) 5 MG disintegrating tablet Take 1 tablet (5 mg total) by mouth as needed for migraine. May repeat in 2 hours if needed 10 tablet 3   topiramate (TOPAMAX) 50 MG tablet Take 1 tablet (50 mg total) by mouth daily. 90 tablet 3   traMADol (ULTRAM) 50 MG tablet Take 1-2 tablets (50-100 mg total) by mouth every 8 (eight) hours as needed for moderate pain. Maximum 6 tabs per day. 21 tablet 0   valsartan-hydrochlorothiazide (DIOVAN-HCT) 320-25 MG tablet TAKE 1/2 TABLET BY MOUTH EVERY DAY 45 tablet 0   zolpidem (AMBIEN) 5 MG tablet Take 1 tablet (5 mg total) by mouth at bedtime as needed for sleep. 30 tablet 1   No current facility-administered medications for this visit.     Musculoskeletal: Strength & Muscle Tone: within normal limits Gait & Station: normal Patient leans: N/A  Psychiatric Specialty Exam: Review of Systems  Last menstrual period 09/06/2018.There is no height or weight on file to calculate BMI.  General Appearance: Casual  Eye Contact:  Good  Speech:  Clear and Coherent  Volume:  Normal  Mood:  Anxious and Irritable  Affect:  Congruent  Thought  Process:  Coherent  Orientation:  Full (Time, Place, and Person)  Thought Content: Logical   Suicidal Thoughts:  No  Homicidal Thoughts:  No  Memory:  Immediate;   Fair Recent;   Fair Remote;   Fair  Judgement:  Fair  Insight:  Fair  Psychomotor Activity:  Normal  Concentration:  Concentration: Fair  Recall:  Good  Fund of Knowledge: Good  Language: Fair  Akathisia:  No  Handed:  Right  AIMS (if indicated): done  Assets:  Communication Skills Intimacy Social Support  ADL5:  Intact  Cognition: WNL  Sleep:  Fair   Screenings: GAD-7    [facility] from [facility]20[facility] from 03/09/2021 in [facility] Visit from 01/21/2021 in [facility] from 12/10/2020 in [facility] G[place]-7 Score 13 19 9 13 16       PHQ2-9    Fl[place]ffice Visit from 06/08/2021 in [facility] 06/07/2021 in [facility] 06/03/2021 in [facility] from 05/20/2021 in [facility] Visit from 05/05/2021 in [facility] Total Score 2 3 3 5 6   PHQ-9 Total Score 10 9 11 18 22       Flowsheet Row Counselor from 06/07/2021 in [facility] from 04/11/2021 in [facility]: Question 2 not populated Error: Question 2 not populated        Assessment and Plan:  Continue Intensive Outpatient programming (IOP) Decreased Wellbutrin 150 mg to 75 mg Orders  placed for Wellbutrin 75 mg p.o. daily Restart trazodone 50 to 100 mg nightly as needed Discontinue  Ambien 5 mg nightly  Treatment plan was reviewed and agreed upon by NP Myrle Sheng and patient Heather Salazar need for continued group services   Derrill Center, NP 06/18/2021, 3:24 PM

## 2021-06-18 NOTE — Progress Notes (Signed)
Virtual Visit via Video Note  I connected with Heather Salazar on 06/18/21 at  9:00 AM EDT by a video enabled telemedicine application and verified that I am speaking with the correct person using two identifiers.  At orientation to the IOP program, Case Manager discussed the limitations of evaluation and management by telemedicine and the availability of in person appointments. The patient expressed understanding and agreed to proceed with virtual visits throughout the duration of the program.   Location:  Patient: Patient Home Provider: Home Office   History of Present Illness: MDD  Observations/Objective: Check In: Case Manager checked in with all participants to review discharge dates, insurance authorizations, work-related documents and needs from the treatment team regarding medications. Client stated needs and engaged in discussion.   Initial Therapeutic Activity: Counselor facilitated a check-in with group members to assess mood and current functioning. Client shared details of their mental health management since our last session, including challenges and successes. Counselor engaged group in discussion, covering the following topics: insomnia, manic episodes, ADLs, cleaning spaces after depressive episodes, and engaging support system in recovery. Client presents with moderate depression and moderate anxiety. Client denied any current SI/HI/psychosis.  Second Therapeutic Activity: Counselor opened conversation for group members to share their thoughts and beliefs on forgiveness. After group discussion, Counselor presented 5 stages of forgiveness and myths about forgiveness. Group members gave feedback and discussed application for self and others. Counselor shared resources from [employer], [employer] and provided group with additional books and workbooks for [employer]. Counselor introduced 2 inspirational videos on self-love and forgiveness, with group identifying takeaways and processing  concepts. Client engaged well in conversation and is making progress towards goals.   Check Out: Counselor closed program by allowing time to celebrate a graduating group member. Counselor shared reflections on progress and allow space for group members to share well wishes and encouragements with the graduating client. Counselor prompted graduating client to share takeaways, reflect on progress and final thoughts for the group. Counselor prompted group members to identify one self-care practice or productivity activity they would like to engage in today. Client endorsed safety plan to be followed to prevent safety issues.  Assessment and Plan: Clinician recommends that Client remain in IOP treatment to better manage mental health symptoms, stabilization and to address treatment plan goals. Clinician recommends adherence to crisis/safety plan, taking medications as prescribed, and following up with medical professionals if any issues arise.    Follow Up Instructions: Clinician will send Webex link for next session. The Client was advised to call back or seek an in-person evaluation if the symptoms worsen or if the condition fails to improve as anticipated.     I provided 180 minutes of non-face-to-face time during this encounter.     Lise Auer, LCSW

## 2021-06-18 NOTE — Addendum Note (Signed)
Addended by: Derrill Center on: 06/18/2021 03:24 PM   Modules accepted: Orders

## 2021-06-21 ENCOUNTER — Encounter (HOSPITAL_COMMUNITY): Payer: Self-pay

## 2021-06-21 ENCOUNTER — Other Ambulatory Visit (HOSPITAL_COMMUNITY): Payer: 59 | Admitting: Psychiatry

## 2021-06-21 ENCOUNTER — Other Ambulatory Visit: Payer: Self-pay

## 2021-06-21 DIAGNOSIS — F321 Major depressive disorder, single episode, moderate: Secondary | ICD-10-CM | POA: Diagnosis not present

## 2021-06-21 DIAGNOSIS — F332 Major depressive disorder, recurrent severe without psychotic features: Secondary | ICD-10-CM

## 2021-06-21 NOTE — Progress Notes (Signed)
Virtual Visit via Video Note  I connected with [name] on 06/21/21 at  9:00 AM EDT by a video enabled telemedicine application and verified that I am speaking with the correct person using two identifiers.  At orientation to the IOP program, Case Manager discussed the limitations of evaluation and management by telemedicine and the availability of in person appointments. The patient expressed understanding and agreed to proceed with virtual visits throughout the duration of the program.   Location:  Patient: Patient Home Provider: Home Office   History of Present Illness: MDD  Observations/Objective: Check In: Case Manager checked in with all participants to review discharge dates, insurance authorizations, work-related documents and needs from the treatment team regarding medications. Client stated needs and engaged in discussion. Case Manager introduced a new Client to the group, with group members welcoming and starting the joining process.   Initial Therapeutic Activity: Counselor facilitated a check-in with group members to assess mood and current functioning. Client shared details of their mental health management since our last session, including challenges and successes. Counselor engaged group in discussion, covering the following topics: community resources, Mental Health programs, assertive communication, addressing physical health needs, and applying coping skills. Client presents with moderate depression and moderate anxiety. Client denied any current SI/HI/psychosis.  Second Therapeutic Activity: Counselor engaged group in initial thoughts on boundaries and boundary setting, with group members sharing challenges they have with setting and keeping them, and the impacts on their mental health. Counselor shared information and resources from [employer], then prompted group to take the Boundaries Quiz. Group members shared their results, with all group members scoring in  the lowest range. Counselor then presented psychoeducation on the 3 types of boundary styles and 8 types of boundaries. Group members engaged appropriately in discussion giving personal examples of their own boundary setting needs.   Check Out: Counselor prompted group members to identify one self-care practice or productivity activity they would like to engage in today. Client endorsed safety plan to be followed to prevent safety issues.  Assessment and Plan: Clinician recommends that Client remain in IOP treatment to better manage mental health symptoms, stabilization and to address treatment plan goals. Clinician recommends adherence to crisis/safety plan, taking medications as prescribed, and following up with medical professionals if any issues arise.    Follow Up Instructions: Clinician will send Webex link for next session. The Client was advised to call back or seek an in-person evaluation if the symptoms worsen or if the condition fails to improve as anticipated.     I provided 180 minutes of non-face-to-face time during this encounter.     Lise Auer, LCSW

## 2021-06-22 ENCOUNTER — Other Ambulatory Visit: Payer: Self-pay

## 2021-06-22 ENCOUNTER — Other Ambulatory Visit (HOSPITAL_COMMUNITY): Payer: 59 | Admitting: Psychiatry

## 2021-06-22 DIAGNOSIS — F321 Major depressive disorder, single episode, moderate: Secondary | ICD-10-CM

## 2021-06-22 NOTE — Progress Notes (Signed)
Virtual Visit via Video Note  I connected with Heather Salazar on 06/22/21 at  9:00 AM EDT by a video enabled telemedicine application and verified that I am speaking with the correct person using two identifiers.  Location: Patient: Home Provider: Office   I discussed the limitations of evaluation and management by telemedicine and the availability of in person appointments. The patient expressed understanding and agreed to proceed.    I discussed the assessment and treatment plan with the patient. The patient was provided an opportunity to ask questions and all were answered. The patient agreed with the plan and demonstrated an understanding of the instructions.   The patient was advised to call back or seek an in-person evaluation if the symptoms worsen or if the condition fails to improve as anticipated.  I provided 15 minutes of non-face-to-face time during this encounter.    Derrill Center, NP   [facility] Program Discharge Summary  Heather Salazar [id number]  Admission date: 06/07/2021  Discharge date: 06/22/2021  Reason for admission:Per admission assessment note: Heather Salazar is a 50 y.o. African American female presents with depression and passive suicidal ideations. Patient presents with multiple stressors. Stated that her 87 year old daughter recently attempted suicide. Stated " I know that she was struggling in her relationship."  Reported  " I know that she has a hard time asking for help, she gets that from me."  Heather Salazar stated she is currently followed by primary care provider where she is prescribed Wellbutrin, Cymbalta and Ambien for sleep.  However reports Ambien has not been effective with her reported sleepiness.  Discussed initiating hydroxyzine 25 to 50 mg for sedation.  Patient was receptive to plan.  She reports previous inpatient admissions states her most recent admission was 1 year prior   Progress in Program  Toward Treatment Goals: [name] attended and participated with daily group session with active and engaged participation.  Denying any suicidal or homicidal ideations.  Denies auditory or visual hallucinations.  Medication adjustments while attending this program. Wellbutrin 150 mg was decreased to 75 mg p.o. daily, restarted Trazodone 50 to 100 mg p.o. nightly as needed and hydroxyzine 25 mg p.o. 3 times daily as needed.  Patient to continue Cymbalta 60 mg daily.   Progress (rationale): Keep follow-up appointments with Heather Salazar, therapist and primary care provider [place] for medication management  Take all medications as prescribed. Keep all follow-up appointments as scheduled.  Do not consume alcohol or use illegal drugs while on prescription medications. Report any adverse effects from your medications to your primary care provider promptly.  In the event of recurrent symptoms or worsening symptoms, call 911, a crisis hotline, or go to the nearest emergency department for evaluation.    Derrill Center, NP 06/22/2021

## 2021-06-22 NOTE — Progress Notes (Signed)
Virtual Visit via Video Note  I connected with Heather Salazar on @TODAY @ at  9:00 AM EDT by a video enabled telemedicine application and verified that I am speaking with the correct person using two identifiers.  Location: Patient: at home Provider: at office   I discussed the limitations of evaluation and management by telemedicine and the availability of in person appointments. The patient expressed understanding and agreed to proceed.  I discussed the assessment and treatment plan with the patient. The patient was provided an opportunity to ask questions and all were answered. The patient agreed with the plan and demonstrated an understanding of the instructions.   The patient was advised to call back or seek an in-person evaluation if the symptoms worsen or if the condition fails to improve as anticipated.  I provided 20 minutes of non-face-to-face time during this encounter.   Dellia Nims, M.Ed,CNA   Patient ID: Heather Salazar, female   DOB: [birth year], 50 y.o.   MRN: [id number] As per previous assessment note by Ricky Ala, NP states: Heather Salazar is a 50 y.o. African American female presents with depression and passive suicidal ideations. Patient presents with multiple stressors. Stated that her 105 year old daughter recently attempted suicide. Stated " I know that she was struggling in her relationship."  Reported  " I know that she has a hard time asking for help, she gets that from me."  [name] stated she is currently followed by primary care provider where she is prescribed Wellbutrin, Cymbalta and Ambien for sleep.  However reports Ambien has not been effective with her reported sleepiness.  Discussed initiating hydroxyzine 25 to 50 mg for sedation.  Patient was receptive to plan.  She reports previous inpatient admissions states her most recent admission was 1 year prior.  Patient was enrolled in partial psychiatric program on 05/18/21-06-04-21.   Primary complaints  include: agitation, anxiety, depression worse, difficulty sleeping, feeling depressed, and feeling suicidal.  Onset of symptoms was gradual with gradually worsening course since that time. Psychosocial Stressors include the following: family and financial.  Reports drinking alcohol occasionally.  Denies illicit drug use.  States her anxiety comes from COVID related symptoms.  States she has been COVID-positive for the past 3 times.  States her job is stressful as she works for [employer].  Reports a good relationship between she and her children.  States her youngest resides in [place].  Denies any other reported family history.     Pt started in [place] today; pt transitioned from [facility].  Pt states that she's been enjoying the groups.  Reports improved sleep.  Denies SI/HI or A/V hallucinations.  On a scale of 1-10 (10 being the worst) pt rates her depression at a 7 and anxiety at a 6.  Pt completed all scheduled days.  Reports feeling a lot better.  "I'm having better days than not."  States she continues to struggle with poor sleep patterns.  On a scale of 1-10 (10 being the worst); pt rates her depression at a 4 and her anxiety at a 8.  Denies SI/HI or A/V hallucinations. A:  D/C today.  F/U with PCP and Francie Massing, [facility] (pt states she will call for an appt).  Strongly recommended support groups.  Pt to return to work as scheduled per PCP/ Francie Massing, [facility] or short term disability company.  R:  Pt receptive.  Dellia Nims, M.Ed,CNA

## 2021-06-22 NOTE — Patient Instructions (Signed)
D:  Patient completed MH-IOP today.  A:  Discharge today.  F/U with PCP and Francie Massing, [facility].  Encouraged support groups.  Return to work as scheduled.  R:  Patient receptive.

## 2021-06-23 ENCOUNTER — Encounter (HOSPITAL_COMMUNITY): Payer: Self-pay | Admitting: Psychiatry

## 2021-06-23 ENCOUNTER — Other Ambulatory Visit (HOSPITAL_COMMUNITY): Payer: 59

## 2021-06-23 ENCOUNTER — Other Ambulatory Visit: Payer: Self-pay

## 2021-06-24 ENCOUNTER — Encounter (HOSPITAL_COMMUNITY): Payer: Self-pay | Admitting: Psychiatry

## 2021-06-24 ENCOUNTER — Ambulatory Visit (HOSPITAL_COMMUNITY): Payer: 59

## 2021-06-24 NOTE — Progress Notes (Addendum)
Virtual Visit via Video Note  I connected with Heather Salazar on 06/22/21 at  9:00 AM EDT by a video enabled telemedicine application and verified that I am speaking with the correct person using two identifiers.  At orientation to the IOP program, Case Manager discussed the limitations of evaluation and management by telemedicine and the availability of in person appointments. The patient expressed understanding and agreed to proceed with virtual visits throughout the duration of the program.   Location:  Patient: Patient Home Provider: Home Office   History of Present Illness: MDD  Observations/Objective: Check In: Case Manager checked in with all participants to review discharge dates, insurance authorizations, work-related documents and needs from the treatment team regarding medications. Client stated needs and engaged in discussion. Case Manager introduced a new Client to the group, with group members welcoming and starting the joining process.   Initial Therapeutic Activity: Counselor facilitated a check-in with group members to assess mood and current functioning. Client shared details of their mental health management since our last session, including challenges and successes. Counselor engaged group in discussion, covering the following topics: criteria for various diagnoses, managing intense emotions, and communicating needs with support system. Client presents with moderate depression and moderate anxiety. Client denied any current SI/HI/psychosis.  Second Therapeutic Activity: Counselor introduced [employer], [place] Chaplain to present information and discussion on Grief and Loss. Group members engaged in discussion, sharing how grief impacts them, what comforts them, what emotions are felt, labeling losses, etc. After guest speaker logged off, Counselor prompted group to spend 10-15 minutes journaling to process personal grief and loss situations. Counselor processed entries  with group and client's identified areas for additional processing in individual therapy. Client noted grief related to loss of family members and mental health episode ripple effects.    Check Out: Counselor closed program by allowing time to celebrated the Client as a graduating group member. Counselor shared reflections on progress and allow space for group members to share well wishes and encouragements with the graduating client. Counselor prompted graduating client to share takeaways, reflect on progress and final thoughts for the group. Counselor prompted group members to identify one self-care practice or productivity activity they would like to engage in today. Client endorsed safety plan to be followed to prevent safety issues.  Assessment and Plan: Clinician recommends that Client step down from IOP to individual therapy, medication management and local support groups. Clinician recommends adherence to crisis/safety plan, taking medications as prescribed, and following up with medical professionals if any issues arise.    Follow Up Instructions: Clinician will send Webex link for next session. The Client was advised to call back or seek an in-person evaluation if the symptoms worsen or if the condition fails to improve as anticipated.     I provided 180 minutes of non-face-to-face time during this encounter.     Lise Auer, LCSW

## 2021-06-25 ENCOUNTER — Ambulatory Visit (HOSPITAL_COMMUNITY): Payer: 59

## 2021-06-28 ENCOUNTER — Ambulatory Visit (HOSPITAL_COMMUNITY): Payer: 59

## 2021-06-29 ENCOUNTER — Ambulatory Visit (HOSPITAL_COMMUNITY): Payer: 59

## 2021-07-01 NOTE — Psych (Signed)
Virtual Visit via Video Note  I connected with Heather Salazar on 05/18/21 at  9:00 AM EDT by a video enabled telemedicine application and verified that I am speaking with the correct person using two identifiers.  Location: Patient: patient home Provider: clinical home office   I discussed the limitations of evaluation and management by telemedicine and the availability of in person appointments. The patient expressed understanding and agreed to proceed.  I discussed the assessment and treatment plan with the patient. The patient was provided an opportunity to ask questions and all were answered. The patient agreed with the plan and demonstrated an understanding of the instructions.   The patient was advised to call back or seek an in-person evaluation if the symptoms worsen or if the condition fails to improve as anticipated.  Pt was provided 240 minutes of non-face-to-face time during this encounter.   Lorin Glass, LCSW    [facility] PHP THERAPIST PROGRESS NOTE  Heather Salazar [id number]  Session Time: 9:00 - 10:00  Participation Level: Active  Behavioral Response: CasualAlertDepressed  Type of Therapy: Group Therapy  Treatment Goals addressed: Coping  Interventions: CBT, DBT, Supportive, and Reframing  Summary: Clinician led check-in regarding current stressors and situation. Clinician utilized active listening and empathetic response and validated patient emotions. Clinician facilitated processing group on pertinent issues.   Therapist Response: Heather Salazar is a 50 y.o. female who presents with depression symptoms. Patient arrived within time allowed and reports that she is feeling "okay." Patient rates her mood at a 7 on a scale of 1-10 with 10 being great. Pt reports she didn't get out of bed yesterday and struggled with depressed mood. Pt states poor appetite. Pt reports struggle with putting herself first. Pt able to process. Pt engaged in discussion.          Session Time: 10:00 - 11:00   Participation Level: Active   Behavioral Response: CasualAlertDepressed   Type of Therapy: Group Therapy   Treatment Goals addressed: Coping   Interventions: CBT, DBT, Supportive and Reframing   Summary: Cln introduced topic of stress management and the model of the "4 A's of stress management:" avoid, alter, accept, and adapt. Group members worked through Advice worker and discussed barriers to utilizing the 4 A's for stressors.      Therapist Response: Pt engaged in discussion and reports understanding of how to utilize the 4 A's.           Session Time: 11:00- 12:00   Participation Level: Active   Behavioral Response: CasualAlertDepressed   Type of Therapy: Group Therapy, OT   Treatment Goals addressed: Coping   Interventions: Psychosocial skills training, Supportive   Summary: Occupational Therapy group   Therapist Response: Patient engaged in group. See OT note.           Session Time: 12:00 -1:00   Participation Level: Active   Behavioral Response: CasualAlertDepressed   Type of Therapy: Group therapy   Treatment Goals addressed: Coping   Interventions: CBT; Solution focused; Supportive; Reframing   Summary: 12:00 - 12:50: Cln led discussion on rest. Cln discussed the need to rewrite social story of rest being earned or last on the list and assert that rest is productive. Group members discussed barriers to allowing themselves rest and the negative self-talk involved.  Cln worked with group to thought challenge and apply self-coaching strategies. 12:50 -1:00 Clinician led check-out. Clinician assessed for immediate needs, medication compliance and efficacy, and safety concerns   Therapist Response: 12:50 - 1:00: Pt  engaged in discussion and reports struggle with allowing themselves rest.  12:50 - 1:00: At check-out, patient rates her mood at a 8 on a scale of 1-10 with 10 being great. Pt reports afternoon plans of grocery  shopping. Pt demonstrates some progress as evidenced by participating in first group session. Patient denies SI/HI at the end of group.    Suicidal/Homicidal: Nowithout intent/plan  Plan: Pt will report decreased depression symptoms, increased self-care, and increased ability to manage symptoms in a healthy manner.   Diagnosis: Severe episode of recurrent major depressive disorder, without psychotic features ([place]) [F33.2]    1. Severe episode of recurrent major depressive disorder, without psychotic features ([place])       Lorin Glass, LCSW 07/01/2021

## 2021-07-01 NOTE — Psych (Signed)
Virtual Visit via Video Note  I connected with Heather Salazar on 05/18/21 at  2:00 PM EDT by a video enabled telemedicine application and verified that I am speaking with the correct person using two identifiers.  Location: Patient: patient home Provider: clinical home office   I discussed the limitations of evaluation and management by telemedicine and the availability of in person appointments. The patient expressed understanding and agreed to proceed.   I discussed the assessment and treatment plan with the patient. The patient was provided an opportunity to ask questions and all were answered. The patient agreed with the plan and demonstrated an understanding of the instructions.   The patient was advised to call back or seek an in-person evaluation if the symptoms worsen or if the condition fails to improve as anticipated.  Cln and pt completed treatment plan and pt stated verbal alignment with plan. Pt gave verbal consent to treatment and virtual treatment, agreement with group commitments, and permission to release information for purposes of any requested paperwork.   I provided 10 minutes of non-face-to-face time during this encounter.   Lorin Glass, LCSW

## 2021-07-08 NOTE — Psych (Signed)
Virtual Visit via Video Note  I connected with Heather Salazar on 05/20/21 at  9:00 AM EDT by a video enabled telemedicine application and verified that I am speaking with the correct person using two identifiers.  Location: Patient: patient home Provider: clinical home office   I discussed the limitations of evaluation and management by telemedicine and the availability of in person appointments. The patient expressed understanding and agreed to proceed.  I discussed the assessment and treatment plan with the patient. The patient was provided an opportunity to ask questions and all were answered. The patient agreed with the plan and demonstrated an understanding of the instructions.   The patient was advised to call back or seek an in-person evaluation if the symptoms worsen or if the condition fails to improve as anticipated.  Pt was provided 240 minutes of non-face-to-face time during this encounter.   Lorin Glass, LCSW    [facility] PHP THERAPIST PROGRESS NOTE  Heather Salazar [id number]  Session Time: 9:00 - 10:00  Participation Level: Active  Behavioral Response: CasualAlertDepressed  Type of Therapy: Group Therapy  Treatment Goals addressed: Coping  Interventions: CBT, DBT, Supportive, and Reframing  Summary: Clinician led check-in regarding current stressors and situation. Clinician utilized active listening and empathetic response and validated patient emotions. Clinician facilitated processing group on pertinent issues.   Therapist Response: Heather Salazar is a 50 y.o. female who presents with depression symptoms. Patient arrived within time allowed and reports that she is feeling "numb." Patient rates her mood at a 4 on a scale of 1-10 with 10 being great. Pt reports she ran errands and washed her hair yesterday. Pt continues to struggle with slowing down and acknowledging her limits. Pt reports feeling emotionally and physically depleted today due to pushing  herself yesterday. Pt able to process. Pt engaged in discussion.         Session Time: 10:00 - 11:00   Participation Level: Active   Behavioral Response: CasualAlertDepressed   Type of Therapy: Group Therapy   Treatment Goals addressed: Coping   Interventions: CBT, DBT, Supportive and Reframing   Summary: Cln led processing group for pt's current struggles. Group members shared stressors and provided support and feedback. Cln brought in topics of boundaries, healthy relationships, and unhealthy thought processes to inform discussion.      Therapist Response: Pt able to process and provide support to group.           Session Time: 11:00 - 12:00   Participation Level: Active   Behavioral Response: CasualAlertDepressed   Type of Therapy: Group Therapy   Treatment Goals addressed: Coping   Interventions: CBT, DBT, Supportive and Reframing   Summary: Cln led discussion on forgiveness. Group members shared ways in which they struggle with forgiveness and how it has hurt them. Cln provided space for group to process. Cln encouraged pt's to consider forgiveness as a journey to free themselves from something holding them back.      Therapist Response: Pt engaged in discussion and is able to process.             Session Time: 12:00 -1:00   Participation Level: Active   Behavioral Response: CasualAlertDepressed   Type of Therapy: Group therapy   Treatment Goals addressed: Coping   Interventions: Psychosocial skills training, Supportive   Summary: 12:00 - 12:50: Occupational Therapy group 12:50 -1:00 Clinician led check-out. Clinician assessed for immediate needs, medication compliance and efficacy, and safety concerns   Therapist Response: 12:00 - 12:50: Patient engaged  in group. See OT note.  12:50 - 1:00: At check-out, patient rates her mood at a 6 on a scale of 1-10 with 10 being great. Pt reports afternoon plans of resting. Pt demonstrates some progress as  evidenced by increased awareness of feelings. Patient denies SI/HI at the end of group.    Suicidal/Homicidal: Nowithout intent/plan  Plan: Pt will report decreased depression symptoms, increased self-care, and increased ability to manage symptoms in a healthy manner.   Diagnosis: Severe episode of recurrent major depressive disorder, without psychotic features ([place]) [F33.2]    1. Severe episode of recurrent major depressive disorder, without psychotic features ([place])       Lorin Glass, LCSW 07/08/2021

## 2021-07-08 NOTE — Psych (Signed)
Virtual Visit via Video Note  I connected with Heather Salazar on 05/19/21 at  9:00 AM EDT by a video enabled telemedicine application and verified that I am speaking with the correct person using two identifiers.  Location: Patient: patient home Provider: clinical home office   I discussed the limitations of evaluation and management by telemedicine and the availability of in person appointments. The patient expressed understanding and agreed to proceed.  I discussed the assessment and treatment plan with the patient. The patient was provided an opportunity to ask questions and all were answered. The patient agreed with the plan and demonstrated an understanding of the instructions.   The patient was advised to call back or seek an in-person evaluation if the symptoms worsen or if the condition fails to improve as anticipated.  Pt was provided 240 minutes of non-face-to-face time during this encounter.   Lorin Glass, LCSW    [facility] PHP THERAPIST PROGRESS NOTE  Heather Salazar [id number]  Session Time: 9:00 - 10:00  Participation Level: Active  Behavioral Response: CasualAlertDepressed  Type of Therapy: Group Therapy  Treatment Goals addressed: Coping  Interventions: CBT, DBT, Supportive, and Reframing  Summary: Clinician led check-in regarding current stressors and situation. Clinician utilized active listening and empathetic response and validated patient emotions. Clinician facilitated processing group on pertinent issues.   Therapist Response: Heather Salazar is a 50 y.o. female who presents with depression symptoms. Patient arrived within time allowed and reports that she is feeling "not great." Patient rates her mood at a 5 on a scale of 1-10 with 10 being great. Pt reports she cleaned the house yesterday and it exacerbated her back pain. Pt reports it negatively impacted her sleep. Pt states struggle with accepting her limitations. Pt able to process. Pt engaged  in discussion.         Session Time: 10:00 - 11:00   Participation Level: Active   Behavioral Response: CasualAlertDepressed   Type of Therapy: Group Therapy   Treatment Goals addressed: Coping   Interventions: CBT, DBT, Supportive and Reframing   Summary: Cln led discussion on guilt and the way it impacts [place]. Cln utilized CBT cognitive distortion: emotional reasoning to inform discussion. Cln encouraged pt's to consider whether the guilt was founded as a first step to address the feeling. Group members discussed feelings of guilt and worked to determine whether those feelings were founded in truth or feeling.      Therapist Response: Pt engaged in discussion and is able to offer a guilt example and work through it with the group.          Session Time: 11:00 - 12:00   Participation Level: Active   Behavioral Response: CasualAlertDepressed   Type of Therapy: Group Therapy   Treatment Goals addressed: Coping   Interventions: Supportive, Reframing   Summary: Spiritual Care group   Therapist Response: Pt engaged in session. See chaplain note           Session Time: 12:00 -1:00   Participation Level: Active   Behavioral Response: CasualAlertDepressed   Type of Therapy: Group therapy   Treatment Goals addressed: Coping   Interventions: Psychosocial skills training, Supportive   Summary: 12:00 - 12:50: Occupational Therapy group 12:50 -1:00 Clinician led check-out. Clinician assessed for immediate needs, medication compliance and efficacy, and safety concerns   Therapist Response: 12:00 - 12:50: Patient engaged in group. See OT note.  12:50 - 1:00: At check-out, patient rates her mood at a 6 on a scale of 1-10  with 10 being great. Pt reports afternoon plans of resting. Pt demonstrates some progress as evidenced by increased energy yesterday. Patient denies SI/HI at the end of group.    Suicidal/Homicidal: Nowithout intent/plan  Plan: Pt will report  decreased depression symptoms, increased self-care, and increased ability to manage symptoms in a healthy manner.   Diagnosis: Severe episode of recurrent major depressive disorder, without psychotic features ([place]) [F33.2]    1. Severe episode of recurrent major depressive disorder, without psychotic features ([place])       Lorin Glass, LCSW 07/08/2021

## 2021-07-08 NOTE — Psych (Signed)
Virtual Visit via Video Note  I connected with Heather Salazar on 05/21/21 at  9:00 AM EDT by a video enabled telemedicine application and verified that I am speaking with the correct person using two identifiers.  Location: Patient: patient home Provider: clinical home office   I discussed the limitations of evaluation and management by telemedicine and the availability of in person appointments. The patient expressed understanding and agreed to proceed.  I discussed the assessment and treatment plan with the patient. The patient was provided an opportunity to ask questions and all were answered. The patient agreed with the plan and demonstrated an understanding of the instructions.   The patient was advised to call back or seek an in-person evaluation if the symptoms worsen or if the condition fails to improve as anticipated.  Pt was provided 120 minutes of non-face-to-face time during this encounter.   Lorin Glass, LCSW    [facility] PHP THERAPIST PROGRESS NOTE  Roann Merk [id number]  Session Time: 9:00 - 10:00  Participation Level: Active  Behavioral Response: CasualAlertDepressed  Type of Therapy: Group Therapy  Treatment Goals addressed: Coping  Interventions: CBT, DBT, Supportive, and Reframing  Summary: Clinician led check-in regarding current stressors and situation. Clinician utilized active listening and empathetic response and validated patient emotions. Clinician facilitated processing group on pertinent issues.   Therapist Response: Heather Salazar is a 50 y.o. female who presents with depression symptoms. Patient arrived within time allowed and reports that she is feeling "irritable." Patient rates her mood at a 5 on a scale of 1-10 with 10 being great. Pt reports she took a 4 hour nap after group yesterday and then struggled to sleep at night. Pt states she feels lethargic and tired. Pt able to process. Pt engaged in discussion.         Session  Time: 10:00 - 11:00   Participation Level: Active   Behavioral Response: CasualAlertDepressed   Type of Therapy: Group Therapy   Treatment Goals addressed: Coping   Interventions: CBT, DBT, Supportive and Reframing   Summary: Cln led processing group for pt's current struggles. Group members shared stressors and provided support and feedback. Cln brought in topics of boundaries, healthy relationships, and unhealthy thought processes to inform discussion.      Therapist Response: Pt able to process and provide support to group.    **Pt chose to leave group at 11 due to a family emergency. Pt states she is safe and denies SI/HI before leaving group.     Suicidal/Homicidal: Nowithout intent/plan  Plan: Pt will report decreased depression symptoms, increased self-care, and increased ability to manage symptoms in a healthy manner.   Diagnosis: Severe episode of recurrent major depressive disorder, without psychotic features ([place]) [F33.2]    1. Severe episode of recurrent major depressive disorder, without psychotic features ([place])       Lorin Glass, LCSW 07/08/2021

## 2021-07-09 NOTE — Psych (Signed)
Virtual Visit via Video Note  I connected with Heather Salazar on 05/27/21 at  9:00 AM EDT by a video enabled telemedicine application and verified that I am speaking with the correct person using two identifiers.  Location: Patient: patient home Provider: clinical home office   I discussed the limitations of evaluation and management by telemedicine and the availability of in person appointments. The patient expressed understanding and agreed to proceed.  I discussed the assessment and treatment plan with the patient. The patient was provided an opportunity to ask questions and all were answered. The patient agreed with the plan and demonstrated an understanding of the instructions.   The patient was advised to call back or seek an in-person evaluation if the symptoms worsen or if the condition fails to improve as anticipated.  Pt was provided 240 minutes of non-face-to-face time during this encounter.   Lorin Glass, LCSW    [facility] PHP THERAPIST PROGRESS NOTE  Anvika Gashi [id number]  Session Time: 9:00 - 10:00  Participation Level: Active  Behavioral Response: CasualAlertDepressed  Type of Therapy: Group Therapy  Treatment Goals addressed: Coping  Interventions: CBT, DBT, Supportive, and Reframing  Summary: Clinician led check-in regarding current stressors and situation. Clinician utilized active listening and empathetic response and validated patient emotions. Clinician facilitated processing group on pertinent issues.   Therapist Response: Rishika Mccollom is a 50 y.o. female who presents with depression symptoms. Patient arrived within time allowed and reports that she is feeling "good." Patient rates her mood at a 9 on a scale of 1-10 with 10 being great. Pt reports she spent the day cooking and her family came over to eat. Pt reports she is feeling good today because she is not physically hurting from the exertion of yesterday. Pt reports struggling with  acceptance. Pt able to process. Pt engaged in discussion.         Session Time: 10:00 - 11:00   Participation Level: Active   Behavioral Response: CasualAlertDepressed   Type of Therapy: Group Therapy   Treatment Goals addressed: Coping   Interventions: CBT, DBT, Supportive and Reframing   Summary: Cln led discussion on healthy relationships. Group members shared issues they have experienced in past relationships and in identifying what "healthy" looks like. Cln discussed respect, trust, and honesty as non-negotiable traits in a healthy dynamic. Group shared and problem solved barriers to recognizing and priortizing these traits.      Therapist Response: Pt engaged in discussion and is able to process.           Session Time: 11:00- 12:00   Participation Level: Active   Behavioral Response: CasualAlertDepressed   Type of Therapy: Group Therapy   Treatment Goals addressed: Coping   Interventions: CBT, DBT, Supportive and Reframing   Summary: Cln introduced topic of CBT cognitive distortions. Cln discussed unhealthy thought patterns and how our thoughts shape our reality and irrational thoughts can alter our perspective. Cln utilized handout "cognitive distortions" to discuss common examples of distorted thoughts and group members worked to identify examples in their own life.      Therapist Response: Pt engaged in discussion and is able to determine examples of distorted thinking in their own life.         Session Time: 12:00 -1:00   Participation Level: Active   Behavioral Response: CasualAlertDepressed   Type of Therapy: Group therapy   Treatment Goals addressed: Coping   Interventions: CBT; Solution focused; Supportive; Reframing   Summary: 12:00 - 12:50: Cln continued  topic of CBT cognitive distortions and utilized handout "Unhealthy Thought Patterns"to review common examples of distorted thought to increase awareness of the distorted thoughts. 12:50 -1:00  Clinician led check-out. Clinician assessed for immediate needs, medication compliance and efficacy, and safety concerns   Therapist Response: 12:50 - 1:00: Pt engaged in discussion and is able to make connections.  12:50 - 1:00: At check-out, patient rates her mood at a 9 on a scale of 1-10 with 10 being great. Pt reports afternoon plans of relaxing. Pt demonstrates some progress as evidenced by increased socialization. Patient denies SI/HI at the end of group.    Suicidal/Homicidal: Nowithout intent/plan  Plan: Pt will report decreased depression symptoms, increased self-care, and increased ability to manage symptoms in a healthy manner.   Diagnosis: Severe episode of recurrent major depressive disorder, without psychotic features ([place]) [F33.2]    1. Severe episode of recurrent major depressive disorder, without psychotic features ([place])       Lorin Glass, LCSW 07/09/2021

## 2021-07-09 NOTE — Psych (Signed)
Virtual Visit via Video Note  I connected with Heather Salazar on 06/03/21 at  9:00 AM EDT by a video enabled telemedicine application and verified that I am speaking with the correct person using two identifiers.  Location: Patient: patient home Provider: clinical home office   I discussed the limitations of evaluation and management by telemedicine and the availability of in person appointments. The patient expressed understanding and agreed to proceed.  I discussed the assessment and treatment plan with the patient. The patient was provided an opportunity to ask questions and all were answered. The patient agreed with the plan and demonstrated an understanding of the instructions.   The patient was advised to call back or seek an in-person evaluation if the symptoms worsen or if the condition fails to improve as anticipated.  Pt was provided 180 minutes of non-face-to-face time during this encounter.   Lorin Glass, LCSW    [facility] PHP THERAPIST PROGRESS NOTE  Heather Salazar [id number]  Session Time: 9:00 - 10:00  Participation Level: Active  Behavioral Response: CasualAlertDepressed  Type of Therapy: Group Therapy  Treatment Goals addressed: Coping  Interventions: CBT, DBT, Supportive, and Reframing  Summary: Clinician led check-in regarding current stressors and situation. Clinician utilized active listening and empathetic response and validated patient emotions. Clinician facilitated processing group on pertinent issues.   Therapist Response: Heather Salazar is a 50 y.o. female who presents with depression symptoms. Patient arrived within time allowed and reports that she is feeling "tired." Patient rates her mood at a 7 on a scale of 1-10 with 10 being great. Pt reports she is over exerted herself and has been in bed the past two days. Pt reports struggling with acceptance of her physical limitations. Pt reports frustration that she cannot do what she used to be  able to do. Pt able to process. Pt engaged in discussion.         Session Time: 10:00 - 11:00   Participation Level: Active   Behavioral Response: CasualAlertDepressed   Type of Therapy: Group Therapy   Treatment Goals addressed: Coping   Interventions: CBT, DBT, Supportive and Reframing   Summary: Cln led discussion on emotional eating. Cln framed eating as a coping skill and group worked to determine what feelings or events trigger them to use food to cope. Group members discussed why food may be an easier coping skill than others. Cln suggested substituting eating for a less problematic distraction skill.      Therapist Response: Pt engaged in discussion.         Session Time: 11:00- 12:00   Participation Level: Active   Behavioral Response: CasualAlertDepressed   Type of Therapy: Group Therapy, OT   Treatment Goals addressed: Coping   Interventions: Psychosocial skills training, Supportive   Summary: Occupational Therapy group   Therapist Response: Patient engaged in group. See OT note.      **Pt left at noon citing technical issue.       Suicidal/Homicidal: Nowithout intent/plan  Plan: Pt will report decreased depression symptoms, increased self-care, and increased ability to manage symptoms in a healthy manner.   Diagnosis: Severe episode of recurrent major depressive disorder, without psychotic features ([place]) [F33.2]    1. Severe episode of recurrent major depressive disorder, without psychotic features ([place])   2. Depression, major, single episode, moderate ([place])       Lorin Glass, LCSW 07/09/2021

## 2021-07-09 NOTE — Psych (Signed)
Virtual Visit via Video Note  I connected with Heather Salazar on 05/25/21 at  9:00 AM EDT by a video enabled telemedicine application and verified that I am speaking with the correct person using two identifiers.  Location: Patient: patient home Provider: clinical home office   I discussed the limitations of evaluation and management by telemedicine and the availability of in person appointments. The patient expressed understanding and agreed to proceed.  I discussed the assessment and treatment plan with the patient. The patient was provided an opportunity to ask questions and all were answered. The patient agreed with the plan and demonstrated an understanding of the instructions.   The patient was advised to call back or seek an in-person evaluation if the symptoms worsen or if the condition fails to improve as anticipated.  Pt was provided 240 minutes of non-face-to-face time during this encounter.   Lorin Glass, LCSW    [facility] PHP THERAPIST PROGRESS NOTE  Heather Salazar [id number]  Session Time: 9:00 - 10:00  Participation Level: Active  Behavioral Response: CasualAlertDepressed  Type of Therapy: Group Therapy  Treatment Goals addressed: Coping  Interventions: CBT, DBT, Supportive, and Reframing  Summary: Clinician led check-in regarding current stressors and situation. Clinician utilized active listening and empathetic response and validated patient emotions. Clinician facilitated processing group on pertinent issues.   Therapist Response: Heather Salazar is a 50 y.o. female who presents with depression symptoms. Patient arrived within time allowed and reports that she is feeling "exhausted." Patient rates her mood at a 6 on a scale of 1-10 with 10 being great. Pt reports her weekend was emotionally draining due to an issue with her daughter and grandchildren. Pt reports struggling with balance. Pt able to process. Pt engaged in discussion.         Session  Time: 10:00 - 11:00   Participation Level: Active   Behavioral Response: CasualAlertDepressed   Type of Therapy: Group Therapy   Treatment Goals addressed: Coping   Interventions: CBT, DBT, Supportive and Reframing   Summary: Cln led processing group for pt's current struggles. Group members shared stressors and provided support and feedback. Cln brought in topics of boundaries, healthy relationships, and unhealthy thought processes to inform discussion.      Therapist Response: Pt able to process and provide support to group.           Session Time: 11:00 - 12:00   Participation Level: Active   Behavioral Response: CasualAlertDepressed   Type of Therapy: Group Therapy   Treatment Goals addressed: Coping   Interventions: CBT, DBT, Supportive and Reframing   Summary: Cln led discussion on communicating our struggles with our support team. Cln introduced the concept of giving disclosures to the people close to [place] regarding the way we act in specific situations or the way we process certain stimuli. Group members discussed ways to provide this "road map to our brain" and in what situations this may be helpful to them      Therapist Response:  Pt engaged in discussion and is able to determine situations in which providing a disclosure can be helpful and practiced doing so.             Session Time: 12:00 -1:00   Participation Level: Active   Behavioral Response: CasualAlertDepressed   Type of Therapy: Group therapy   Treatment Goals addressed: Coping   Interventions: Psychosocial skills training, Supportive   Summary: 12:00 - 12:50: Occupational Therapy group 12:50 -1:00 Clinician led check-out. Clinician assessed for immediate  needs, medication compliance and efficacy, and safety concerns   Therapist Response: 12:00 - 12:50: Patient engaged in group. See OT note.  12:50 - 1:00: At check-out, patient rates her mood at a 8 on a scale of 1-10 with 10 being great. Pt  reports afternoon plans of napping. Pt demonstrates some progress as evidenced by increased sense of self. Patient denies SI/HI at the end of group.    Suicidal/Homicidal: Nowithout intent/plan  Plan: Pt will report decreased depression symptoms, increased self-care, and increased ability to manage symptoms in a healthy manner.   Diagnosis: Severe episode of recurrent major depressive disorder, without psychotic features ([place]) [F33.2]    1. Severe episode of recurrent major depressive disorder, without psychotic features ([place])       Lorin Glass, LCSW 07/09/2021

## 2021-07-09 NOTE — Psych (Signed)
Virtual Visit via Video Note  I connected with Heather Salazar on 05/31/21 at  9:00 AM EDT by a video enabled telemedicine application and verified that I am speaking with the correct person using two identifiers.  Location: Patient: patient home Provider: clinical home office   I discussed the limitations of evaluation and management by telemedicine and the availability of in person appointments. The patient expressed understanding and agreed to proceed.  I discussed the assessment and treatment plan with the patient. The patient was provided an opportunity to ask questions and all were answered. The patient agreed with the plan and demonstrated an understanding of the instructions.   The patient was advised to call back or seek an in-person evaluation if the symptoms worsen or if the condition fails to improve as anticipated.  Pt was provided 180 minutes of non-face-to-face time during this encounter.   Lorin Glass, LCSW    [facility] PHP THERAPIST PROGRESS NOTE  Heather Salazar [id number]  Session Time: 9:00 - 10:00  Participation Level: Active  Behavioral Response: CasualAlertDepressed  Type of Therapy: Group Therapy  Treatment Goals addressed: Coping  Interventions: CBT, DBT, Supportive, and Reframing  Summary: Clinician led check-in regarding current stressors and situation. Clinician utilized active listening and empathetic response and validated patient emotions. Clinician facilitated processing group on pertinent issues.   Therapist Response: Heather Salazar is a 50 y.o. female who presents with depression symptoms. Patient arrived within time allowed and reports that she is feeling "good." Patient rates her mood at a 10 on a scale of 1-10 with 10 being great. Pt reports she is going away with her friend and excited. Pt reports stress over the weekend due to an issue with daughter's partner. Pt able to process. Pt engaged in discussion.         Session Time:  10:00 - 11:00   Participation Level: Active   Behavioral Response: CasualAlertDepressed   Type of Therapy: Group Therapy   Treatment Goals addressed: Coping   Interventions: CBT, DBT, Supportive and Reframing   Summary: Cln led processing group for pt's current struggles. Group members shared stressors and provided support and feedback. Cln brought in topics of boundaries, healthy relationships, and unhealthy thought processes to inform discussion.     Therapist Response: Pt able to process and provide support to group.            Session Time: 11:00- 12:00   Participation Level: Active   Behavioral Response: CasualAlertDepressed   Type of Therapy: Group Therapy   Treatment Goals addressed: Coping   Interventions: CBT, DBT, Supportive and Reframing   Summary: Cln led discussion on impulsivity. Group discussed struggles with impulsivity. Cln highlighted theme of immediacy. Group built insight around the way immediacy interacts with impulsivity. Cln encouraged pt's to consider mantras and grounding statements to remind themselves there is time to think/feel/act.      Therapist Response: Pt engaged in discussion and is able to make connections and gain insight.     **Pt left group with no notice at 12.       Suicidal/Homicidal: Nowithout intent/plan  Plan: Pt will report decreased depression symptoms, increased self-care, and increased ability to manage symptoms in a healthy manner.   Diagnosis: Severe episode of recurrent major depressive disorder, without psychotic features ([place]) [F33.2]    1. Severe episode of recurrent major depressive disorder, without psychotic features ([place])       Lorin Glass, LCSW 07/09/2021

## 2021-07-09 NOTE — Psych (Signed)
Virtual Visit via Video Note  I connected with Heather Salazar on 05/28/21 at  9:00 AM EDT by a video enabled telemedicine application and verified that I am speaking with the correct person using two identifiers.  Location: Patient: patient home Provider: clinical home office   I discussed the limitations of evaluation and management by telemedicine and the availability of in person appointments. The patient expressed understanding and agreed to proceed.  I discussed the assessment and treatment plan with the patient. The patient was provided an opportunity to ask questions and all were answered. The patient agreed with the plan and demonstrated an understanding of the instructions.   The patient was advised to call back or seek an in-person evaluation if the symptoms worsen or if the condition fails to improve as anticipated.  Pt was provided 240 minutes of non-face-to-face time during this encounter.   Lorin Glass, LCSW    [facility] PHP THERAPIST PROGRESS NOTE  Heather Salazar [id number]  Session Time: 9:00 - 10:00  Participation Level: Active  Behavioral Response: CasualAlertDepressed  Type of Therapy: Group Therapy  Treatment Goals addressed: Coping  Interventions: CBT, DBT, Supportive, and Reframing  Summary: Clinician led check-in regarding current stressors and situation. Clinician utilized active listening and empathetic response and validated patient emotions. Clinician facilitated processing group on pertinent issues.   Therapist Response: Heather Salazar is a 50 y.o. female who presents with depression symptoms. Patient arrived within time allowed and reports that she is feeling "good." Patient rates her mood at a 10 on a scale of 1-10 with 10 being great. Pt reports she spent yesterday watching tv. Pt states she got a call from a colleague which stressed her out and she struggled to manage the frustration. Pt states having poor sleep.  Pt able to process. Pt  engaged in discussion.         Session Time: 10:00 - 11:00   Participation Level: Active   Behavioral Response: CasualAlertDepressed   Type of Therapy: Group Therapy   Treatment Goals addressed: Coping   Interventions: CBT, DBT, Supportive and Reframing   Summary: Cln continued discussion on topic of boundaries. Group reviewed previous aspects of boundaries discussed. Cln utilized handout "Tips for [employer]" and group members discussed how to apply the tips. Cln shaped conversation and cued for healthy boundary characteristics.      Therapist Response: Pt engaged in discussion and is able to process ways to increase their healthy boundaries.             Session Time: 11:00- 12:00   Participation Level: Active   Behavioral Response: CasualAlertDepressed   Type of Therapy: Group Therapy   Treatment Goals addressed: Coping   Interventions: CBT, DBT, Supportive and Reframing   Summary: Cln continued topic of CBT cognitive distortions and introduced thought challenging as a way to  utilize the "challenge" C in C-C-C. Group utilized Web designer questions" as a way to introduce challenges and reframe distorted thinking. Group members worked through pt examples to practice challenging distorted thinking.      Therapist Response: Pt engaged in discussion and demonstrates understanding of challenging distorted thoughts through practice.          Session Time: 12:00 -1:00   Participation Level: Active   Behavioral Response: CasualAlertDepressed   Type of Therapy: Group therapy   Treatment Goals addressed: Coping   Interventions: CBT; Solution focused; Supportive; Reframing   Summary: 12:00 - 12:50: Cln introduced topic of positive psychology. Group discussed the 5 ways  to train your brain to scan for the positive: conscious acts of kindness, meditation, exercise, postive event journaling, and gratitudes. Group members discussed how to apply the principles  in their every day life.  12:50 -1:00 Clinician led check-out. Clinician assessed for immediate needs, medication compliance and efficacy, and safety concerns   Therapist Response: 12:50 - 1:00: Pt engaged in discussion and reports she can start by utilizing gratitudes.  12:50 - 1:00: At check-out, patient rates her mood at a 10 on a scale of 1-10 with 10 being great. Pt reports afternoon plans of spending time with her grandchildren. Pt demonstrates some progress as evidenced by increased self care efforts. Patient denies SI/HI at the end of group.    Suicidal/Homicidal: Nowithout intent/plan  Plan: Pt will report decreased depression symptoms, increased self-care, and increased ability to manage symptoms in a healthy manner.   Diagnosis: Severe episode of recurrent major depressive disorder, without psychotic features ([place]) [F33.2]    1. Severe episode of recurrent major depressive disorder, without psychotic features ([place])       Lorin Glass, LCSW 07/09/2021

## 2021-07-09 NOTE — Psych (Signed)
Virtual Visit via Video Note  I connected with Heather Salazar on 05/26/21 at  9:00 AM EDT by a video enabled telemedicine application and verified that I am speaking with the correct person using two identifiers.  Location: Patient: patient home Provider: clinical home office   I discussed the limitations of evaluation and management by telemedicine and the availability of in person appointments. The patient expressed understanding and agreed to proceed.  I discussed the assessment and treatment plan with the patient. The patient was provided an opportunity to ask questions and all were answered. The patient agreed with the plan and demonstrated an understanding of the instructions.   The patient was advised to call back or seek an in-person evaluation if the symptoms worsen or if the condition fails to improve as anticipated.  Pt was provided 240 minutes of non-face-to-face time during this encounter.   Lorin Glass, LCSW    [facility] PHP THERAPIST PROGRESS NOTE  Heather Salazar [id number]  Session Time: 9:00 - 10:00  Participation Level: Active  Behavioral Response: CasualAlertDepressed  Type of Therapy: Group Therapy  Treatment Goals addressed: Coping  Interventions: CBT, DBT, Supportive, and Reframing  Summary: Clinician led check-in regarding current stressors and situation. Clinician utilized active listening and empathetic response and validated patient emotions. Clinician facilitated processing group on pertinent issues.   Therapist Response: Heather Salazar is a 50 y.o. female who presents with depression symptoms. Patient arrived within time allowed and reports that she is feeling "pretty good." Patient rates her mood at a 9 on a scale of 1-10 with 10 being great. Pt reports she was able to sleep better last night and got to see her support system. Pt reports trying to get out of the house more and do more in the afternoons. Pt able to process. Pt engaged in  discussion.         Session Time: 10:00 - 11:00   Participation Level: Active   Behavioral Response: CasualAlertDepressed   Type of Therapy: Group Therapy   Treatment Goals addressed: Coping   Interventions: CBT, DBT, Supportive and Reframing   Summary: Cln led processing group for pt's current struggles. Group members shared stressors and provided support and feedback. Cln brought in topics of boundaries, healthy relationships, and unhealthy thought processes to inform discussion.      Therapist Response: Pt able to process and provide support to group.           Session Time: 11:00 - 12:00   Participation Level: Active   Behavioral Response: CasualAlertDepressed   Type of Therapy: Group Therapy   Treatment Goals addressed: Coping   Interventions: CBT, DBT, Supportive and Reframing   Summary: Nutrition group with dietician K. Watts. Group discussed how nutrition can support our mental health.      Therapist Response: Pt engaged in discussion.           Session Time: 12:00 -1:00   Participation Level: Active   Behavioral Response: CasualAlertDepressed   Type of Therapy: Group therapy   Treatment Goals addressed: Coping   Interventions: Psychosocial skills training, Supportive   Summary: 12:00 - 12:50: Occupational Therapy group 12:50 -1:00 Clinician led check-out. Clinician assessed for immediate needs, medication compliance and efficacy, and safety concerns   Therapist Response: 12:00 - 12:50: Patient engaged in group. See OT note.  12:50 - 1:00: At check-out, patient rates her mood at a 9 on a scale of 1-10 with 10 being great. Pt reports afternoon plans of cooking. Pt demonstrates some progress  as evidenced by increased goal oriented behaviors. Patient denies SI/HI at the end of group.    Suicidal/Homicidal: Nowithout intent/plan  Plan: Pt will report decreased depression symptoms, increased self-care, and increased ability to manage symptoms in a  healthy manner.   Diagnosis: Severe episode of recurrent major depressive disorder, without psychotic features ([place]) [F33.2]    1. Severe episode of recurrent major depressive disorder, without psychotic features ([place])       Lorin Glass, LCSW 07/09/2021

## 2021-07-12 NOTE — Psych (Addendum)
Virtual Visit via Video Note  I connected with Heather Salazar on 06/04/21 at  9:00 AM EDT by a video enabled telemedicine application and verified that I am speaking with the correct person using two identifiers.  Location: Patient: patient home Provider: clinical home office   I discussed the limitations of evaluation and management by telemedicine and the availability of in person appointments. The patient expressed understanding and agreed to proceed.  I discussed the assessment and treatment plan with the patient. The patient was provided an opportunity to ask questions and all were answered. The patient agreed with the plan and demonstrated an understanding of the instructions.   The patient was advised to call back or seek an in-person evaluation if the symptoms worsen or if the condition fails to improve as anticipated.  Pt was provided 240 minutes of non-face-to-face time during this encounter.   Heather Glass, LCSW    [facility] PHP THERAPIST PROGRESS NOTE  Heather Salazar [id number]  Session Time: 9:00 - 10:00  Participation Level: Active  Behavioral Response: CasualAlertDepressed  Type of Therapy: Group Therapy  Treatment Goals addressed: Coping  Interventions: CBT, DBT, Supportive, and Reframing  Summary: Clinician led check-in regarding current stressors and situation. Clinician utilized active listening and empathetic response and validated patient emotions. Clinician facilitated processing group on pertinent issues.   Therapist Response: Heather Salazar is a 50 y.o. female who presents with depression symptoms. Patient arrived within time allowed and reports that she is feeling "okay." Patient rates her mood at a 6 on a scale of 1-10 with 10 being great. Pt reports she again overexerted herself by cooking and cleaning and is experiencing back pain. Pt states struggle with falling asleep and was up/down throughout the night. Pt able to process. Pt engaged in  discussion.         Session Time: 10:00 - 11:00   Participation Level: Active   Behavioral Response: CasualAlertDepressed   Type of Therapy: Group Therapy   Treatment Goals addressed: Coping   Interventions: CBT, DBT, Supportive and Reframing   Summary: Cln led discussion on accountability and the balance between taking responsibility and not beating ourselves up. Group members shared current consequences they are dealing with and how they are processing them. Cln encouraged pt's to utilize the Best Friend Test to make it easier to offer themselves kindness.       Therapist Response: Pt engaged in discussion and is able to process.           Session Time: 11:00- 12:00   Participation Level: Active   Behavioral Response: CasualAlertDepressed   Type of Therapy: Group Therapy, OT   Treatment Goals addressed: Coping   Interventions: Psychosocial skills training, Supportive   Summary: Occupational Therapy group   Therapist Response: Patient engaged in group. See OT note.           Session Time: 12:00 -1:00   Participation Level: Active   Behavioral Response: CasualAlertDepressed   Type of Therapy: Group therapy   Treatment Goals addressed: Coping   Interventions: CBT; Solution focused; Supportive; Reframing   Summary: 12:00 - 12:50: Cln led discussion on positive self-esteem. Group shared struggles they experience with self-esteem. Group highlights difficulty in accepting compliments. Cln encouraged pt's to use checking the facts as a way to challenge the negative thinking which accompanies receving compliments. Cln suggested pt's consider making a compliment file in which they document positive things people say to/about them to review when they are feeling poorly about themselves. 12:50 -1:00  Clinician led check-out. Clinician assessed for immediate needs, medication compliance and efficacy, and safety concerns   Therapist Response: 12:50 - 1:00: Pt engaged in  conversation and shares difficulty in accepting compliments. Pt is able to process and reports being open to starting a compliment file.  12:50 - 1:00: At check-out, patient rates her mood at a 8 on a scale of 1-10 with 10 being great. Pt reports afternoon plans of resting. Pt demonstrates some progress as evidenced by increased change language around limits. Patient denies SI/HI at the end of group.    Suicidal/Homicidal: Nowithout intent/plan  Plan: Pt will discharge from PHP due to meeting treatment goals of decreased depression symptoms, increased self-care, and increased ability to manage symptoms in a healthy manner. Pt will step-down to IOP on 8/29 to continue care. Pt and provider are aligned with discharge. Pt denies SI/HI at time of discharge.   Diagnosis: Severe episode of recurrent major depressive disorder, without psychotic features ([place]) [F33.2]    1. Severe episode of recurrent major depressive disorder, without psychotic features ([place])       Heather Glass, LCSW 07/12/2021

## 2021-07-13 ENCOUNTER — Other Ambulatory Visit (HOSPITAL_COMMUNITY): Payer: Self-pay | Admitting: Family

## 2021-07-14 ENCOUNTER — Ambulatory Visit (INDEPENDENT_AMBULATORY_CARE_PROVIDER_SITE_OTHER): Payer: 59 | Admitting: Professional

## 2021-07-14 DIAGNOSIS — F4323 Adjustment disorder with mixed anxiety and depressed mood: Secondary | ICD-10-CM

## 2021-07-22 ENCOUNTER — Encounter: Payer: Self-pay | Admitting: *Deleted

## 2021-07-22 ENCOUNTER — Inpatient Hospital Stay: Payer: Managed Care, Other (non HMO) | Attending: Hematology & Oncology

## 2021-07-22 ENCOUNTER — Inpatient Hospital Stay: Payer: Managed Care, Other (non HMO) | Admitting: Hematology & Oncology

## 2021-07-22 NOTE — Progress Notes (Signed)
Patient is a no show today. This is the third attempt to see patient as a follow up to her June new patient appointment - 05/12/21, 06/03/2021 and 07/22/21. Patient was a no-show to all appointments.   Spoke to Science writer. She asked that a no-show letter be sent to the patient. Letter composed and mailed to patient on 07/23/2021.  Oncology Nurse Navigator Documentation  Oncology Nurse Navigator Flowsheets 07/22/2021  Abnormal Finding Date -  Diagnosis Status -  Navigator Follow Up Date: -  Navigator Follow Up Reason: -  Navigator Location [facility]  Navigator Encounter Type Appt/Treatment Plan Review;Letter/Fax/Email  Telephone -  Patient Visit Type MedOnc  Treatment Phase Abnormal Scans  Barriers/Navigation Needs Coordination of Care  Education -  Interventions Other  Acuity Level 2-Minimal Needs (1-2 Barriers Identified)  Coordination of Care -  Education Method Written  Support Groups/Services Friends and Family  Time Spent with Patient 30

## 2021-07-23 ENCOUNTER — Encounter: Payer: Self-pay | Admitting: Hematology & Oncology

## 2021-07-29 ENCOUNTER — Other Ambulatory Visit: Payer: Self-pay | Admitting: Sports Medicine

## 2021-07-29 DIAGNOSIS — I10 Essential (primary) hypertension: Secondary | ICD-10-CM

## 2021-08-10 ENCOUNTER — Ambulatory Visit (INDEPENDENT_AMBULATORY_CARE_PROVIDER_SITE_OTHER): Payer: 59 | Admitting: Professional

## 2021-08-10 DIAGNOSIS — F4323 Adjustment disorder with mixed anxiety and depressed mood: Secondary | ICD-10-CM

## 2021-09-01 ENCOUNTER — Other Ambulatory Visit: Payer: Self-pay

## 2021-09-01 ENCOUNTER — Emergency Department
Admission: EM | Admit: 2021-09-01 | Discharge: 2021-09-01 | Disposition: A | Discharge status: Home / Self Care | Payer: Managed Care, Other (non HMO) | Source: Home / Self Care

## 2021-09-01 ENCOUNTER — Encounter: Payer: Self-pay | Admitting: Emergency Medicine

## 2021-09-01 DIAGNOSIS — B349 Viral infection, unspecified: Secondary | ICD-10-CM | POA: Diagnosis not present

## 2021-09-01 DIAGNOSIS — R509 Fever, unspecified: Secondary | ICD-10-CM | POA: Diagnosis not present

## 2021-09-01 DIAGNOSIS — R059 Cough, unspecified: Secondary | ICD-10-CM

## 2021-09-01 HISTORY — DX: COVID-19: U07.1

## 2021-09-01 LAB — POCT INFLUENZA A/B
Influenza A, POC: NEGATIVE
Influenza B, POC: NEGATIVE

## 2021-09-01 LAB — POC SARS CORONAVIRUS 2 AG -  ED: SARS Coronavirus 2 Ag: NEGATIVE

## 2021-09-01 MED ORDER — OSELTAMIVIR PHOSPHATE 75 MG PO CAPS: 75.0000 mg | ORAL_CAPSULE | Freq: Two times a day (BID) | ORAL | 0 refills | Status: DC

## 2021-09-01 MED ORDER — PREDNISONE 20 MG PO TABS: ORAL_TABLET | ORAL | 0 refills | Status: DC

## 2021-09-01 MED ORDER — PROMETHAZINE-DM 6.25-15 MG/5ML PO SYRP: 5.0000 mL | ORAL_SOLUTION | Freq: Two times a day (BID) | ORAL | 0 refills | Status: DC | PRN

## 2021-09-01 MED ORDER — ACETAMINOPHEN 325 MG PO TABS
650.0000 mg | ORAL_TABLET | Freq: Once | ORAL | Status: AC
  Administered 2021-09-01: 650 mg via ORAL

## 2021-09-01 NOTE — Discharge Instructions (Addendum)
Advised patient that COVID-19 was negative as well as influenza.  We will treat her with Tamiflu empirically for viral illness.  Advised patient to take medication as directed with food to completion.  Advised patient may take prednisone burst with first dose of Tamiflu for the next 5 days.  Advised patient may use Promethazine DM in the evening prior to sleep for cough.  Advised patient may alternate between OTC Tylenol 1000 mg 1-2 times daily, as needed with OTC Ibuprofen 800 mg 1-2 times daily, as needed for fever and myalgias.  Advised/encouraged patient increase daily water intake while taking these medications.

## 2021-09-01 NOTE — ED Provider Notes (Signed)
[name]    CSN: [id number] Arrival date & time: 09/01/21  1412      History   Chief Complaint Chief Complaint  Patient presents with   Shortness of Breath   Fever    HPI Heather Salazar is a 50 y.o. female.   HPI 50 year old female presents with shortness of breath, fever and body aches for 2 days.  Patient oral temperature was 103.3 in triage.  Influenza negative COVID-19 negative.  PMH significant for persistent asthma without complication.  Patient reports not taking her Breo elliptical inhaler as prescribed.  Past Medical History:  Diagnosis Date   Anxiety    Arthritis    back    Asthma    childhood   COVID-19    x3   Depression    GERD (gastroesophageal reflux disease)    History of colon polyps    Hypertension    PTSD (post-traumatic stress disorder)     Patient Active Problem List   Diagnosis Date Noted   Pain of left calf 04/21/2021   Retroperitoneal lymphadenopathy 12/21/2020   Stool incontinence 12/10/2020   Polyarthralgia 12/10/2020   Depression, major, single episode, moderate ([place]) 11/12/2020   Lumbar degenerative disc disease 11/12/2020   Persistent asthma without complication 32/44/0102   Smoker 11/29/2019   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   Insomnia 03/05/2019   COVID-19 12/31/2018   Transaminitis 09/14/2018   Gastro-esophageal reflux 09/13/2018   Benign essential hypertension 72/53/6644   Biliary colic 03/47/4259   Annual physical exam 01/16/2015   Polycystic ovarian syndrome 01/16/2015   Obesity (BMI 35.0-39.9 without comorbidity) 01/16/2015    Past Surgical History:  Procedure Laterality Date   CESAREAN SECTION     CHOLECYSTECTOMY N/A 03/06/2018   Procedure: LAPAROSCOPIC CHOLECYSTECTOMY ERAS PATHWAY;  Surgeon: Clovis Riley, MD;  Location: [facility];  Service: General;  Laterality: N/A;   COLONOSCOPY     In [place] [phone] did have a small polyp   tubaligation      OB History   No  obstetric history on file.      Home Medications    Prior to Admission medications   Medication Sig Start Date End Date Taking? Authorizing Provider  acetaminophen (TYLENOL) 650 MG CR tablet Take 1,300 mg by mouth daily with breakfast.   Yes [provider]  oseltamivir (TAMIFLU) 75 MG capsule Take 1 capsule (75 mg total) by mouth every 12 (twelve) hours. 09/01/21  Yes Eliezer Lofts, FNP  predniSONE (DELTASONE) 20 MG tablet Take 3 tabs PO daily x 5 days. 09/01/21  Yes Eliezer Lofts, FNP  promethazine-dextromethorphan (PROMETHAZINE-DM) 6.25-15 MG/5ML syrup Take 5 mLs by mouth 2 (two) times daily as needed for cough. 09/01/21  Yes Eliezer Lofts, FNP  albuterol (VENTOLIN HFA) 108 (90 Base) MCG/ACT inhaler Inhale 2 puffs into the lungs every 4 (four) hours as needed for wheezing or shortness of breath. 05/05/21   Hali Marry, MD  benzonatate (TESSALON) 200 MG capsule Take 1 capsule (200 mg total) by mouth 2 (two) times daily as needed for cough. Patient not taking: Reported on 09/01/2021 04/21/21   Donella Stade, PA-C  buPROPion (WELLBUTRIN) 75 MG tablet Take 1 tablet (75 mg total) by mouth daily. Patient not taking: Reported on 09/01/2021 06/18/21   Derrill Center, NP  diclofenac (VOLTAREN) 75 MG EC tablet Take 1 tablet (75 mg total) by mouth 2 (two) times daily. Patient not taking: Reported on 09/01/2021 12/15/20 12/15/21  Silverio Decamp, MD  doxycycline (  VIBRA-TABS) 100 MG tablet Take 1 tablet (100 mg total) by mouth 2 (two) times daily. Patient not taking: Reported on 09/01/2021 05/05/21   Hali Marry, MD  DULoxetine (CYMBALTA) 60 MG capsule Take 1 capsule (60 mg total) by mouth 2 (two) times daily. Patient not taking: Reported on 09/01/2021 05/11/21   Silverio Decamp, MD  fexofenadine (ALLEGRA) 180 MG tablet Take 1 tablet (180 mg total) by mouth daily. Patient not taking: Reported on 09/01/2021 01/13/21   Silverio Decamp, MD  fluticasone  (FLONASE) 50 MCG/ACT nasal spray PLACE 1-2 SPRAYS INTO BOTH NOSTRILS DAILY. Patient not taking: Reported on 09/01/2021 03/16/20   Silverio Decamp, MD  fluticasone furoate-vilanterol (BREO ELLIPTA) 200-25 MCG/INH AEPB Inhale 1 puff into the lungs daily. 03/09/21   Silverio Decamp, MD  hydrOXYzine (ATARAX/VISTARIL) 25 MG tablet Take 25 mg by mouth 3 (three) times daily as needed. Patient not taking: Reported on 09/01/2021    [provider]  LORazepam (ATIVAN) 0.5 MG tablet Take 1 tablet (0.5 mg total) by mouth every 8 (eight) hours as needed for anxiety. 06/04/21   Derrill Center, NP  rizatriptan (MAXALT-MLT) 5 MG disintegrating tablet Take 1 tablet (5 mg total) by mouth as needed for migraine. May repeat in 2 hours if needed Patient not taking: Reported on 09/01/2021 10/01/19   Silverio Decamp, MD  topiramate (TOPAMAX) 50 MG tablet Take 1 tablet (50 mg total) by mouth daily. Patient not taking: Reported on 09/01/2021 03/29/21   Silverio Decamp, MD  traMADol (ULTRAM) 50 MG tablet Take 1-2 tablets (50-100 mg total) by mouth every 8 (eight) hours as needed for moderate pain. Maximum 6 tabs per day. Patient not taking: Reported on 09/01/2021 12/15/20   Silverio Decamp, MD  valsartan-hydrochlorothiazide (DIOVAN-HCT) 320-25 MG tablet TAKE 1/2 TABLET BY MOUTH EVERY DAY 07/29/21   Silverio Decamp, MD  zolpidem (AMBIEN) 5 MG tablet Take 1 tablet (5 mg total) by mouth at bedtime as needed for sleep. 11/12/20   Silverio Decamp, MD    Family History Family History  Problem Relation Age of Onset   Depression Mother    Asthma Mother    Heart failure Father    Asthma Sister    Asthma Brother    Post-traumatic stress disorder Brother    Drug abuse Other    Drug abuse Daughter    Diabetes Neg Hx    Colon cancer Neg Hx    Esophageal cancer Neg Hx     Social History Social History   Tobacco Use   Smoking status: Former    Packs/day: 0.50    Types:  Cigarettes    Quit date: 04/2021    Years since quitting: 0.3   Smokeless tobacco: Never  Vaping Use   Vaping Use: Never used  Substance Use Topics   Alcohol use: Yes    Comment: weekends    Drug use: No     Allergies   Acetaminophen-codeine, Iodinated diagnostic agents, Iodine solution [povidone iodine], and Oxycodone   Review of Systems Review of Systems  Constitutional:  Positive for fever.  Respiratory:  Positive for cough.   All other systems reviewed and are negative.   Physical Exam Triage Vital Signs ED Triage Vitals  Enc Vitals Group     BP 09/01/21 1443 125/84     Pulse Rate 09/01/21 1443 (!) 115     Resp 09/01/21 1443 (!) 26     Temp 09/01/21 1443 (!) 103.3 F (  39.6 C)     Temp Source 09/01/21 1443 Oral     SpO2 09/01/21 1443 94 %     Weight 09/01/21 1445 240 lb (108.9 kg)     Height 09/01/21 1445 5' 5"  (1.651 m)     Head Circumference --      Peak Flow --      Pain Score 09/01/21 1444 7     Pain Loc --      Pain Edu? --      Excl. in [place]? --    No data found.  Updated Vital Signs BP 125/84 (BP Location: Right Arm)   Pulse (!) 115   Temp (!) 102.1 F (38.9 C) (Oral)   Resp (!) 26   Ht 5' 5"  (1.651 m)   Wt 240 lb (108.9 kg)   LMP 09/06/2018 (Exact Date) Comment: patient signed preg test waiver  SpO2 94%   BMI 39.94 kg/m   Physical Exam Vitals and nursing note reviewed.  Constitutional:      Appearance: Normal appearance. She is obese. She is ill-appearing.  HENT:     Head: Normocephalic.     Right Ear: Tympanic membrane, ear canal and external ear normal.     Left Ear: Tympanic membrane, ear canal and external ear normal.     Mouth/Throat:     Mouth: Mucous membranes are moist.     Pharynx: Oropharynx is clear.  Eyes:     Extraocular Movements: Extraocular movements intact.     Conjunctiva/sclera: Conjunctivae normal.     Pupils: Pupils are equal, round, and reactive to light.  Cardiovascular:     Rate and Rhythm: Normal rate and  regular rhythm.     Pulses: Normal pulses.     Heart sounds: Normal heart sounds.  Pulmonary:     Effort: Pulmonary effort is normal.     Breath sounds: Normal breath sounds. No wheezing, rhonchi or rales.     Comments: Infrequent nonproductive cough noted on exam Musculoskeletal:        General: Normal range of motion.     Cervical back: Normal range of motion and neck supple.  Skin:    General: Skin is warm.  Neurological:     General: No focal deficit present.     Mental Status: She is alert and oriented to person, place, and time.     UC Treatments / Results  Labs (all labs ordered are listed, but only abnormal results are displayed) Labs Reviewed  POCT INFLUENZA A/B  POC SARS CORONAVIRUS 2 AG -  ED    EKG   Radiology No results found.  Procedures Procedures (including critical care time)  Medications Ordered in UC Medications  acetaminophen (TYLENOL) tablet 650 mg (650 mg Oral Given 09/01/21 1500)    Initial Impression / Assessment and Plan / UC Course  I have reviewed the triage vital signs and the nursing notes.  Pertinent labs & imaging results that were available during my care of the patient were reviewed by me and considered in my medical decision making (see chart for details).     MDM: 1.  Viral illness-COVID-19, influenza both negative we will treat empirically with Tamiflu; 2.  Cough-Rx'd prednisone burst for daytime and Promethazine DM for evening prior to sleep; 3.  Fever-patient provided Tylenol 650 mg once in clinic prior to discharge.  Advised patient may use Promethazine DM in the evening prior to sleep for cough.  Advised patient may alternate between OTC Tylenol 1000 mg 1-2  times daily, as needed with OTC Ibuprofen 800 mg 1-2 times daily, as needed for fever and myalgias.  Advised/encouraged patient increase daily water intake while taking these medications.  Work note provided prior to discharge.  Patient discharged home, hemodynamically  stable. Final Clinical Impressions(s) / UC Diagnoses   Final diagnoses:  Viral illness  Cough, unspecified type  Fever, unspecified     Discharge Instructions      Advised patient that COVID-19 was negative as well as influenza.  We will treat her with Tamiflu empirically for viral illness.  Advised patient to take medication as directed with food to completion.  Advised patient may take prednisone burst with first dose of Tamiflu for the next 5 days.  Advised patient may use Promethazine DM in the evening prior to sleep for cough.  Advised patient may alternate between OTC Tylenol 1000 mg 1-2 times daily, as needed with OTC Ibuprofen 800 mg 1-2 times daily, as needed for fever and myalgias.  Advised/encouraged patient increase daily water intake while taking these medications.     ED Prescriptions     Medication Sig Dispense Auth. Provider   oseltamivir (TAMIFLU) 75 MG capsule Take 1 capsule (75 mg total) by mouth every 12 (twelve) hours. 10 capsule Eliezer Lofts, FNP   predniSONE (DELTASONE) 20 MG tablet Take 3 tabs PO daily x 5 days. 15 tablet Eliezer Lofts, FNP   promethazine-dextromethorphan (PROMETHAZINE-DM) 6.25-15 MG/5ML syrup Take 5 mLs by mouth 2 (two) times daily as needed for cough. 118 mL Eliezer Lofts, FNP      PDMP not reviewed this encounter.   Eliezer Lofts, [place] 09/01/21 [phone]

## 2021-09-01 NOTE — ED Triage Notes (Signed)
SOB since Monday w/ cough  Fever & body aches  since Monday  Using albuterol inhale a few times  Temp 103.3 in triage  Tylenol arthritis this am  No flu vaccine

## 2021-09-02 ENCOUNTER — Ambulatory Visit: Payer: Self-pay

## 2021-09-07 ENCOUNTER — Ambulatory Visit: Payer: 59 | Admitting: Professional

## 2021-09-08 ENCOUNTER — Telehealth: Payer: Self-pay | Admitting: Physician Assistant

## 2021-09-08 ENCOUNTER — Encounter: Payer: Self-pay | Admitting: Physician Assistant

## 2021-09-08 DIAGNOSIS — B9689 Other specified bacterial agents as the cause of diseases classified elsewhere: Secondary | ICD-10-CM

## 2021-09-08 DIAGNOSIS — J208 Acute bronchitis due to other specified organisms: Secondary | ICD-10-CM

## 2021-09-08 MED ORDER — BENZONATATE 100 MG PO CAPS: 100.0000 mg | ORAL_CAPSULE | Freq: Three times a day (TID) | ORAL | 0 refills | Status: DC | PRN

## 2021-09-08 MED ORDER — DOXYCYCLINE HYCLATE 100 MG PO TABS: 100.0000 mg | ORAL_TABLET | Freq: Two times a day (BID) | ORAL | 0 refills | Status: DC

## 2021-09-08 NOTE — Patient Instructions (Signed)
Heather Salazar, thank you for joining [name], PA-C for today's virtual visit.  While this provider is not your primary care provider (PCP), if your PCP is located in our provider database this encounter information will be shared with them immediately following your visit.  Consent: (Patient) Heather Salazar provided verbal consent for this virtual visit at the beginning of the encounter.  Current Medications:  Current Outpatient Medications:    acetaminophen (TYLENOL) 650 MG CR tablet, Take 1,300 mg by mouth daily with breakfast., Disp: , Rfl:    albuterol (VENTOLIN HFA) 108 (90 Base) MCG/ACT inhaler, Inhale 2 puffs into the lungs every 4 (four) hours as needed for wheezing or shortness of breath., Disp: 18 g, Rfl: 0   benzonatate (TESSALON) 200 MG capsule, Take 1 capsule (200 mg total) by mouth 2 (two) times daily as needed for cough. (Patient not taking: Reported on 09/01/2021), Disp: 20 capsule, Rfl: 0   buPROPion (WELLBUTRIN) 75 MG tablet, Take 1 tablet (75 mg total) by mouth daily. (Patient not taking: Reported on 09/01/2021), Disp: 30 tablet, Rfl: 0   diclofenac (VOLTAREN) 75 MG EC tablet, Take 1 tablet (75 mg total) by mouth 2 (two) times daily. (Patient not taking: Reported on 09/01/2021), Disp: 60 tablet, Rfl: 3   doxycycline (VIBRA-TABS) 100 MG tablet, Take 1 tablet (100 mg total) by mouth 2 (two) times daily. (Patient not taking: Reported on 09/01/2021), Disp: 14 tablet, Rfl: 0   DULoxetine (CYMBALTA) 60 MG capsule, Take 1 capsule (60 mg total) by mouth 2 (two) times daily. (Patient not taking: Reported on 09/01/2021), Disp: , Rfl:    fexofenadine (ALLEGRA) 180 MG tablet, Take 1 tablet (180 mg total) by mouth daily. (Patient not taking: Reported on 09/01/2021), Disp: 90 tablet, Rfl: 3   fluticasone (FLONASE) 50 MCG/ACT nasal spray, PLACE 1-2 SPRAYS INTO BOTH NOSTRILS DAILY. (Patient not taking: Reported on 09/01/2021), Disp: 48 mL, Rfl: 4   fluticasone  furoate-vilanterol (BREO ELLIPTA) 200-25 MCG/INH AEPB, Inhale 1 puff into the lungs daily., Disp: 1 each, Rfl: 11   hydrOXYzine (ATARAX/VISTARIL) 25 MG tablet, Take 25 mg by mouth 3 (three) times daily as needed. (Patient not taking: Reported on 09/01/2021), Disp: , Rfl:    LORazepam (ATIVAN) 0.5 MG tablet, Take 1 tablet (0.5 mg total) by mouth every 8 (eight) hours as needed for anxiety., Disp: 30 tablet, Rfl: 0   oseltamivir (TAMIFLU) 75 MG capsule, Take 1 capsule (75 mg total) by mouth every 12 (twelve) hours., Disp: 10 capsule, Rfl: 0   predniSONE (DELTASONE) 20 MG tablet, Take 3 tabs PO daily x 5 days., Disp: 15 tablet, Rfl: 0   promethazine-dextromethorphan (PROMETHAZINE-DM) 6.25-15 MG/5ML syrup, Take 5 mLs by mouth 2 (two) times daily as needed for cough., Disp: 118 mL, Rfl: 0   rizatriptan (MAXALT-MLT) 5 MG disintegrating tablet, Take 1 tablet (5 mg total) by mouth as needed for migraine. May repeat in 2 hours if needed (Patient not taking: Reported on 09/01/2021), Disp: 10 tablet, Rfl: 3   topiramate (TOPAMAX) 50 MG tablet, Take 1 tablet (50 mg total) by mouth daily. (Patient not taking: Reported on 09/01/2021), Disp: 90 tablet, Rfl: 3   traMADol (ULTRAM) 50 MG tablet, Take 1-2 tablets (50-100 mg total) by mouth every 8 (eight) hours as needed for moderate pain. Maximum 6 tabs per day. (Patient not taking: Reported on 09/01/2021), Disp: 21 tablet, Rfl: 0   valsartan-hydrochlorothiazide (DIOVAN-HCT) 320-25 MG tablet, TAKE 1/2 TABLET BY MOUTH EVERY DAY, Disp: 45 tablet, Rfl: 0  zolpidem (AMBIEN) 5 MG tablet, Take 1 tablet (5 mg total) by mouth at bedtime as needed for sleep., Disp: 30 tablet, Rfl: 1   Medications ordered in this encounter:  No orders of the defined types were placed in this encounter.    *If you need refills on other medications prior to your next appointment, please contact your pharmacy*  Follow-Up: Call back or seek an in-person evaluation if the symptoms worsen or if  the condition fails to improve as anticipated.  Other Instructions Take antibiotic (Doxycycline) as directed.  Increase fluids.  Get plenty of rest. Use Mucinex for congestion. Continue Breo. Get a pulse checker at home. If < 90 at rest, you can use your albuterol inhaler no more than as directed when needed.  Start the Tessalon in addition to your other cough medication Take a daily probiotic (I recommend Align or Culturelle, but even Activia Yogurt may be beneficial).  A humidifier placed in the bedroom may offer some relief for a dry, scratchy throat of nasal irritation.  Read information below on acute bronchitis.   If symptoms are not resolving or you note any new or worsening symptoms, you will need an in-person evaluation ASAP.   Acute Bronchitis Bronchitis is when the airways that extend from the windpipe into the lungs get red, puffy, and painful (inflamed). Bronchitis often causes thick spit (mucus) to develop. This leads to a cough. A cough is the most common symptom of bronchitis. In acute bronchitis, the condition usually begins suddenly and goes away over time (usually in 2 weeks). Smoking, allergies, and asthma can make bronchitis worse. Repeated episodes of bronchitis may cause more lung problems.  HOME CARE Rest. Drink enough fluids to keep your pee (urine) clear or pale yellow (unless you need to limit fluids as told by your doctor). Only take over-the-counter or prescription medicines as told by your doctor. Avoid smoking and secondhand smoke. These can make bronchitis worse. If you are a smoker, think about using nicotine gum or skin patches. Quitting smoking will help your lungs heal faster. Reduce the chance of getting bronchitis again by: Washing your hands often. Avoiding people with cold symptoms. Trying not to touch your hands to your mouth, nose, or eyes. Follow up with your doctor as told.  GET HELP IF: Your symptoms do not improve after 1 week of treatment.  Symptoms include: Cough. Fever. Coughing up thick spit. Body aches. Chest congestion. Chills. Shortness of breath. Sore throat.  GET HELP RIGHT AWAY IF:  You have an increased fever. You have chills. You have severe shortness of breath. You have bloody thick spit (sputum). You throw up (vomit) often. You lose too much body fluid (dehydration). You have a severe headache. You faint.  MAKE SURE YOU:  Understand these instructions. Will watch your condition. Will get help right away if you are not doing well or get worse. Document Released: 03/14/2008 Document Revised: 05/29/2013 Document Reviewed: 03/19/2013 [facility] Patient Information 2015 [place], [place]. This information is not intended to replace advice given to you by your health care provider. Make sure you discuss any questions you have with your health care provider.    If you have been instructed to have an in-person evaluation today at a local Urgent Care facility, please use the link below. It will take you to a list of all of our available [facility], including address, phone number and hours of operation. Please do not delay care.  [facility]  If  you or a family member do not have a primary care provider, use the link below to schedule a visit and establish care. When you choose a [facility] primary care physician or advanced practice provider, you gain a long-term partner in health. Find a Primary Care Provider  Learn more about [facility] in-office and virtual care options: [facility]

## 2021-09-08 NOTE — Progress Notes (Signed)
Virtual Visit Consent   Heather Salazar, you are scheduled for a virtual visit with a [name] provider today.     Just as with appointments in the office, your consent must be obtained to participate.  Your consent will be active for this visit and any virtual visit you may have with one of our providers in the next 365 days.     If you have a MyChart account, a copy of this consent can be sent to you electronically.  All virtual visits are billed to your insurance company just like a traditional visit in the office.    As this is a virtual visit, video technology does not allow for your provider to perform a traditional examination.  This may limit your provider's ability to fully assess your condition.  If your provider identifies any concerns that need to be evaluated in person or the need to arrange testing (such as labs, EKG, etc.), we will make arrangements to do so.     Although advances in technology are sophisticated, we cannot ensure that it will always work on either your end or our end.  If the connection with a video visit is poor, the visit may have to be switched to a telephone visit.  With either a video or telephone visit, we are not always able to ensure that we have a secure connection.     I need to obtain your verbal consent now.   Are you willing to proceed with your visit today?    [name] has provided verbal consent on 09/08/2021 for a virtual visit (video or telephone).   Heather Salazar, [place]   Date: 09/08/2021 5:20 PM   Virtual Visit via Video Note   I, Heather Salazar, connected with  Heather Salazar  ([id number], [birth year]) on 09/08/21 at  5:00 PM EST by a video-enabled telemedicine application and verified that I am speaking with the correct person using two identifiers.  Location: Patient: Virtual Visit Location Patient: Home Provider: Virtual Visit Location Provider: Home Office   I discussed the limitations of evaluation and  management by telemedicine and the availability of in person appointments. The patient expressed understanding and agreed to proceed.    History of Present Illness: Heather Salazar is a 50 y.o. who identifies as a female who was assigned female at birth, and is being seen today for ongoing URI symptoms. Initially see on 09/01/2021 at [facility] for < 48 hours of flu-like symptoms. Was swabbed for both COVID and Flu, both negative, but was started on Tamiflu due to high suspicion of influenza. She was also given short burst of prednisone and promethazine-dm cough syrup. She endorses taking medication daily as directed. Notes initial improvement in symptoms but now with more significant cough that is associated with green phlegm (previously dry), chest tightness and spells of being hot. Denies true fever. Denies chest pain. Has just rested her Breo inhaler.   HPI: HPI  Problems:  Patient Active Problem List   Diagnosis Date Noted   Pain of left calf 04/21/2021   Retroperitoneal lymphadenopathy 12/21/2020   Stool incontinence 12/10/2020   Polyarthralgia 12/10/2020   Depression, major, single episode, moderate (HCC) 11/12/2020   Lumbar degenerative disc disease 11/12/2020   Persistent asthma without complication 00/34/9179   Smoker 11/29/2019   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   Insomnia 03/05/2019   COVID-19 12/31/2018   Transaminitis 09/14/2018   Gastro-esophageal reflux 09/13/2018   Benign essential hypertension 15/02/6978   Biliary colic 48/10/6551  Annual physical exam 01/16/2015   Polycystic ovarian syndrome 01/16/2015   Obesity (BMI 35.0-39.9 without comorbidity) 01/16/2015    Allergies:  Allergies  Allergen Reactions   Acetaminophen-Codeine Other (See Comments)    Shortness of breath. This was the codeine component, not tylenol.   Iodinated Diagnostic Agents Rash    Felt like she was itching on the inside; needs 13-hour prep   Iodine Solution [Povidone Iodine] Rash   Oxycodone  Itching   Medications:  Current Outpatient Medications:    benzonatate (TESSALON) 100 MG capsule, Take 1 capsule (100 mg total) by mouth 3 (three) times daily as needed for cough., Disp: 30 capsule, Rfl: 0   doxycycline (VIBRA-TABS) 100 MG tablet, Take 1 tablet (100 mg total) by mouth 2 (two) times daily., Disp: 14 tablet, Rfl: 0   acetaminophen (TYLENOL) 650 MG CR tablet, Take 1,300 mg by mouth daily with breakfast., Disp: , Rfl:    albuterol (VENTOLIN HFA) 108 (90 Base) MCG/ACT inhaler, Inhale 2 puffs into the lungs every 4 (four) hours as needed for wheezing or shortness of breath., Disp: 18 g, Rfl: 0   buPROPion (WELLBUTRIN) 75 MG tablet, Take 1 tablet (75 mg total) by mouth daily. (Patient not taking: Reported on 09/01/2021), Disp: 30 tablet, Rfl: 0   diclofenac (VOLTAREN) 75 MG EC tablet, Take 1 tablet (75 mg total) by mouth 2 (two) times daily. (Patient not taking: Reported on 09/01/2021), Disp: 60 tablet, Rfl: 3   DULoxetine (CYMBALTA) 60 MG capsule, Take 1 capsule (60 mg total) by mouth 2 (two) times daily. (Patient not taking: Reported on 09/01/2021), Disp: , Rfl:    fexofenadine (ALLEGRA) 180 MG tablet, Take 1 tablet (180 mg total) by mouth daily. (Patient not taking: Reported on 09/01/2021), Disp: 90 tablet, Rfl: 3   fluticasone (FLONASE) 50 MCG/ACT nasal spray, PLACE 1-2 SPRAYS INTO BOTH NOSTRILS DAILY. (Patient not taking: Reported on 09/01/2021), Disp: 48 mL, Rfl: 4   fluticasone furoate-vilanterol (BREO ELLIPTA) 200-25 MCG/INH AEPB, Inhale 1 puff into the lungs daily., Disp: 1 each, Rfl: 11   hydrOXYzine (ATARAX/VISTARIL) 25 MG tablet, Take 25 mg by mouth 3 (three) times daily as needed. (Patient not taking: Reported on 09/01/2021), Disp: , Rfl:    LORazepam (ATIVAN) 0.5 MG tablet, Take 1 tablet (0.5 mg total) by mouth every 8 (eight) hours as needed for anxiety., Disp: 30 tablet, Rfl: 0   promethazine-dextromethorphan (PROMETHAZINE-DM) 6.25-15 MG/5ML syrup, Take 5 mLs by mouth 2  (two) times daily as needed for cough., Disp: 118 mL, Rfl: 0   rizatriptan (MAXALT-MLT) 5 MG disintegrating tablet, Take 1 tablet (5 mg total) by mouth as needed for migraine. May repeat in 2 hours if needed (Patient not taking: Reported on 09/01/2021), Disp: 10 tablet, Rfl: 3   topiramate (TOPAMAX) 50 MG tablet, Take 1 tablet (50 mg total) by mouth daily. (Patient not taking: Reported on 09/01/2021), Disp: 90 tablet, Rfl: 3   traMADol (ULTRAM) 50 MG tablet, Take 1-2 tablets (50-100 mg total) by mouth every 8 (eight) hours as needed for moderate pain. Maximum 6 tabs per day. (Patient not taking: Reported on 09/01/2021), Disp: 21 tablet, Rfl: 0   valsartan-hydrochlorothiazide (DIOVAN-HCT) 320-25 MG tablet, TAKE 1/2 TABLET BY MOUTH EVERY DAY, Disp: 45 tablet, Rfl: 0   zolpidem (AMBIEN) 5 MG tablet, Take 1 tablet (5 mg total) by mouth at bedtime as needed for sleep., Disp: 30 tablet, Rfl: 1  Observations/Objective: Patient is well-developed, well-nourished in no acute distress.  Resting comfortably on bed at  home.  Head is normocephalic, atraumatic.  No labored breathing. Speech is clear and coherent with logical content.  Patient is alert and oriented at baseline.   Assessment and Plan: 1. Acute bacterial bronchitis - benzonatate (TESSALON) 100 MG capsule; Take 1 capsule (100 mg total) by mouth 3 (three) times daily as needed for cough.  Dispense: 30 capsule; Refill: 0 - doxycycline (VIBRA-TABS) 100 MG tablet; Take 1 tablet (100 mg total) by mouth 2 (two) times daily.  Dispense: 14 tablet; Refill: 0 Rx Doxycycline.  Increase fluids.  Rest.  Saline nasal spray.  Probiotic.  Mucinex as directed.  Humidifier in bedroom. Will add on tessalon to current regimen. Restart Breo as directed.    She will need in-person evaluation for any non-resolving, new or worsening symptoms despite treatment.   Follow Up Instructions: I discussed the assessment and treatment plan with the patient. The patient was  provided an opportunity to ask questions and all were answered. The patient agreed with the plan and demonstrated an understanding of the instructions.  A copy of instructions were sent to the patient via MyChart unless otherwise noted below.   The patient was advised to call back or seek an in-person evaluation if the symptoms worsen or if the condition fails to improve as anticipated.  Time:  I spent 13 minutes with the patient via telehealth technology discussing the above problems/concerns.    Heather Rio, PA-C

## 2021-09-13 ENCOUNTER — Ambulatory Visit: Payer: Managed Care, Other (non HMO) | Admitting: Sports Medicine

## 2021-09-28 ENCOUNTER — Other Ambulatory Visit: Payer: Self-pay | Admitting: Family Medicine

## 2021-09-28 ENCOUNTER — Encounter: Payer: Self-pay | Admitting: Professional

## 2021-09-28 ENCOUNTER — Ambulatory Visit (INDEPENDENT_AMBULATORY_CARE_PROVIDER_SITE_OTHER): Payer: 59 | Admitting: Professional

## 2021-09-28 DIAGNOSIS — F331 Major depressive disorder, recurrent, moderate: Secondary | ICD-10-CM | POA: Diagnosis not present

## 2021-09-28 NOTE — Progress Notes (Signed)
[name] Counselor/Therapist Progress Note  Patient ID: Heather Salazar, MRN: [id number],    Date: 09/28/2021  Time Spent: 41 minutes 09-1240 pm  Treatment Type: Individual Therapy  Reported Symptoms: sad, grieving, disrupted sleep  Mental Status Exam: Appearance:  Well Groomed     Behavior: Appropriate and Sharing  Motor: Normal  Speech/Language:  Clear and Coherent and Normal Rate  Affect: Full Range  Mood: sad  Thought process: goal directed  Thought content:   WNL  Sensory/Perceptual disturbances:   WNL  Orientation: oriented to person, place, time/date, and situation  Attention: Good  Concentration: Good  Memory: WNL  Fund of knowledge:  Good  Insight:   Good  Judgment:  Good  Impulse Control: Good   Risk Assessment: Danger to Self:  No Self-injurious Behavior: No Danger to Others: No Duty to Warn:no Physical Aggression / Violence:No  Access to Firearms a concern: No  Gang Involvement:No   Subjective:  This session was held via video teletherapy due to the coronavirus risk at this time. The patient consented to video teletherapy and was located at her home during this session. She is aware it is the responsibility of the patient to secure confidentiality on her end of the session. The provider was in a private home office for the duration of this session.   The patient arrived on time for her webex appointment appearing nicely groomed and easily engaged  Issues addressed: 1-grieving -loss of work friend Heather Salazar who became like family of 67 years -took a week off work -really has shaken her up -knew she was sick but not as -still in touch with Heather Salazar's children -helping her oldest to deal with the estate -Heather Salazar is the person who helped her understand that she had not grieved the death of her childhood friend Heather Salazar -pt laughing and smiling and shared stories 2-personal -several weeks ago her granddaughter Heather Salazar called her 231am Saturday  morning -when pt arrived she saw him overtop of her daughter with her granddaughter trying to break them up -she had her for three weeks beginning around Veteran's Day -on December 1st her daughter showed up and took her daughter and said she could not hear -it took a week before her daughter contacted her and the patient confronted her on disrespected her -her dad came to her house one day after he visited the pt's daughter   -he did not go inside but told her that she was out of place -her father has been very supportive of her   -she thinks that with her brother having PTSD from his [place] service he has learned  Interventions: Cognitive Behavioral Therapy, Assertiveness/Communication, Psycho-education/Bibliotherapy, and Insight-Oriented  Diagnosis:Major depressive disorder, recurrent episode, moderate ([place])  Plan:  -meet again on Tuesday, October 19, 2021 at [place], [facility]

## 2021-10-15 ENCOUNTER — Encounter: Payer: Self-pay | Admitting: Professional

## 2021-10-15 ENCOUNTER — Ambulatory Visit (INDEPENDENT_AMBULATORY_CARE_PROVIDER_SITE_OTHER): Payer: 59 | Admitting: Professional

## 2021-10-15 ENCOUNTER — Ambulatory Visit: Payer: 59 | Admitting: Professional

## 2021-10-15 DIAGNOSIS — F331 Major depressive disorder, recurrent, moderate: Secondary | ICD-10-CM

## 2021-10-15 NOTE — Progress Notes (Signed)
[facility] Counselor/Therapist Progress Note  Patient ID: Heather Salazar, MRN: [id number],    Date: 10/15/2021  Time Spent: 49 minutes 1001-1050 am  Treatment Type: Individual Therapy  Reported Symptoms: depressed, spiraled during the holiday  Mental Status Exam: Appearance:  Well Groomed     Behavior: Appropriate and Sharing  Motor: Normal  Speech/Language:  Clear and Coherent and Normal Rate  Affect: Full Range  Mood: normal  Thought process: goal directed  Thought content:   WNL  Sensory/Perceptual disturbances:   WNL  Orientation: oriented to person, place, time/date, and situation  Attention: Good  Concentration: Good  Memory: WNL  Fund of knowledge:  Good  Insight:   Good  Judgment:  Good  Impulse Control: Good   Risk Assessment: Danger to Self:  No Self-injurious Behavior: No Danger to Others: No Duty to Warn:no Physical Aggression / Violence:No  Access to Firearms a concern: No  Gang Involvement:No   Subjective: This session was held via video teletherapy due to the coronavirus risk at this time. The patient consented to video teletherapy and was located at her home during this session. She is aware it is the responsibility of the patient to secure confidentiality on her end of the session. The provider was in a private home office for the duration of this session.   Issues addressed: 1- grief -did not attend Christmas family activities -stayed home by herself -she did not get her new phone connected for about four days so no one could contact her -friend and cousin both came to check on her   -friend sat on sofa while the patient was on the other sofa   -when patient got up her friend asked if she was ready for something to eat and she was not   -cousin came with food and patient ate a little over time -she did have her grandchildren with her on Christmas eve 2-professional -was out of work for three days last week because she couldn't  go -she returned to work and is doing fine -patient has requested to be demoted for the third time   -supervisor told her they have removed ome responsibilities and that it is not as stressful   -patient [place] upset that her supervisor is telling her what is stressful for her   -patient has applied for a lower level position and the supervisor over that department appeared 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 -pt wondering if her heavenly father is wanting her to close one door to open another 3-daughter in abusive relationship -patient slept through daughter's call so she called her dad -father called and talked with patient next day   -he expressed concern -patient reports she will not respond to her daughter's calls anymore   -she clearly stated if it impacts her children patient will call CPS on her -her daughter's father has decided to stop helping daughter pay her bills due to her choices  Problems Addressed  Anxiety, Balancing Work and Family/Multiple Roles, Depression, Low Self-Esteem/Lack of Assertiveness  Goals 1. Develop a realistic perspective regarding demands and obligations of multiple roles and their completion. 2. Develop a support system to assist with multiple roles and responsibilities. Objective Generate a list of self-care activities and make a commitment to regularly participate in such activities. Target Date: 2022-03-08 Frequency: Weekly  Progress: 0 Modality: individual  Objective Identify at least five practical ways to manage stress associated with multiple demands. Target Date: 2022-03-08 Frequency: Weekly  Progress: 0 Modality: individual  Objective Learn and implement stress management and relaxation techniques to reduce fatigue, anxiety, and depressive symptoms. Target Date: 2022-03-08 Frequency: Weekly  Progress: 0 Modality: individual  Objective Implement assertiveness skills and limit setting. Target Date:  2022-03-08 Frequency: Weekly  Progress: 0 Modality: individual  Objective Identify and replace internalized expectations and standards regarding multiple roles that are potentially harmful. Target Date: 2022-03-08 Frequency: Weekly  Progress: 0 Modality: individual  3. Eliminate depressive and anxiety symptoms associated with trying to balance multiple roles. 4. Enhance ability to effectively handle life stressors. 5. Identify and increase intrapersonal, interpersonal, and physical resources to foster positive coping strategies. 6. Improve depressed mood to maximize effective social, occupational, and physical functioning. 7. Improve self-esteem and develop a positive self-image as capable and competent. Objective Identify and strengthen connections to others who affirm self-esteem and respect one's assertiveness. Target Date: 2022-03-08 Frequency: Weekly  Progress: 0 Modality: individual  Related Interventions Assess the client's beliefs about appropriate gender-role behaviors for women, as well as any feelings of role conflict that she may be experiencing. Collaborate with the client to develop concrete short- and long-term goals to decrease depression, considering personal and interpersonal advantages associated with goals and treatment needs; review goals periodically. Use behavioral techniques (i.e., education, modeling, role-playing, corrective feedback, positive reinforcement) to teach the couple problem-solving and conflict-resolution skills, including defining the problem constructively and specifically, brainstorming options, evaluating options, compromise, choosing options and implementing a plan, and evaluating the results. Assist the client with identifying those expectations that are helpful and those that are potentially harmful; help the client discard those that are causing undue stress and strain and replace them with more realistic messages. Assign the couple a homework  exercise to use and record newly learned problem-solving and conflict-resolution skills (or assign "Applying Problem-Solving to Interpersonal Conflict" in the Adult Psychotherapy Homework Planner, 2nd ed. by Bryn Gulling); process results in session. Explore with the client her expectations regarding the balance between work and family, and discuss how this relates to internalized gender role stereotypes. Educate the client on thought-stopping techniques and positive reframing in order to preempt anxious reactions. Identify unresolved aspects of prior traumatic events (e.g., rape, witnessing/experiencing extreme violence, natural disaster) and help the client to resolve them. Offer suggestions as to possible activities or causes to which the client can devote her time and expertise. Aid the client in seeing the connection between a focus on helping others or improving society and a reduction in anxiety (i.e., dwelling on maladaptive thoughts). Explore areas of competency with the client and encourage her to engage in related activities (e.g., dance, singing, arts, volunteer work, joining a book club). Explore the client's fears of being assertive (e.g., fear of rejection, fear of ineffectiveness, fear of ridicule). Teach the client three coping skills (e.g., deep-breathing exercises, muscle-relaxation techniques, stress management, meditation, and guided imagery) to combat the physiological cues of anxiety. Validate the client's feelings and encourage her attempts to listen to herself; reinforce experiences of asserting her rights and feelings. Objective Practice saying "no" to at least one person, declining to comply with a request/favor or identify and assert rights, needs, and wants. Target Date: 2022-03-08 Frequency: Weekly  Progress: 0 Modality: individual  Objective Identify three roadblocks to improved self-esteem. Target Date: 2022-03-08 Frequency: Weekly  Progress: 0 Modality: individual   Objective Verbalize an understanding of the differences between passive, assertive, and aggressive behavior. Target Date: 2022-03-08 Frequency: Weekly  Progress: 0 Modality: individual  Objective Decrease the frequency of making  self-disparaging remarks. Target Date: 2022-03-08 Frequency: Weekly  Progress: 0 Modality: individual  Related Interventions Ask the client to make a list including positive qualities about herself and accomplishments; verbally reinforce positive self-statements. Assist the client in developing a list of positive affirmations to be read three times a day or to be put on a mirror at home. Encourage the client to make at least one positive statement about herself during the therapy session. 8. Increase ability to express needs and desires openly and honestly. 9. Increase assertiveness skills and ability to advocate for self. 10. Recognize the role of precipitating factors such as social oppression, gender roles, prior trauma, and learned behavior in the perpetuation of anxiety. Objective Work through any unresolved aspects of prior traumatic events and understand the impact of trauma on anxiety reactions. Target Date: 2022-03-08 Frequency: Weekly  Progress: 0 Modality: individual  Objective Verbalize the degree to which current life circumstances could be improved in areas such as work, home, and school. Target Date: 2022-03-08 Frequency: Weekly  Progress: 0 Modality: individual  Objective Begin to refer to an internal standard of performance, morals, achievement, appearance, and so on, rather than sociocultural, parental, spousal, or otherwise externally imposed judgments. Target Date: 2022-03-08 Frequency: Weekly  Progress: 0 Modality: individual  Objective Develop the ability to determine when the use of anxiety-reduction techniques is necessary and how to use them effectively in 9 out of 10 anxiety-arousing situations. Target Date: 2022-03-08 Frequency:  Weekly  Progress: 0 Modality: individual  Objective Identify precipitating events, thoughts, feelings, and reactions that are believed to contribute to anxiety. Target Date: 2022-03-08 Frequency: Weekly  Progress: 0 Modality: individual  Related Interventions Aid the client in devising a healthy diet and eating schedule, a pattern of adequate sleep and relaxation, as well as regular cardiovascular exercise. 11. Resolve the core conflict that is the source of anxiety. 12. Understand the relationships among women's roles, gender socialization, cultural background, and depression. Objective Articulate self-care methods to prevent and/or manage depression in the future; create a list of 10 coping methods for future use. Target Date: 2022-03-08 Frequency: Weekly  Progress: 0 Modality: individual  Objective Implement behavioral interventions to overcome depression. Target Date: 2022-03-08 Frequency: Weekly  Progress: 0 Modality: individual  Related Interventions Encourage the client to continue participation in positive social support systems. Assist the client to consider upcoming stressors; brainstorm with her 10 ways to mitigate depression in the future. Objective Increase the frequency of engaging in pleasant activities. Target Date: 2022-03-08 Frequency: Weekly  Progress: 0 Modality: individual  Related Interventions Monitor and encourage the client to attend to personal grooming and healthy sleeping and eating patterns; reinforce improvement. Objective Identify and replace cognitive self-talk that supports depression. Target Date: 2022-03-08 Frequency: Weekly  Progress: 0 Modality: individual  Related Interventions Encourage the client to discuss cognitive distortions, including automatic thoughts (e.g., negative view of self, future, experience) and negative schemas (e.g., core beliefs about self and others based on earlier childhood experiences); assess frequency of negative  self-statements associated with depression.  Diagnosis:Major depressive disorder, recurrent episode, moderate ([place])  Plan:  -meet again on Tuesday, November 02, 2021 at [facility]

## 2021-10-19 ENCOUNTER — Ambulatory Visit: Payer: 59 | Admitting: Professional

## 2021-11-02 ENCOUNTER — Ambulatory Visit (INDEPENDENT_AMBULATORY_CARE_PROVIDER_SITE_OTHER): Payer: 59 | Admitting: Professional

## 2021-11-02 ENCOUNTER — Encounter: Payer: Self-pay | Admitting: Professional

## 2021-11-02 DIAGNOSIS — F331 Major depressive disorder, recurrent, moderate: Secondary | ICD-10-CM | POA: Diagnosis not present

## 2021-11-02 NOTE — Progress Notes (Signed)
[facility] Counselor/Therapist Progress Note  Patient ID: Heather Salazar, MRN: [id number],    Date: 11/02/2021  Time Spent: 42 minutes 1059-1141 am  Treatment Type: Individual Therapy  Reported Symptoms: overwhelmed, hopeful  Mental Status Exam: Appearance:  Well Groomed     Behavior: Appropriate and Sharing  Motor: Normal  Speech/Language:  Clear and Coherent and Normal Rate  Affect: Full Range  Mood: normal  Thought process: goal directed  Thought content:   WNL  Sensory/Perceptual disturbances:   WNL  Orientation: oriented to person, place, time/date, and situation  Attention: Good  Concentration: Good  Memory: WNL  Fund of knowledge:  Good  Insight:   Good  Judgment:  Good  Impulse Control: Good   Risk Assessment: Danger to Self:  No Self-injurious Behavior: No Danger to Others: No Duty to Warn:no Physical Aggression / Violence:No  Access to Firearms a concern: No  Gang Involvement:No   Subjective: This session was held via video teletherapy due to the coronavirus risk at this time. The patient consented to video teletherapy and was located at her home during this session. She is aware it is the responsibility of the patient to secure confidentiality on her end of the session. The provider was in a private home office for the duration of this session.   Issues addressed: 1- professional -has met with retirement planner and she is completed the needed paperwork -found support in another friend/coworker who is in another building   -she feels validated by her friend who is experiencing similar challenges 2-coping -feels positive about her skills -has resumed cooking which is a coping skill for her -has been spending time playing ball with her dog -is taking baths for relaxation -has sought out some of her work relationships that have been supportive 3-family -she and her daughter have a surface relationship at this time   -she had to distance  herself   -her daughter is texting daily so she thinks her daughter understands -she does not see her like she used to -granddaughter spending time with her 4-man dated in past -has been back in touch with patient -pt feeling pressured by him -he declared his love for her -when they stopped dating he took he feedback about areas he needed to improve and did so -pt not ready to date but wants to ensure her choices are sound   -discussed pt's thoughts and she feels validated with her decision to end contact -how to manage contact after she set a boundary with him   -discussed solutions 5-reviewed measurable objectives -pt notes that she had backslid on her progress and is doing well in most areas again  Problems Addressed  Anxiety, Balancing Work and Family/Multiple Roles, Depression, Low Self-Esteem/Lack of Assertiveness  Goals 1. Develop a realistic perspective regarding demands and obligations of multiple roles and their completion. 2. Develop a support system to assist with multiple roles and responsibilities. Objective Generate a list of self-care activities and make a commitment to regularly participate in such activities. Target Date: 2022-03-08 Frequency: Monthly  Progress: 59 Modality: individual  Objective Identify at least five practical ways to manage stress associated with multiple demands. Target Date: 2022-03-08 Frequency: Monthly  Progress: 80 Modality: individual  Objective Learn and implement stress management and relaxation techniques to reduce fatigue, anxiety, and depressive symptoms. Target Date: 2022-03-08 Frequency: Monthly  Progress: 90 Modality: individual  Objective Implement assertiveness skills and limit setting. Target Date: 2022-03-08 Frequency: Monthly  Progress: 90 Modality: individual  Objective Identify and  replace internalized expectations and standards regarding multiple roles that are potentially harmful. Target Date: 2022-03-08 Frequency:  Monthly  Progress: 100 Modality: individual  3. Eliminate depressive and anxiety symptoms associated with trying to balance multiple roles. 4. Enhance ability to effectively handle life stressors. 5. Identify and increase intrapersonal, interpersonal, and physical resources to foster positive coping strategies. 6. Improve depressed mood to maximize effective social, occupational, and physical functioning. 7. Improve self-esteem and develop a positive self-image as capable and competent. Objective Identify and strengthen connections to others who affirm self-esteem and respect one's assertiveness. Target Date: 2022-03-08 Frequency: Monthly  Progress: 90 Modality: individual  Related Interventions Assess the client's beliefs about appropriate gender-role behaviors for women, as well as any feelings of role conflict that she may be experiencing. Collaborate with the client to develop concrete short- and long-term goals to decrease depression, considering personal and interpersonal advantages associated with goals and treatment needs; review goals periodically. Use behavioral techniques (i.e., education, modeling, role-playing, corrective feedback, positive reinforcement) to teach the couple problem-solving and conflict-resolution skills, including defining the problem constructively and specifically, brainstorming options, evaluating options, compromise, choosing options and implementing a plan, and evaluating the results. Assist the client with identifying those expectations that are helpful and those that are potentially harmful; help the client discard those that are causing undue stress and strain and replace them with more realistic messages. Assign the couple a homework exercise to use and record newly learned problem-solving and conflict-resolution skills (or assign "Applying Problem-Solving to Interpersonal Conflict" in the Adult Psychotherapy Homework Planner, 2nd ed. by Bryn Gulling); process  results in session. Explore with the client her expectations regarding the balance between work and family, and discuss how this relates to internalized gender role stereotypes. Educate the client on thought-stopping techniques and positive reframing in order to preempt anxious reactions. Identify unresolved aspects of prior traumatic events (e.g., rape, witnessing/experiencing extreme violence, natural disaster) and help the client to resolve them. Offer suggestions as to possible activities or causes to which the client can devote her time and expertise. Aid the client in seeing the connection between a focus on helping others or improving society and a reduction in anxiety (i.e., dwelling on maladaptive thoughts). Explore areas of competency with the client and encourage her to engage in related activities (e.g., dance, singing, arts, volunteer work, joining a book club). Explore the client's fears of being assertive (e.g., fear of rejection, fear of ineffectiveness, fear of ridicule). Teach the client three coping skills (e.g., deep-breathing exercises, muscle-relaxation techniques, stress management, meditation, and guided imagery) to combat the physiological cues of anxiety. Validate the client's feelings and encourage her attempts to listen to herself; reinforce experiences of asserting her rights and feelings. Objective Practice saying "no" to at least one person, declining to comply with a request/favor or identify and assert rights, needs, and wants. Target Date: 2022-03-08 Frequency: Monthly  Progress: 100 Modality: individual  Objective Identify three roadblocks to improved self-esteem. Target Date: 2022-03-08 Frequency: Monthly  Progress: 90 Modality: individual  Objective Verbalize an understanding of the differences between passive, assertive, and aggressive behavior. Target Date: 2022-03-08 Frequency: Monthly  Progress: 90 Modality: individual  Objective Decrease the frequency  of making self-disparaging remarks. Target Date: 2022-03-08 Frequency: Monthly  Progress: 0 Modality: individual  Related Interventions Ask the client to make a list including positive qualities about herself and accomplishments; verbally reinforce positive self-statements. Assist the client in developing a list of positive affirmations to be read three times a day or to be  put on a mirror at home. Encourage the client to make at least one positive statement about herself during the therapy session. 8. Increase ability to express needs and desires openly and honestly. 9. Increase assertiveness skills and ability to advocate for self. 10. Recognize the role of precipitating factors such as social oppression, gender roles, prior trauma, and learned behavior in the perpetuation of anxiety. Objective Work through any unresolved aspects of prior traumatic events and understand the impact of trauma on anxiety reactions. Target Date: 2022-03-08 Frequency: Monthly  Progress: 87 Modality: individual  Objective Verbalize the degree to which current life circumstances could be improved in areas such as work, home, and school. Target Date: 2022-03-08 Frequency: Monthly  Progress: 0 Modality: individual  Objective Begin to refer to an internal standard of performance, morals, achievement, appearance, and so on, rather than sociocultural, parental, spousal, or otherwise externally imposed judgments. Target Date: 2022-03-08 Frequency: Monthly  Progress: 100 Modality: individual  Objective Develop the ability to determine when the use of anxiety-reduction techniques is necessary and how to use them effectively in 9 out of 10 anxiety-arousing situations. Target Date: 2022-03-08 Frequency: Monthly  Progress: 90 Modality: individual  Objective Identify precipitating events, thoughts, feelings, and reactions that are believed to contribute to anxiety. Target Date: 2022-03-08 Frequency: Monthly  Progress: 90  Modality: individual  Related Interventions Aid the client in devising a healthy diet and eating schedule, a pattern of adequate sleep and relaxation, as well as regular cardiovascular exercise. 11. Resolve the core conflict that is the source of anxiety. 12. Understand the relationships among women's roles, gender socialization, cultural background, and depression. Objective Articulate self-care methods to prevent and/or manage depression in the future; create a list of 10 coping methods for future use. Target Date: 2022-03-08 Frequency: Monthly  Progress: 90 Modality: individual  Objective Implement behavioral interventions to overcome depression. Target Date: 2022-03-08 Frequency: Monthly  Progress: 90 Modality: individual  Related Interventions Encourage the client to continue participation in positive social support systems. Assist the client to consider upcoming stressors; brainstorm with her 10 ways to mitigate depression in the future. Objective Increase the frequency of engaging in pleasant activities. Target Date: 2022-03-08 Frequency: Monthly  Progress: 90 Modality: individual  Related Interventions Monitor and encourage the client to attend to personal grooming and healthy sleeping and eating patterns; reinforce improvement. Objective Identify and replace cognitive self-talk that supports depression. Target Date: 2022-03-08 Frequency: Monthly  Progress: 100 Modality: individual  Related Interventions Encourage the client to discuss cognitive distortions, including automatic thoughts (e.g., negative view of self, future, experience) and negative schemas (e.g., core beliefs about self and others based on earlier childhood experiences); assess frequency of negative self-statements associated with depression.  Diagnosis:Major depressive disorder, recurrent episode, moderate ([place])  Plan:  -meet again on Monday, November 29, 2021 at [place],  [place], [facility]

## 2021-11-03 ENCOUNTER — Other Ambulatory Visit: Payer: Self-pay | Admitting: Sports Medicine

## 2021-11-03 DIAGNOSIS — I10 Essential (primary) hypertension: Secondary | ICD-10-CM

## 2021-11-03 DIAGNOSIS — F321 Major depressive disorder, single episode, moderate: Secondary | ICD-10-CM

## 2021-11-23 ENCOUNTER — Telehealth: Payer: Self-pay | Admitting: Physician Assistant

## 2021-11-23 DIAGNOSIS — L03032 Cellulitis of left toe: Secondary | ICD-10-CM

## 2021-11-23 MED ORDER — CEPHALEXIN 500 MG PO CAPS: 500.0000 mg | ORAL_CAPSULE | Freq: Three times a day (TID) | ORAL | 0 refills | Status: AC

## 2021-11-23 NOTE — Patient Instructions (Signed)
[name], thank you for joining [name], PA-C for today's virtual visit.  While this provider is not your primary care provider (PCP), if your PCP is located in our provider database this encounter information will be shared with them immediately following your visit.  Consent: (Patient) [name] provided verbal consent for this virtual visit at the beginning of the encounter.  Current Medications:  Current Outpatient Medications:    acetaminophen (TYLENOL) 650 MG CR tablet, Take 1,300 mg by mouth daily with breakfast., Disp: , Rfl:    albuterol (VENTOLIN HFA) 108 (90 Base) MCG/ACT inhaler, INHALE 2 PUFFS INTO THE LUNGS EVERY 4 HOURS AS NEEDED FOR WHEEZING OR SHORTNESS OF BREATH., Disp: 18 each, Rfl: 0   benzonatate (TESSALON) 100 MG capsule, Take 1 capsule (100 mg total) by mouth 3 (three) times daily as needed for cough., Disp: 30 capsule, Rfl: 0   buPROPion (WELLBUTRIN) 75 MG tablet, Take 1 tablet (75 mg total) by mouth daily. (Patient not taking: Reported on 09/01/2021), Disp: 30 tablet, Rfl: 0   diclofenac (VOLTAREN) 75 MG EC tablet, Take 1 tablet (75 mg total) by mouth 2 (two) times daily. (Patient not taking: Reported on 09/01/2021), Disp: 60 tablet, Rfl: 3   doxycycline (VIBRA-TABS) 100 MG tablet, Take 1 tablet (100 mg total) by mouth 2 (two) times daily., Disp: 14 tablet, Rfl: 0   DULoxetine (CYMBALTA) 60 MG capsule, Take 1 capsule (60 mg total) by mouth 2 (two) times daily. (Patient not taking: Reported on 09/01/2021), Disp: , Rfl:    fexofenadine (ALLEGRA) 180 MG tablet, Take 1 tablet (180 mg total) by mouth daily. (Patient not taking: Reported on 09/01/2021), Disp: 90 tablet, Rfl: 3   fluticasone (FLONASE) 50 MCG/ACT nasal spray, PLACE 1-2 SPRAYS INTO BOTH NOSTRILS DAILY. (Patient not taking: Reported on 09/01/2021), Disp: 48 mL, Rfl: 4   fluticasone furoate-vilanterol (BREO ELLIPTA) 200-25 MCG/INH AEPB, Inhale 1 puff into the lungs daily., Disp: 1 each,  Rfl: 11   hydrOXYzine (ATARAX/VISTARIL) 25 MG tablet, Take 25 mg by mouth 3 (three) times daily as needed. (Patient not taking: Reported on 09/01/2021), Disp: , Rfl:    LORazepam (ATIVAN) 0.5 MG tablet, Take 1 tablet (0.5 mg total) by mouth every 8 (eight) hours as needed for anxiety., Disp: 30 tablet, Rfl: 0   promethazine-dextromethorphan (PROMETHAZINE-DM) 6.25-15 MG/5ML syrup, Take 5 mLs by mouth 2 (two) times daily as needed for cough., Disp: 118 mL, Rfl: 0   rizatriptan (MAXALT-MLT) 5 MG disintegrating tablet, Take 1 tablet (5 mg total) by mouth as needed for migraine. May repeat in 2 hours if needed (Patient not taking: Reported on 09/01/2021), Disp: 10 tablet, Rfl: 3   topiramate (TOPAMAX) 50 MG tablet, Take 1 tablet (50 mg total) by mouth daily. (Patient not taking: Reported on 09/01/2021), Disp: 90 tablet, Rfl: 3   traMADol (ULTRAM) 50 MG tablet, Take 1-2 tablets (50-100 mg total) by mouth every 8 (eight) hours as needed for moderate pain. Maximum 6 tabs per day. (Patient not taking: Reported on 09/01/2021), Disp: 21 tablet, Rfl: 0   valsartan-hydrochlorothiazide (DIOVAN-HCT) 320-25 MG tablet, TAKE 1/2 TABLET BY MOUTH EVERY DAY, Disp: 45 tablet, Rfl: 0   zolpidem (AMBIEN) 5 MG tablet, Take 1 tablet (5 mg total) by mouth at bedtime as needed for sleep., Disp: 30 tablet, Rfl: 1   Medications ordered in this encounter:  No orders of the defined types were placed in this encounter.    *If you need refills on other medications prior to  your next appointment, please contact your pharmacy*  Follow-Up: Call back or seek an in-person evaluation if the symptoms worsen or if the condition fails to improve as anticipated.  Other Instructions Please keep skin clean and dry as discussed. Start the Keflex as directed, taking a daily probiotic along with this. If symptoms are not resolving or you note new/worsening symptoms, you need an in-person evaluation.    If you have been instructed to have  an in-person evaluation today at a local Urgent Care facility, please use the link below. It will take you to a list of all of our available [facility], including address, phone number and hours of operation. Please do not delay care.  [facility]  If you or a family member do not have a primary care provider, use the link below to schedule a visit and establish care. When you choose a [facility] primary care physician or advanced practice provider, you gain a long-term partner in health. Find a Primary Care Provider  Learn more about [facility]'s in-office and virtual care options: [facility]

## 2021-11-23 NOTE — Progress Notes (Signed)
Virtual Visit Consent   Heather Salazar, you are scheduled for a virtual visit with a [facility] provider today.     Just as with appointments in the office, your consent must be obtained to participate.  Your consent will be active for this visit and any virtual visit you may have with one of our providers in the next 365 days.     If you have a MyChart account, a copy of this consent can be sent to you electronically.  All virtual visits are billed to your insurance company just like a traditional visit in the office.    As this is a virtual visit, video technology does not allow for your provider to perform a traditional examination.  This may limit your provider's ability to fully assess your condition.  If your provider identifies any concerns that need to be evaluated in person or the need to arrange testing (such as labs, EKG, etc.), we will make arrangements to do so.     Although advances in technology are sophisticated, we cannot ensure that it will always work on either your end or our end.  If the connection with a video visit is poor, the visit may have to be switched to a telephone visit.  With either a video or telephone visit, we are not always able to ensure that we have a secure connection.     I need to obtain your verbal consent now.   Are you willing to proceed with your visit today?    Heather Salazar has provided verbal consent on 11/23/2021 for a virtual visit (video or telephone).   Heather Salazar, [place]   Date: 11/23/2021 2:27 PM   Virtual Visit via Video Note   I, Heather Salazar, connected with  Heather Salazar  ([id number], 03-01-1971) on 11/23/21 at  2:15 PM EST by a video-enabled telemedicine application and verified that I am speaking with the correct person using two identifiers.  Location: Patient: Virtual Visit Location Patient: Other: Work Provider: Ecologist: Home Office   I discussed the limitations of evaluation  and management by telemedicine and the availability of in person appointments. The patient expressed understanding and agreed to proceed.    History of Present Illness: Heather Salazar is a 51 y.o. who identifies as a female who was assigned female at birth, and is being seen today for pain of medial R great toe at nail bed with redness, purulent drainage, and tenderness/pain after pulling an ingrown nail last Friday. Denies fever or chills. Tenderness and redness has increased since onset. Denies decreased ROM of toe. Has been trying to keep the toe clean and dry.  HPI: HPI  Problems:  Patient Active Problem List   Diagnosis Date Noted   Major depressive disorder, recurrent episode, moderate ([place]) 09/28/2021   Pain of left calf 04/21/2021   Retroperitoneal lymphadenopathy 12/21/2020   Stool incontinence 12/10/2020   Polyarthralgia 12/10/2020   Depression, major, single episode, moderate ([place]) 11/12/2020   Lumbar degenerative disc disease 11/12/2020   Persistent asthma without complication 66/29/4765   Smoker 11/29/2019   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   Insomnia 03/05/2019   COVID-19 12/31/2018   Transaminitis 09/14/2018   Gastro-esophageal reflux 09/13/2018   Benign essential hypertension 46/50/3546   Biliary colic 56/81/2751   Annual physical exam 01/16/2015   Polycystic ovarian syndrome 01/16/2015   Obesity (BMI 35.0-39.9 without comorbidity) 01/16/2015    Allergies:  Allergies  Allergen Reactions   Acetaminophen-Codeine Other (See Comments)  Shortness of breath. This was the codeine component, not tylenol.   Iodinated Contrast Media Rash    Felt like she was itching on the inside; needs 13-hour prep   Iodine Solution [Povidone Iodine] Rash   Oxycodone Itching   Medications:  Current Outpatient Medications:    cephALEXin (KEFLEX) 500 MG capsule, Take 1 capsule (500 mg total) by mouth 3 (three) times daily for 7 days., Disp: 21 capsule, Rfl: 0   acetaminophen  (TYLENOL) 650 MG CR tablet, Take 1,300 mg by mouth daily with breakfast., Disp: , Rfl:    albuterol (VENTOLIN HFA) 108 (90 Base) MCG/ACT inhaler, INHALE 2 PUFFS INTO THE LUNGS EVERY 4 HOURS AS NEEDED FOR WHEEZING OR SHORTNESS OF BREATH., Disp: 18 each, Rfl: 0   benzonatate (TESSALON) 100 MG capsule, Take 1 capsule (100 mg total) by mouth 3 (three) times daily as needed for cough., Disp: 30 capsule, Rfl: 0   buPROPion (WELLBUTRIN) 75 MG tablet, Take 1 tablet (75 mg total) by mouth daily. (Patient not taking: Reported on 09/01/2021), Disp: 30 tablet, Rfl: 0   diclofenac (VOLTAREN) 75 MG EC tablet, Take 1 tablet (75 mg total) by mouth 2 (two) times daily. (Patient not taking: Reported on 09/01/2021), Disp: 60 tablet, Rfl: 3   doxycycline (VIBRA-TABS) 100 MG tablet, Take 1 tablet (100 mg total) by mouth 2 (two) times daily., Disp: 14 tablet, Rfl: 0   DULoxetine (CYMBALTA) 60 MG capsule, Take 1 capsule (60 mg total) by mouth 2 (two) times daily. (Patient not taking: Reported on 09/01/2021), Disp: , Rfl:    fexofenadine (ALLEGRA) 180 MG tablet, Take 1 tablet (180 mg total) by mouth daily. (Patient not taking: Reported on 09/01/2021), Disp: 90 tablet, Rfl: 3   fluticasone (FLONASE) 50 MCG/ACT nasal spray, PLACE 1-2 SPRAYS INTO BOTH NOSTRILS DAILY. (Patient not taking: Reported on 09/01/2021), Disp: 48 mL, Rfl: 4   fluticasone furoate-vilanterol (BREO ELLIPTA) 200-25 MCG/INH AEPB, Inhale 1 puff into the lungs daily., Disp: 1 each, Rfl: 11   hydrOXYzine (ATARAX/VISTARIL) 25 MG tablet, Take 25 mg by mouth 3 (three) times daily as needed. (Patient not taking: Reported on 09/01/2021), Disp: , Rfl:    LORazepam (ATIVAN) 0.5 MG tablet, Take 1 tablet (0.5 mg total) by mouth every 8 (eight) hours as needed for anxiety., Disp: 30 tablet, Rfl: 0   promethazine-dextromethorphan (PROMETHAZINE-DM) 6.25-15 MG/5ML syrup, Take 5 mLs by mouth 2 (two) times daily as needed for cough., Disp: 118 mL, Rfl: 0   rizatriptan  (MAXALT-MLT) 5 MG disintegrating tablet, Take 1 tablet (5 mg total) by mouth as needed for migraine. May repeat in 2 hours if needed (Patient not taking: Reported on 09/01/2021), Disp: 10 tablet, Rfl: 3   topiramate (TOPAMAX) 50 MG tablet, Take 1 tablet (50 mg total) by mouth daily. (Patient not taking: Reported on 09/01/2021), Disp: 90 tablet, Rfl: 3   traMADol (ULTRAM) 50 MG tablet, Take 1-2 tablets (50-100 mg total) by mouth every 8 (eight) hours as needed for moderate pain. Maximum 6 tabs per day. (Patient not taking: Reported on 09/01/2021), Disp: 21 tablet, Rfl: 0   valsartan-hydrochlorothiazide (DIOVAN-HCT) 320-25 MG tablet, TAKE 1/2 TABLET BY MOUTH EVERY DAY, Disp: 45 tablet, Rfl: 0   zolpidem (AMBIEN) 5 MG tablet, Take 1 tablet (5 mg total) by mouth at bedtime as needed for sleep., Disp: 30 tablet, Rfl: 1  Observations/Objective: Patient is well-developed, well-nourished in no acute distress.  Resting comfortably at home.  Head is normocephalic, atraumatic.  No labored breathing. Speech is  clear and coherent with logical content.  Patient is alert and oriented at baseline.  R medial toe examined -- erythema and drainage noted of medial aspect of nail bed. Erythema also progressing to whole distal aspect of great toe.   Assessment and Plan: 1. Paronychia of great toe of left foot - cephALEXin (KEFLEX) 500 MG capsule; Take 1 capsule (500 mg total) by mouth 3 (three) times daily for 7 days.  Dispense: 21 capsule; Refill: 0  With start of mild cellulitis. Occurring after pulling ingrown nail at end of last week. No alarm signs or symptoms present. Will start Keflex TID x 7 days. Supportive measures and foot soaks, care discussed in detail. Strict in-person follow-up precautions reviewed.   Follow Up Instructions: I discussed the assessment and treatment plan with the patient. The patient was provided an opportunity to ask questions and all were answered. The patient agreed with the plan  and demonstrated an understanding of the instructions.  A copy of instructions were sent to the patient via MyChart unless otherwise noted below.   The patient was advised to call back or seek an in-person evaluation if the symptoms worsen or if the condition fails to improve as anticipated.  Time:  I spent 10 minutes with the patient via telehealth technology discussing the above problems/concerns.    Heather Rio, PA-C

## 2021-11-29 ENCOUNTER — Ambulatory Visit (INDEPENDENT_AMBULATORY_CARE_PROVIDER_SITE_OTHER): Payer: 59 | Admitting: Professional

## 2021-11-29 ENCOUNTER — Encounter: Payer: Self-pay | Admitting: Professional

## 2021-11-29 DIAGNOSIS — F331 Major depressive disorder, recurrent, moderate: Secondary | ICD-10-CM | POA: Diagnosis not present

## 2021-11-29 NOTE — Progress Notes (Signed)
[facility] Counselor/Therapist Progress Note  Patient ID: Heather Salazar, MRN: [id number],    Date: 11/29/2021  Time Spent: 30 minutes 101-131 pm  Treatment Type: Individual Therapy  Reported Symptoms: powerful  Mental Status Exam: Appearance:  Well Groomed     Behavior: Appropriate and Sharing  Motor: Normal  Speech/Language:  Clear and Coherent and Normal Rate  Affect: Full Range  Mood: normal  Thought process: goal directed  Thought content:   WNL  Sensory/Perceptual disturbances:   WNL  Orientation: oriented to person, place, time/date, and situation  Attention: Good  Concentration: Good  Memory: WNL  Fund of knowledge:  Good  Insight:   Good  Judgment:  Good  Impulse Control: Good   Risk Assessment: Danger to Self:  No Self-injurious Behavior: No Danger to Others: No Duty to Warn:no Physical Aggression / Violence:No  Access to Firearms a concern: No  Gang Involvement:No   Subjective: This session was held via video teletherapy due to the coronavirus risk at this time. The patient consented to video teletherapy and was located at her home during this session. She is aware it is the responsibility of the patient to secure confidentiality on her end of the session. The provider was in a private home office for the duration of this session.  The patient arrived on time for her webex session.  Issues addressed: 1- professional -had difficulty getting one day approved as a furlough day to assist younger daughter who was having an intrusive surgery -pt was told by supervisor in casual conversation that her request was not approved; pt went away and thought about the situation -pt woke up the next day and sent an email to the county attorney and shared the information   -he responded and forwarded her request to another counsel -meeting called with director who reported that she had never heard the request   -pt apologized for going over her head    -pt also learned a number of things that were said to her -her boss pulled her the next day   -called her out   -pt listened and did not respond   -meeting ended at at end of day boss pulled her back in   -took a lot out of her and she needed for that to occur   -her boss has been responding to her differently -the atmosphere feels a little different -her boss emails and follows up in writing -pt admits that she had to breath and really work her way through the process   -"I thought before I spoke"   -she told them she needed to stand up for what was important   -she admitted that until that time she avoided and suppressed -pt has been using her experience to advocate for themselves and their staff 2-oldest daughter -pt continues to keep her distance -pt did not feel like handling her   -was in a peaceful restful state -pt can tell she is going through something   -her partner just had a baby with another woman 3-self-care -pt is spending her time and investing in things that she enjoys -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 Develop a realistic perspective regarding demands and obligations of multiple roles and their completion. 2. Develop a support system to assist with multiple roles and responsibilities. Objective Generate a list of self-care activities and make a commitment  to regularly participate in such activities. Target Date: 2022-03-08 Frequency: Monthly  Progress: 75 Modality: individual  Objective Identify at least five practical ways to manage stress associated with multiple demands. Target Date: 2022-03-08 Frequency: Monthly  Progress: 80 Modality: individual  Objective Learn and implement stress management and relaxation techniques to reduce fatigue, anxiety, and depressive symptoms. Target Date:  2022-03-08 Frequency: Monthly  Progress: 90 Modality: individual  Objective Implement assertiveness skills and limit setting. Target Date: 2022-03-08 Frequency: Monthly  Progress: 90 Modality: individual  Objective Identify and replace internalized expectations and standards regarding multiple roles that are potentially harmful. Target Date: 2022-03-08 Frequency: Monthly  Progress: 100 Modality: individual  3. Eliminate depressive and anxiety symptoms associated with trying to balance multiple roles. 4. Enhance ability to effectively handle life stressors. 5. Identify and increase intrapersonal, interpersonal, and physical resources to foster positive coping strategies. 6. Improve depressed mood to maximize effective social, occupational, and physical functioning. 7. Improve self-esteem and develop a positive self-image as capable and competent. Objective Identify and strengthen connections to others who affirm self-esteem and respect one's assertiveness. Target Date: 2022-03-08 Frequency: Monthly  Progress: 90 Modality: individual  Related Interventions Assess the client's beliefs about appropriate gender-role behaviors for women, as well as any feelings of role conflict that she may be experiencing. Collaborate with the client to develop concrete short- and long-term goals to decrease depression, considering personal and interpersonal advantages associated with goals and treatment needs; review goals periodically. Use behavioral techniques (i.e., education, modeling, role-playing, corrective feedback, positive reinforcement) to teach the couple problem-solving and conflict-resolution skills, including defining the problem constructively and specifically, brainstorming options, evaluating options, compromise, choosing options and implementing a plan, and evaluating the results. Assist the client with identifying those expectations that are helpful and those that are potentially harmful; help  the client discard those that are causing undue stress and strain and replace them with more realistic messages. Assign the couple a homework exercise to use and record newly learned problem-solving and conflict-resolution skills (or assign "Applying Problem-Solving to Interpersonal Conflict" in the Adult Psychotherapy Homework Planner, 2nd ed. by Bryn Gulling); process results in session. Explore with the client her expectations regarding the balance between work and family, and discuss how this relates to internalized gender role stereotypes. Educate the client on thought-stopping techniques and positive reframing in order to preempt anxious reactions. Identify unresolved aspects of prior traumatic events (e.g., rape, witnessing/experiencing extreme violence, natural disaster) and help the client to resolve them. Offer suggestions as to possible activities or causes to which the client can devote her time and expertise. Aid the client in seeing the connection between a focus on helping others or improving society and a reduction in anxiety (i.e., dwelling on maladaptive thoughts). Explore areas of competency with the client and encourage her to engage in related activities (e.g., dance, singing, arts, volunteer work, joining a book club). Explore the client's fears of being assertive (e.g., fear of rejection, fear of ineffectiveness, fear of ridicule). Teach the client three coping skills (e.g., deep-breathing exercises, muscle-relaxation techniques, stress management, meditation, and guided imagery) to combat the physiological cues of anxiety. Validate the client's feelings and encourage her attempts to listen to herself; reinforce experiences of asserting her rights and feelings. Objective Practice saying "no" to at least one person, declining to comply with a request/favor or identify and assert rights, needs, and wants. Target Date: 2022-03-08 Frequency: Monthly  Progress: 100 Modality: individual   Objective Identify three roadblocks to improved self-esteem. Target Date: 2022-03-08 Frequency:  Monthly  Progress: 90 Modality: individual  Objective Verbalize an understanding of the differences between passive, assertive, and aggressive behavior. Target Date: 2022-03-08 Frequency: Monthly  Progress: 90 Modality: individual  Objective Decrease the frequency of making self-disparaging remarks. Target Date: 2022-03-08 Frequency: Monthly  Progress: 0 Modality: individual  Related Interventions Ask the client to make a list including positive qualities about herself and accomplishments; verbally reinforce positive self-statements. Assist the client in developing a list of positive affirmations to be read three times a day or to be put on a mirror at home. Encourage the client to make at least one positive statement about herself during the therapy session. 8. Increase ability to express needs and desires openly and honestly. 9. Increase assertiveness skills and ability to advocate for self. 10. Recognize the role of precipitating factors such as social oppression, gender roles, prior trauma, and learned behavior in the perpetuation of anxiety. Objective Work through any unresolved aspects of prior traumatic events and understand the impact of trauma on anxiety reactions. Target Date: 2022-03-08 Frequency: Monthly  Progress: 16 Modality: individual  Objective Verbalize the degree to which current life circumstances could be improved in areas such as work, home, and school. Target Date: 2022-03-08 Frequency: Monthly  Progress: 0 Modality: individual  Objective Begin to refer to an internal standard of performance, morals, achievement, appearance, and so on, rather than sociocultural, parental, spousal, or otherwise externally imposed judgments. Target Date: 2022-03-08 Frequency: Monthly  Progress: 100 Modality: individual  Objective Develop the ability to determine when the use of  anxiety-reduction techniques is necessary and how to use them effectively in 9 out of 10 anxiety-arousing situations. Target Date: 2022-03-08 Frequency: Monthly  Progress: 90 Modality: individual  Objective Identify precipitating events, thoughts, feelings, and reactions that are believed to contribute to anxiety. Target Date: 2022-03-08 Frequency: Monthly  Progress: 90 Modality: individual  Related Interventions Aid the client in devising a healthy diet and eating schedule, a pattern of adequate sleep and relaxation, as well as regular cardiovascular exercise. 11. Resolve the core conflict that is the source of anxiety. 12. Understand the relationships among women's roles, gender socialization, cultural background, and depression. Objective Articulate self-care methods to prevent and/or manage depression in the future; create a list of 10 coping methods for future use. Target Date: 2022-03-08 Frequency: Monthly  Progress: 90 Modality: individual  Objective Implement behavioral interventions to overcome depression. Target Date: 2022-03-08 Frequency: Monthly  Progress: 90 Modality: individual  Related Interventions Encourage the client to continue participation in positive social support systems. Assist the client to consider upcoming stressors; brainstorm with her 10 ways to mitigate depression in the future. Objective Increase the frequency of engaging in pleasant activities. Target Date: 2022-03-08 Frequency: Monthly  Progress: 90 Modality: individual  Related Interventions Monitor and encourage the client to attend to personal grooming and healthy sleeping and eating patterns; reinforce improvement. Objective Identify and replace cognitive self-talk that supports depression. Target Date: 2022-03-08 Frequency: Monthly  Progress: 100 Modality: individual  Related Interventions Encourage the client to discuss cognitive distortions, including automatic thoughts (e.g., negative view of  self, future, experience) and negative schemas (e.g., core beliefs about self and others based on earlier childhood experiences); assess frequency of negative self-statements associated with depression.  Diagnosis:Major depressive disorder, recurrent episode, moderate ([place])  Plan:  -meet again on Tuesday, December 28, 2021 at 10 am  Francie Massing, [facility]

## 2021-12-28 ENCOUNTER — Ambulatory Visit (INDEPENDENT_AMBULATORY_CARE_PROVIDER_SITE_OTHER): Payer: 59 | Admitting: Professional

## 2021-12-28 ENCOUNTER — Encounter: Payer: Self-pay | Admitting: Professional

## 2021-12-28 DIAGNOSIS — F331 Major depressive disorder, recurrent, moderate: Secondary | ICD-10-CM

## 2021-12-28 NOTE — Progress Notes (Signed)
[facility] Counselor/Therapist Progress Note ? ?Patient ID: Heather Salazar, MRN: [id number],   ? ?Date: 12/28/2021 ? ?Time Spent: 30 minutes 07-1029 am ? ?Treatment Type: Individual Therapy ? ?Risk Assessment: ?Danger to Self:  No ?Self-injurious Behavior: No ?Danger to Others: No ?Duty to Warn:no ? ?Subjective: This session was held via video teletherapy due to the coronavirus risk at this time. The patient consented to video teletherapy and was located at her home during this session. She is aware it is the responsibility of the patient to secure confidentiality on her end of the session. The provider was in a private home office for the duration of this session. ? ?The patient arrived on time for her webex session. ? ?Issues addressed: ?1- mood ?-has generally been doing well ?-depressed after learning of death of belove family member, laid in bed all day yesterday ?-has not eaten ?-fearful of depression returning ?-pt admits in the moment if helps but it didn't go anywhere and it is here today ?2-grief ?-daughter's grandfather  Mikeal Hawthorne died ?  -they visited on December 21, 2022 and Sunday ?  -pt was close to her daughter's grandparents ?-pt taking his death very hard ?-admits that it call up for her a lot of losses ?  -mother ?  -brother ?  -cousin ?  -best friend at work ?-misses her mother's wisdom ?-pt feels she doesn't have a type of comfort that she used to have ?-pt went over early Dec 21, 2022 to see Mikeal Hawthorne ?-pt's daughter wanted her to come over ?  -initially she did not really want to go until she heard her daughter speak ?    -"it was like my 51 year old again" ? ?Problems Addressed  ?Anxiety, Balancing Work and Family/Multiple Roles, Depression, Low Self-Esteem/Lack of Assertiveness  ?Goals ?1. Develop a realistic perspective regarding demands and obligations of multiple roles and their completion. ?2. Develop a support system to assist with multiple roles and responsibilities. ?Objective ?Generate  a list of self-care activities and make a commitment to regularly participate in such activities. ?Target Date: 2022-03-08 Frequency: Monthly  ?Progress: 80 Modality: individual  ?Objective ?Identify at least five practical ways to manage stress associated with multiple demands. ?Target Date: 2022-03-08 Frequency: Monthly  ?Progress: 80 Modality: individual  ?Objective ?Learn and implement stress management and relaxation techniques to reduce fatigue, anxiety, and depressive symptoms. ?Target Date: 2022-03-08 Frequency: Monthly  ?Progress: 90 Modality: individual  ?Objective ?Implement assertiveness skills and limit setting. ?Target Date: 2022-03-08 Frequency: Monthly  ?Progress: 90 Modality: individual  ?Objective ?Identify and replace internalized expectations and standards regarding multiple roles that are potentially harmful. ?Target Date: 2022-03-08 Frequency: Monthly  ?Progress: 100 Modality: individual  ?3. Eliminate depressive and anxiety symptoms associated with trying to balance multiple roles. ?4. Enhance ability to effectively handle life stressors. ?5. Identify and increase intrapersonal, interpersonal, and physical resources to foster positive coping strategies. ?6. Improve depressed mood to maximize effective social, occupational, and physical functioning. ?7. Improve self-esteem and develop a positive self-image as capable and competent. ?Objective ?Identify and strengthen connections to others who affirm self-esteem and respect one's assertiveness. ?Target Date: 2022-03-08 Frequency: Monthly  ?Progress: 90 Modality: individual  ?Related Interventions ?Assess the client's beliefs about appropriate gender-role behaviors for women, as well as any feelings of role conflict that she may be experiencing. ?Collaborate with the client to develop concrete short- and long-term goals to decrease depression, considering personal and interpersonal advantages associated with goals and treatment needs; review  goals periodically. ?Use behavioral techniques (i.e., education,  modeling, role-playing, corrective feedback, positive reinforcement) to teach the couple problem-solving and conflict-resolution skills, including defining the problem constructively and specifically, brainstorming options, evaluating options, compromise, choosing options and implementing a plan, and evaluating the results. ?Assist the client with identifying those expectations that are helpful and those that are potentially harmful; help the client discard those that are causing undue stress and strain and replace them with more realistic messages. ?Assign the couple a homework exercise to use and record newly learned problem-solving and conflict-resolution skills (or assign "Applying Problem-Solving to Interpersonal Conflict" in the Adult Psychotherapy Homework Planner, 2nd ed. by Bryn Gulling); process results in session. ?Explore with the client her expectations regarding the balance between work and family, and discuss how this relates to internalized gender role stereotypes. ?Educate the client on thought-stopping techniques and positive reframing in order to preempt anxious reactions. ?Identify unresolved aspects of prior traumatic events (e.g., rape, witnessing/experiencing extreme violence, natural disaster) and help the client to resolve them. ?Offer suggestions as to possible activities or causes to which the client can devote her time and expertise. ?Aid the client in seeing the connection between a focus on helping others or improving society and a reduction in anxiety (i.e., dwelling on maladaptive thoughts). ?Explore areas of competency with the client and encourage her to engage in related activities (e.g., dance, singing, arts, volunteer work, joining a book club). ?Explore the client's fears of being assertive (e.g., fear of rejection, fear of ineffectiveness, fear of ridicule). ?Teach the client three coping skills (e.g., deep-breathing  exercises, muscle-relaxation techniques, stress management, meditation, and guided imagery) to combat the physiological cues of anxiety. ?Validate the client's feelings and encourage her attempts to listen to herself; reinforce experiences of asserting her rights and feelings. ?Objective ?Practice saying "no" to at least one person, declining to comply with a request/favor or identify and assert rights, needs, and wants. ?Target Date: 2022-03-08 Frequency: Monthly  ?Progress: 100 Modality: individual  ?Objective ?Identify three roadblocks to improved self-esteem. ?Target Date: 2022-03-08 Frequency: Monthly  ?Progress: 90 Modality: individual  ?Objective ?Verbalize an understanding of the differences between passive, assertive, and aggressive behavior. ?Target Date: 2022-03-08 Frequency: Monthly  ?Progress: 90 Modality: individual  ?Objective ?Decrease the frequency of making self-disparaging remarks. ?Target Date: 2022-03-08 Frequency: Monthly  ?Progress: 0 Modality: individual  ?Related Interventions ?Ask the client to make a list including positive qualities about herself and accomplishments; verbally reinforce positive self-statements. ?Assist the client in developing a list of positive affirmations to be read three times a day or to be put on a mirror at home. ?Encourage the client to make at least one positive statement about herself during the therapy session. ?8. Increase ability to express needs and desires openly and honestly. ?9. Increase assertiveness skills and ability to advocate for self. ?10. Recognize the role of precipitating factors such as social oppression, gender roles, prior trauma, and learned behavior in the perpetuation of anxiety. ?Objective ?Work through any unresolved aspects of prior traumatic events and understand the impact of trauma on anxiety reactions. ?Target Date: 2022-03-08 Frequency: Monthly  ?Progress: 80 Modality: individual  ?Objective ?Verbalize the degree to which current  life circumstances could be improved in areas such as work, home, and school. ?Target Date: 2022-03-08 Frequency: Monthly  ?Progress: 0 Modality: individual  ?Objective ?Begin to refer to an internal stand

## 2022-01-13 ENCOUNTER — Ambulatory Visit: Payer: 59 | Admitting: Professional

## 2022-01-25 ENCOUNTER — Encounter: Payer: Self-pay | Admitting: Professional

## 2022-01-25 ENCOUNTER — Ambulatory Visit (INDEPENDENT_AMBULATORY_CARE_PROVIDER_SITE_OTHER): Payer: 59 | Admitting: Professional

## 2022-01-25 DIAGNOSIS — F331 Major depressive disorder, recurrent, moderate: Secondary | ICD-10-CM

## 2022-01-25 NOTE — Progress Notes (Signed)
[facility] Counselor/Therapist Progress Note ? ?Patient ID: Heather Salazar, MRN: [id number],   ? ?Date: 01/25/2022 ? ?Time Spent: 39 minutes 1-139pm ? ?Treatment Type: Individual Therapy ? ?Risk Assessment: ?Danger to Self:  No ?Self-injurious Behavior: No ?Danger to Others: No ?Duty to Warn:no ? ?Subjective: This session was held via video teletherapy due to the coronavirus risk at this time. The patient consented to video teletherapy and was located at her home during this session. She is aware it is the responsibility of the patient to secure confidentiality on her end of the session. The provider was in a private home office for the duration of this session. ? ?The patient arrived on time for her webex session. ? ?Issues addressed: ?1- mood ?-pretty good mood ?-concerned about one of her direct reports ?-not as stressed as she would usually be ?2-grief ?-she chose not to attend funeral ?  -her daughter was okay with her not being there ?  -she felt better after moving past the funeral date ?-reports she is feeling less consumed with thinking about her losses ?3-professional ?-she ws going to retire in August and then moved it to December ?-she talked with her brother who is wise financially ?  -he reminded her that she would be fine until she got bored ?  -he reminded her there is no going back to the county after she is gone ?  -he did not want her to leave money on the table ?  -he reminded her to slow down in her job and do her work ?    -she can say no ?    -she ws not hired to do the boss' work ?-pt admits she is in a better space and no longer wants to get out ?-pt is only six weeks from being able to retire ?  -she is aware of her ability to have control ?  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  -she uses her DSS friend's death having left 1800 of sick leave on the table ?-pt identifies that helping other people is her passion ?4-pt's daughter in  [place] ?-called her and thanked her for teaching her to be responsible ?-has been on a roller coaster ?-loves her job at the [employer] ?-she keeps hearing her daughter say she wants "him" (her boyfriend) to leave ?  -pt confronted her on her responsibility in being able to leave herself ?5-socially ?-she is physically showing up for church ?-she is glad to be getting back to being involved ?-she is investing in things she used to do ?6-how healthy are you ?-mentally-only about 15% away from her best self ?-the art of "no" ?-understanding communication with people ?-setting boundaries ?-no assuming the worst ?-wants to increase her physical health ?-she talked with health coach about getting her [place] right before her physical health ?  -she wants to stat a 15 minute walk with her friend ?-pt admits to sabotaging stuff in her life, especially relationships ?  -"I'm always looking for the worst in people" ?-she has added the word commitment to her mirror so she can see it everyday ?-patient is eating better ? ?Problems Addressed  ?Anxiety, Balancing Work and Family/Multiple Roles, Depression, Low Self-Esteem/Lack of Assertiveness  ?Goals ?1. Develop a realistic perspective regarding demands and obligations of multiple roles and their completion. ?2. Develop a support system to assist with multiple roles and responsibilities. ?Objective ?Generate a list of self-care activities and make a commitment to regularly  participate in such activities. ?Target Date: 2022-03-08 Frequency: Monthly  ?Progress: 80 Modality: individual  ?Objective ?Identify at least five practical ways to manage stress associated with multiple demands. ?Target Date: 2022-03-08 Frequency: Monthly  ?Progress: 80 Modality: individual  ?Objective ?Learn and implement stress management and relaxation techniques to reduce fatigue, anxiety, and depressive symptoms. ?Target Date: 2022-03-08 Frequency: Monthly  ?Progress: 90 Modality: individual   ?Objective ?Implement assertiveness skills and limit setting. ?Target Date: 2022-03-08 Frequency: Monthly  ?Progress: 90 Modality: individual  ?Objective ?Identify and replace internalized expectations and standards regarding multiple roles that are potentially harmful. ?Target Date: 2022-03-08 Frequency: Monthly  ?Progress: 100 Modality: individual  ?3. Eliminate depressive and anxiety symptoms associated with trying to balance multiple roles. ?4. Enhance ability to effectively handle life stressors. ?5. Identify and increase intrapersonal, interpersonal, and physical resources to foster positive coping strategies. ?6. Improve depressed mood to maximize effective social, occupational, and physical functioning. ?7. Improve self-esteem and develop a positive self-image as capable and competent. ?Objective ?Identify and strengthen connections to others who affirm self-esteem and respect one's assertiveness. ?Target Date: 2022-03-08 Frequency: Monthly  ?Progress: 90 Modality: individual  ?Related Interventions ?Assess the client's beliefs about appropriate gender-role behaviors for women, as well as any feelings of role conflict that she may be experiencing. ?Collaborate with the client to develop concrete short- and long-term goals to decrease depression, considering personal and interpersonal advantages associated with goals and treatment needs; review goals periodically. ?Use behavioral techniques (i.e., education, modeling, role-playing, corrective feedback, positive reinforcement) to teach the couple problem-solving and conflict-resolution skills, including defining the problem constructively and specifically, brainstorming options, evaluating options, compromise, choosing options and implementing a plan, and evaluating the results. ?Assist the client with identifying those expectations that are helpful and those that are potentially harmful; help the client discard those that are causing undue stress and strain  and replace them with more realistic messages. ?Assign the couple a homework exercise to use and record newly learned problem-solving and conflict-resolution skills (or assign "Applying Problem-Solving to Interpersonal Conflict" in the Adult Psychotherapy Homework Planner, 2nd ed. by Bryn Gulling); process results in session. ?Explore with the client her expectations regarding the balance between work and family, and discuss how this relates to internalized gender role stereotypes. ?Educate the client on thought-stopping techniques and positive reframing in order to preempt anxious reactions. ?Identify unresolved aspects of prior traumatic events (e.g., rape, witnessing/experiencing extreme violence, natural disaster) and help the client to resolve them. ?Offer suggestions as to possible activities or causes to which the client can devote her time and expertise. ?Aid the client in seeing the connection between a focus on helping others or improving society and a reduction in anxiety (i.e., dwelling on maladaptive thoughts). ?Explore areas of competency with the client and encourage her to engage in related activities (e.g., dance, singing, arts, volunteer work, joining a book club). ?Explore the client's fears of being assertive (e.g., fear of rejection, fear of ineffectiveness, fear of ridicule). ?Teach the client three coping skills (e.g., deep-breathing exercises, muscle-relaxation techniques, stress management, meditation, and guided imagery) to combat the physiological cues of anxiety. ?Validate the client's feelings and encourage her attempts to listen to herself; reinforce experiences of asserting her rights and feelings. ?Objective ?Practice saying "no" to at least one person, declining to comply with a request/favor or identify and assert rights, needs, and wants. ?Target Date: 2022-03-08 Frequency: Monthly  ?Progress: 100 Modality: individual  ?Objective ?Identify three roadblocks to improved  self-esteem. ?Target Date: 2022-03-08 Frequency: Monthly  ?  Progress: 90 Modality: individual  ?Objective ?Verbalize an understanding of the differences between passive, assertive, and aggressive behavior. ?Target Date: 2022-03-08 F

## 2022-02-23 ENCOUNTER — Ambulatory Visit (INDEPENDENT_AMBULATORY_CARE_PROVIDER_SITE_OTHER): Payer: 59 | Admitting: Professional

## 2022-02-23 ENCOUNTER — Encounter: Payer: Self-pay | Admitting: Professional

## 2022-02-23 DIAGNOSIS — F331 Major depressive disorder, recurrent, moderate: Secondary | ICD-10-CM | POA: Diagnosis not present

## 2022-02-23 NOTE — Progress Notes (Signed)
[facility] Counselor/Therapist Progress Note ? ?Patient ID: Heather Salazar, MRN: [id number],   ? ?Date: 02/23/2022 ? ?Time Spent: 31 minutes 201-232pm ? ?Treatment Type: Individual Therapy ? ?Risk Assessment: ?Danger to Self:  No ?Self-injurious Behavior: No ?Danger to Others: No ?Duty to Warn:no ? ?Subjective: This session was held via video teletherapy. The patient consented to video teletherapy and was located at her home during this session. She is aware it is the responsibility of the patient to secure confidentiality on her end of the session. The provider was in a private home office for the duration of this session. ? ?The patient arrived on time for her webex session. ? ?Issues addressed: ?1- mood ?-pretty steady, feeling better and feels good overall ?2-professional ?-the week has been a challenge ?-she has been voluntold and was not happy ?-some conflict with supervisor that is making her suspicious ?  -boss has showed up at meetings she was not invited ?-avoid stressing about things you have no control over ?3-personal ?-she is not dating ?-is so tired when she gets home ?-has been busy planning her father's father day picnic ?  -she has been looking through pictures that have bee good memories ?  -enjoyed things that were simple in life ?-pt has decided to not drink anything in the past two months ?  -she had a shot of wine with a girlfriend ?  -when she had although about enjoying a drink she diverted to do something more productive ?-she is sleeping 8-9 hour per night ?  -she notices when sleeping less she feels moody ?4-PCP appointment tomorrow ?-pt initiated an appointment with Dr. Darene Lamer for weight management ?-she notices she has picked up a lot of weight since she began taking medication ?-healthy eating ? ?Problems Addressed  ?Anxiety, Balancing Work and Family/Multiple Roles, Depression, Low Self-Esteem/Lack of Assertiveness  ?Goals ?1. Develop a realistic perspective regarding  demands and obligations of multiple roles and their completion. ?2. Develop a support system to assist with multiple roles and responsibilities. ?Objective ?Generate a list of self-care activities and make a commitment to regularly participate in such activities. ?Target Date: 2022-03-08 Frequency: Monthly  ?Progress: 80 Modality: individual  ?Objective ?Identify at least five practical ways to manage stress associated with multiple demands. ?Target Date: 2022-03-08 Frequency: Monthly  ?Progress: 80 Modality: individual  ?Objective ?Learn and implement stress management and relaxation techniques to reduce fatigue, anxiety, and depressive symptoms. ?Target Date: 2022-03-08 Frequency: Monthly  ?Progress: 90 Modality: individual  ?Objective ?Implement assertiveness skills and limit setting. ?Target Date: 2022-03-08 Frequency: Monthly  ?Progress: 90 Modality: individual  ?Objective ?Identify and replace internalized expectations and standards regarding multiple roles that are potentially harmful. ?Target Date: 2022-03-08 Frequency: Monthly  ?Progress: 100 Modality: individual  ?3. Eliminate depressive and anxiety symptoms associated with trying to balance multiple roles. ?4. Enhance ability to effectively handle life stressors. ?5. Identify and increase intrapersonal, interpersonal, and physical resources to foster positive coping strategies. ?6. Improve depressed mood to maximize effective social, occupational, and physical functioning. ?7. Improve self-esteem and develop a positive self-image as capable and competent. ?Objective ?Identify and strengthen connections to others who affirm self-esteem and respect one's assertiveness. ?Target Date: 2022-03-08 Frequency: Monthly  ?Progress: 90 Modality: individual  ?Related Interventions ?Assess the client's beliefs about appropriate gender-role behaviors for women, as well as any feelings of role conflict that she may be experiencing. ?Collaborate with the client to  develop concrete short- and long-term goals to decrease depression, considering personal and interpersonal advantages  associated with goals and treatment needs; review goals periodically. ?Use behavioral techniques (i.e., education, modeling, role-playing, corrective feedback, positive reinforcement) to teach the couple problem-solving and conflict-resolution skills, including defining the problem constructively and specifically, brainstorming options, evaluating options, compromise, choosing options and implementing a plan, and evaluating the results. ?Assist the client with identifying those expectations that are helpful and those that are potentially harmful; help the client discard those that are causing undue stress and strain and replace them with more realistic messages. ?Assign the couple a homework exercise to use and record newly learned problem-solving and conflict-resolution skills (or assign "Applying Problem-Solving to Interpersonal Conflict" in the Adult Psychotherapy Homework Planner, 2nd ed. by Bryn Gulling); process results in session. ?Explore with the client her expectations regarding the balance between work and family, and discuss how this relates to internalized gender role stereotypes. ?Educate the client on thought-stopping techniques and positive reframing in order to preempt anxious reactions. ?Identify unresolved aspects of prior traumatic events (e.g., rape, witnessing/experiencing extreme violence, natural disaster) and help the client to resolve them. ?Offer suggestions as to possible activities or causes to which the client can devote her time and expertise. ?Aid the client in seeing the connection between a focus on helping others or improving society and a reduction in anxiety (i.e., dwelling on maladaptive thoughts). ?Explore areas of competency with the client and encourage her to engage in related activities (e.g., dance, singing, arts, volunteer work, joining a book club). ?Explore  the client's fears of being assertive (e.g., fear of rejection, fear of ineffectiveness, fear of ridicule). ?Teach the client three coping skills (e.g., deep-breathing exercises, muscle-relaxation techniques, stress management, meditation, and guided imagery) to combat the physiological cues of anxiety. ?Validate the client's feelings and encourage her attempts to listen to herself; reinforce experiences of asserting her rights and feelings. ?Objective ?Practice saying "no" to at least one person, declining to comply with a request/favor or identify and assert rights, needs, and wants. ?Target Date: 2022-03-08 Frequency: Monthly  ?Progress: 100 Modality: individual  ?Objective ?Identify three roadblocks to improved self-esteem. ?Target Date: 2022-03-08 Frequency: Monthly  ?Progress: 90 Modality: individual  ?Objective ?Verbalize an understanding of the differences between passive, assertive, and aggressive behavior. ?Target Date: 2022-03-08 Frequency: Monthly  ?Progress: 90 Modality: individual  ?Objective ?Decrease the frequency of making self-disparaging remarks. ?Target Date: 2022-03-08 Frequency: Monthly  ?Progress: 0 Modality: individual  ?Related Interventions ?Ask the client to make a list including positive qualities about herself and accomplishments; verbally reinforce positive self-statements. ?Assist the client in developing a list of positive affirmations to be read three times a day or to be put on a mirror at home. ?Encourage the client to make at least one positive statement about herself during the therapy session. ?8. Increase ability to express needs and desires openly and honestly. ?9. Increase assertiveness skills and ability to advocate for self. ?10. Recognize the role of precipitating factors such as social oppression, gender roles, prior trauma, and learned behavior in the perpetuation of anxiety. ?Objective ?Work through any unresolved aspects of prior traumatic events and understand the  impact of trauma on anxiety reactions. ?Target Date: 2022-03-08 Frequency: Monthly  ?Progress: 80 Modality: individual  ?Objective ?Verbalize the degree to which current life circumstances could be improved in are

## 2022-02-24 ENCOUNTER — Ambulatory Visit (INDEPENDENT_AMBULATORY_CARE_PROVIDER_SITE_OTHER): Payer: Managed Care, Other (non HMO) | Admitting: Sports Medicine

## 2022-02-24 ENCOUNTER — Encounter: Payer: Self-pay | Admitting: Sports Medicine

## 2022-02-24 VITALS — BP 121/78 | HR 91 | Ht 65.0 in | Wt 255.0 lb

## 2022-02-24 DIAGNOSIS — F321 Major depressive disorder, single episode, moderate: Secondary | ICD-10-CM

## 2022-02-24 DIAGNOSIS — I10 Essential (primary) hypertension: Secondary | ICD-10-CM

## 2022-02-24 DIAGNOSIS — Z23 Encounter for immunization: Secondary | ICD-10-CM

## 2022-02-24 DIAGNOSIS — E669 Obesity, unspecified: Secondary | ICD-10-CM | POA: Diagnosis not present

## 2022-02-24 DIAGNOSIS — Z0000 Encounter for general adult medical examination without abnormal findings: Secondary | ICD-10-CM | POA: Diagnosis not present

## 2022-02-24 MED ORDER — ONDANSETRON 8 MG PO TBDP: 8.0000 mg | ORAL_TABLET | Freq: Three times a day (TID) | ORAL | 3 refills | Status: DC | PRN

## 2022-02-24 MED ORDER — WEGOVY 0.25 MG/0.5ML ~~LOC~~ SOAJ: 0.2500 mg | SUBCUTANEOUS | 0 refills | Status: DC

## 2022-02-24 NOTE — Assessment & Plan Note (Signed)
Obesity, she will be part of the multidisciplinary weight loss program with calorie counting and an exercise prescription. Adding Wegovy, will do a 1 month tolerability follow-up, adding Zofran.

## 2022-02-24 NOTE — Addendum Note (Signed)
Addended by: Dema Severin on: 02/24/2022 11:24 AM   Modules accepted: Orders

## 2022-02-24 NOTE — Progress Notes (Signed)
Subjective:    CC: Annual Physical Exam  HPI:  This patient is here for their annual physical  I reviewed the past medical history, family history, social history, surgical history, and allergies today and no changes were needed.  Please see the problem list section below in epic for further details.  Past Medical History: Past Medical History:  Diagnosis Date   Anxiety    Arthritis    back    Asthma    childhood   COVID-19    x3   Depression    GERD (gastroesophageal reflux disease)    History of colon polyps    Hypertension    PTSD (post-traumatic stress disorder)    Past Surgical History: Past Surgical History:  Procedure Laterality Date   CESAREAN SECTION     CHOLECYSTECTOMY N/A 03/06/2018   Procedure: LAPAROSCOPIC CHOLECYSTECTOMY ERAS PATHWAY;  Surgeon: Clovis Riley, MD;  Location: [facility];  Service: General;  Laterality: N/A;   COLONOSCOPY     In [place] [phone] did have a small polyp   tubaligation     Social History: Social History   Socioeconomic History   Marital status: Divorced    Spouse name: Not on file   Number of children: 2   Years of education: Not on file   Highest education level: Associate degree: academic program  Occupational History   Not on file  Tobacco Use   Smoking status: Former    Packs/day: 0.50    Types: Cigarettes    Quit date: 04/2021    Years since quitting: 0.8   Smokeless tobacco: Never  Vaping Use   Vaping Use: Never used  Substance and Sexual Activity   Alcohol use: Yes    Comment: weekends    Drug use: No   Sexual activity: Not on file  Other Topics Concern   Not on file  Social History Narrative   Not on file   Social Determinants of Health   Financial Resource Strain: Not on file  Food Insecurity: Not on file  Transportation Needs: Not on file  Physical Activity: Not on file  Stress: Not on file  Social Connections: Not on file   Family History: Family History  Problem  Relation Age of Onset   Depression Mother    Asthma Mother    Heart failure Father    Asthma Sister    Asthma Brother    Post-traumatic stress disorder Brother    Drug abuse Other    Drug abuse Daughter    Diabetes Neg Hx    Colon cancer Neg Hx    Esophageal cancer Neg Hx    Allergies: Allergies  Allergen Reactions   Acetaminophen-Codeine Other (See Comments)    Shortness of breath. This was the codeine component, not tylenol.   Iodinated Contrast Media Rash    Felt like she was itching on the inside; needs 13-hour prep   Iodine Solution [Povidone Iodine] Rash   Oxycodone Itching   Medications: See med rec.  Review of Systems: No headache, visual changes, nausea, vomiting, diarrhea, constipation, dizziness, abdominal pain, skin rash, fevers, chills, night sweats, swollen lymph nodes, weight loss, chest pain, body aches, joint swelling, muscle aches, shortness of breath, mood changes, visual or auditory hallucinations.  Objective:    General: Well Developed, well nourished, and in no acute distress.  Neuro: Alert and oriented x3, extra-ocular muscles intact, sensation grossly intact. Cranial nerves II through XII are intact, motor, sensory, and coordinative functions are all  intact. HEENT: Normocephalic, atraumatic, pupils equal round reactive to light, neck supple, no masses, no lymphadenopathy, thyroid nonpalpable. Oropharynx, nasopharynx, external ear canals are unremarkable. Skin: Warm and dry, no rashes noted.  Cardiac: Regular rate and rhythm, no murmurs rubs or gallops.  Respiratory: Clear to auscultation bilaterally. Not using accessory muscles, speaking in full sentences.  Abdominal: Soft, nontender, nondistended, positive bowel sounds, no masses, no organomegaly.  Musculoskeletal: Shoulder, elbow, wrist, hip, knee, ankle stable, and with full range of motion.  Impression and Recommendations:    The patient was counselled, risk factors were discussed, anticipatory  guidance given.  Annual physical exam Annual physical as above. She would like to switch to [facility] for cervical cancer screening. Ordering mammogram. Tdap today. She got some labs done with her employer and will forward these to me.  Benign essential hypertension Currently diet controlled, off of all medication. She has also stopped drinking all alcohol which I think has been profoundly beneficial.  Depression, major, single episode, moderate (HCC) Mood is good, she has stopped all of her psychotropics, she does continue with behavioral therapy. We will keep an eye on things.  Obesity (BMI 35.0-39.9 without comorbidity) Obesity, she will be part of the multidisciplinary weight loss program with calorie counting and an exercise prescription. Adding Wegovy, will do a 1 month tolerability follow-up, adding Zofran.   ___________________________________________ Gwen Her. [name], M.D., ABFM., CAQSM. Primary Care and Sports Medicine [facility]  Adjunct Professor of [employer] of [employer]

## 2022-02-24 NOTE — Assessment & Plan Note (Signed)
Annual physical as above. She would like to switch to [facility] for cervical cancer screening. Ordering mammogram. Tdap today. She got some labs done with her employer and will forward these to me.

## 2022-02-24 NOTE — Assessment & Plan Note (Signed)
Mood is good, she has stopped all of her psychotropics, she does continue with behavioral therapy. We will keep an eye on things.

## 2022-02-24 NOTE — Assessment & Plan Note (Signed)
Currently diet controlled, off of all medication. She has also stopped drinking all alcohol which I think has been profoundly beneficial.

## 2022-03-02 ENCOUNTER — Telehealth: Payer: Self-pay

## 2022-03-02 NOTE — Telephone Encounter (Signed)
Initiated Prior authorization IFO:YDXAJO 0.25MG/0.5ML auto-injectors Via: Covermymeds Case/Key:[id number] Status: approved  as of 03/02/22 Reason:Coverage Start Date:01/31/2022;Coverage End Date:09/28/2022 Notified Pt via: Mychart

## 2022-03-09 ENCOUNTER — Ambulatory Visit (INDEPENDENT_AMBULATORY_CARE_PROVIDER_SITE_OTHER): Payer: Managed Care, Other (non HMO)

## 2022-03-09 DIAGNOSIS — Z1231 Encounter for screening mammogram for malignant neoplasm of breast: Secondary | ICD-10-CM

## 2022-03-09 DIAGNOSIS — Z0000 Encounter for general adult medical examination without abnormal findings: Secondary | ICD-10-CM

## 2022-03-16 ENCOUNTER — Encounter: Payer: Self-pay | Admitting: Professional

## 2022-03-16 ENCOUNTER — Ambulatory Visit (INDEPENDENT_AMBULATORY_CARE_PROVIDER_SITE_OTHER): Payer: 59 | Admitting: Professional

## 2022-03-16 DIAGNOSIS — F331 Major depressive disorder, recurrent, moderate: Secondary | ICD-10-CM

## 2022-03-16 NOTE — Progress Notes (Addendum)
[facility] Counselor/Therapist Progress Note  Patient ID: Heather Salazar, MRN: [id number],    Date: 03/16/2022  Time Spent: 44 minutes 3-344pm  Treatment Type: Individual Therapy  Risk Assessment: Danger to Self:  No Self-injurious Behavior: No Danger to Others: No Duty to Warn:no  Subjective: This session was held via video teletherapy. The patient consented to video teletherapy and was located at her home during this session. She is aware it is the responsibility of the patient to secure confidentiality on her end of the session. The provider was in a private home office for the duration of this session.  The patient arrived on time for her webex session.  Issues addressed: 1- homework-completed -read article on forgiveness and determine if you need to forgive your supervisor -"I got a long way to go, but I'm working on it" -has started looking at it "from a different perspective" -shoulders don't feel as heavy -"it's no longer a struggle to have a conversation" -she has accepted "what's going to be is what's going to be" -her brother has mentioned the need to forgive as had her pastor at church -"it was rough but I feel better" 2-personal -she is concerned for her father who is having a surgery tomorrow -the physician's have told her there was only so much she could do -her daughter called her this morning to shared that she had a vivid dream about her father passing -pt has been (emotionally) bonding with him as he has gotten older -pt admits that it would hurt but her dad has lived a nice life   -police office for thirty years   -well known in community   -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   "I'll miss him, I don't feel like there will be no regret there" -local five children have equal say in healthcare POA   -middle brother has helped him to get everything in order   -he is remarried and he got things together when he realized that his wife  could die as well 3-mood -pt feels goods -"I'm in a good place" -pt reports "I think I'm taking a stand for myself" 4-daughter -her daughter in [place] has downsized to a two bedroom from a three bedroom -her daughter was put out of her housing and when her mother learned she said she did not have the money to help her -she admits that her daughter has not been the clean person she raised   -she became a different person 5-medication -not on medication -spoke with PCP about medication and when to request  Treatment Plan Problems Addressed  Anxiety Goals 1. Enhance ability to handle effectively life stressors. 2. Increase overall sense of well-being via reduction/elimination of anxiety. 3. Recognize the role of precipitating factors such as social oppression, gender roles, prior trauma, and learned behavior in the perpetuation of anxiety. Objective Develop the ability to determine when the use of anxiety-reduction techniques is necessary and how to use them effectively in 9 out of 10 anxiety-arousing situations. Target Date: 2023-03-17 Frequency: Monthly  Progress: 59 Modality: individual  Objective Attempt to focus on and recreate surroundings, situations, and mental states that have proven to be calming and low-anxiety in the past. Target Date: 2023-03-17 Frequency: Monthly  Progress: 0 Modality: individual  Related Interventions Explore with the client avenues for receiving emotional support from others; connect her with further services such as women's groups when necessary. Objective Self-correct maladaptive thought patterns preemptively in order to lessen or eliminate anxiety at least 90% of  the time; increase positive self-talk. Target Date: 2023-03-17 Frequency: Monthly  Progress: 0 Modality: individual  Related Interventions Formulate with the client at least three possible solutions to resisting expectations that are limiting and anxiety producing. Objective Articulate the  connection between anxiety in women and internal/external contributing factors. Target Date: 2023-03-17 Frequency: Monthly  Progress: 0 Modality: individual  Related Interventions Using behavioral rehearsal of the anxiety-reduction techniques, assist the client in achieving mastery and applying them in real-life situations. Obtain feedback on any difficulties the client experiences in application and provide tips for integrating methods into his/her everyday routine. 4. Resolve the core conflict that is the source of anxiety.  Diagnosis:Major depressive disorder, recurrent episode, moderate ([place])  Plan:  -meet again on Thursday, April 28 2022 at [place], [facility]

## 2022-03-22 ENCOUNTER — Ambulatory Visit: Payer: 59 | Admitting: Professional

## 2022-03-29 ENCOUNTER — Other Ambulatory Visit: Payer: Self-pay | Admitting: Sports Medicine

## 2022-03-29 DIAGNOSIS — E669 Obesity, unspecified: Secondary | ICD-10-CM

## 2022-03-31 ENCOUNTER — Encounter: Payer: Self-pay | Admitting: Obstetrics and Gynecology

## 2022-03-31 ENCOUNTER — Ambulatory Visit (INDEPENDENT_AMBULATORY_CARE_PROVIDER_SITE_OTHER): Payer: Managed Care, Other (non HMO) | Admitting: Obstetrics and Gynecology

## 2022-03-31 ENCOUNTER — Other Ambulatory Visit (HOSPITAL_COMMUNITY)
Admission: RE | Admit: 2022-03-31 | Discharge: 2022-03-31 | Disposition: A | Discharge status: Home / Self Care | Payer: Managed Care, Other (non HMO) | Source: Ambulatory Visit | Attending: Obstetrics and Gynecology | Admitting: Obstetrics and Gynecology

## 2022-03-31 VITALS — BP 138/90 | HR 85 | Ht 65.0 in | Wt 257.0 lb

## 2022-03-31 DIAGNOSIS — Z124 Encounter for screening for malignant neoplasm of cervix: Secondary | ICD-10-CM | POA: Insufficient documentation

## 2022-03-31 DIAGNOSIS — Z1231 Encounter for screening mammogram for malignant neoplasm of breast: Secondary | ICD-10-CM | POA: Diagnosis not present

## 2022-03-31 DIAGNOSIS — N951 Menopausal and female climacteric states: Secondary | ICD-10-CM

## 2022-03-31 DIAGNOSIS — Z01419 Encounter for gynecological examination (general) (routine) without abnormal findings: Secondary | ICD-10-CM | POA: Diagnosis not present

## 2022-04-04 LAB — CYTOLOGY - PAP
Comment: NEGATIVE
Diagnosis: UNDETERMINED — AB
High risk HPV: NEGATIVE

## 2022-04-08 ENCOUNTER — Encounter: Payer: Self-pay | Admitting: Sports Medicine

## 2022-04-08 ENCOUNTER — Ambulatory Visit (INDEPENDENT_AMBULATORY_CARE_PROVIDER_SITE_OTHER): Payer: Managed Care, Other (non HMO) | Admitting: Sports Medicine

## 2022-04-08 DIAGNOSIS — E669 Obesity, unspecified: Secondary | ICD-10-CM | POA: Diagnosis not present

## 2022-04-08 MED ORDER — WEGOVY 0.5 MG/0.5ML ~~LOC~~ SOAJ: 0.5000 mg | SUBCUTANEOUS | 0 refills | Status: DC

## 2022-04-08 MED ORDER — WEGOVY 1.7 MG/0.75ML ~~LOC~~ SOAJ: 1.7000 mg | SUBCUTANEOUS | 0 refills | Status: DC

## 2022-04-08 MED ORDER — WEGOVY 2.4 MG/0.75ML ~~LOC~~ SOAJ: 2.4000 mg | SUBCUTANEOUS | 11 refills | Status: DC

## 2022-04-08 MED ORDER — WEGOVY 1 MG/0.5ML ~~LOC~~ SOAJ: 1.0000 mg | SUBCUTANEOUS | 0 refills | Status: DC

## 2022-04-08 NOTE — Progress Notes (Signed)
    Procedures performed today:    None.  Independent interpretation of notes and tests performed by another provider:   None.  Brief History, Exam, Impression, and Recommendations:    Obesity (BMI 35.0-39.9 without comorbidity) Has done well with Wegovy 0.25, calling in the next several doses, return in 4 months.  Chronic process with exacerbation and pharmacologic intervention  ____________________________________________ Gwen Her. [name], M.D., ABFM., CAQSM., AME. Primary Care and Sports Medicine [facility]  Adjunct Professor of [place]  Risk manager

## 2022-04-08 NOTE — Assessment & Plan Note (Signed)
Has done well with Wegovy 0.25, calling in the next several doses, return in 4 months.

## 2022-04-28 ENCOUNTER — Ambulatory Visit (INDEPENDENT_AMBULATORY_CARE_PROVIDER_SITE_OTHER): Payer: 59 | Admitting: Professional

## 2022-04-28 ENCOUNTER — Encounter: Payer: Self-pay | Admitting: Professional

## 2022-04-28 DIAGNOSIS — F411 Generalized anxiety disorder: Secondary | ICD-10-CM

## 2022-04-28 NOTE — Progress Notes (Signed)
[facility] Counselor/Therapist Progress Note  Patient ID: Heather Salazar, MRN: [id number],    Date: 04/28/2022  Time Spent: 46 minutes 102-148pm  Treatment Type: Individual Therapy  Risk Assessment: Danger to Self:  No Self-injurious Behavior: No Danger to Others: No Duty to Warn:no  Subjective: This session was held via video teletherapy. The patient consented to video teletherapy and was located at her office during this session. She is aware it is the responsibility of the patient to secure confidentiality on her end of the session. The provider was in a private home office for the duration of this session.  The patient arrived on time for her webex session.  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, academic was provided support to develop her skills -her staff person opted to take a demotion   -pt reports she did not blame herself   -pt provided the supports her staff needed   -the pt's supervisors met with the subordinate and they accepted her resignation     -the pt's supervisors agreed with pt's assessment -pt's boss has chosen to leave the county and will be working at [employer] in her home county -Investment banker, operational has found out what is not working with the feedback from a 360 review   -her director has been moving around some of her department heads 4-personal -her daughter and grandchildren are doing well -they all came and celebrated her birthday  2023 Treatment Plan Problems Addressed  Anxiety Goals 1. Enhance ability to handle effectively life stressors. 2. Increase overall sense of well-being via reduction/elimination of anxiety. 3. Recognize the role of precipitating factors such  as social oppression, gender roles, prior trauma, and learned behavior in the perpetuation of anxiety. Objective Develop the ability to determine when the use of anxiety-reduction techniques is necessary and how to use them effectively in 9 out of 10 anxiety-arousing situations. Target Date: 2023-04-28 Frequency: Monthly  Progress: 89 Modality: individual  Objective Attempt to focus on and recreate surroundings, situations, and mental states that have proven to be calming and low-anxiety in the past. Target Date: 2023-04-28 Frequency: Monthly  Progress: 0 Modality: individual  Related Interventions Explore with the client avenues for receiving emotional support from others; connect her with further services such as women's groups when necessary. Objective Self-correct maladaptive thought patterns preemptively in order to lessen or eliminate anxiety at least 90% of the time; increase positive self-talk. Target Date: 2023-04-28 Frequency: Monthly  Progress: 0 Modality: individual  Related Interventions Formulate with the client at least three possible solutions to resisting expectations that are limiting and anxiety producing. Objective Articulate the connection between anxiety in women and internal/external contributing factors. Target Date: 2023-04-28 Frequency: Monthly  Progress: 0 Modality: individual  Related Interventions Using behavioral rehearsal of the anxiety-reduction techniques, assist the client in achieving mastery and applying them in real-life situations. Obtain feedback on any difficulties the client experiences in application and provide tips for integrating methods into his/her everyday routine. 4. Resolve the core conflict that is the source of anxiety.  2022 Treatment Plan Problems Addressed  Anxiety, Balancing Work and Family/Multiple Roles, Depression, Low Self-Esteem/Lack of Assertiveness  Goals 1. Develop a realistic perspective regarding demands and obligations  of multiple roles and their completion. 2. Develop a support system to assist  with multiple roles and responsibilities. Objective Generate a list of self-care activities and make a commitment to regularly participate in such activities. Target Date: 2022-03-08 Frequency: Monthly  Progress: 90 Modality: individual  Objective Identify at least five practical ways to manage stress associated with multiple demands. Target Date: 2022-03-08 Frequency: Monthly  Progress: 90 Modality: individual  Objective Learn and implement stress management and relaxation techniques to reduce fatigue, anxiety, and depressive symptoms. Target Date: 2022-03-08 Frequency: Monthly  Progress: 90 Modality: individual  Objective Implement assertiveness skills and limit setting. Target Date: 2022-03-08 Frequency: Monthly  Progress: 90 Modality: individual  Objective Identify and replace internalized expectations and standards regarding multiple roles that are potentially harmful. Target Date: 2022-03-08 Frequency: Monthly  Progress: 100 Modality: individual  3. Eliminate depressive and anxiety symptoms associated with trying to balance multiple roles. 4. Enhance ability to effectively handle life stressors. 5. Identify and increase intrapersonal, interpersonal, and physical resources to foster positive coping strategies. 6. Improve depressed mood to maximize effective social, occupational, and physical functioning. 7. Improve self-esteem and develop a positive self-image as capable and competent. Objective Identify and strengthen connections to others who affirm self-esteem and respect one's assertiveness. Target Date: 2022-03-08 Frequency: Monthly  Progress: 90 Modality: individual  Related Interventions Assess the client's beliefs about appropriate gender-role behaviors for women, as well as any feelings of role conflict that she may be experiencing. Collaborate with the client to develop concrete short- and  long-term goals to decrease depression, considering personal and interpersonal advantages associated with goals and treatment needs; review goals periodically. Use behavioral techniques (i.e., education, modeling, role-playing, corrective feedback, positive reinforcement) to teach the couple problem-solving and conflict-resolution skills, including defining the problem constructively and specifically, brainstorming options, evaluating options, compromise, choosing options and implementing a plan, and evaluating the results. Assist the client with identifying those expectations that are helpful and those that are potentially harmful; help the client discard those that are causing undue stress and strain and replace them with more realistic messages. Assign the couple a homework exercise to use and record newly learned problem-solving and conflict-resolution skills (or assign "Applying Problem-Solving to Interpersonal Conflict" in the Adult Psychotherapy Homework Planner, 2nd ed. by Jongsma); process results in session. Explore with the client her expectations regarding the balance between work and family, and discuss how this relates to internalized gender role stereotypes. Educate the client on thought-stopping techniques and positive reframing in order to preempt anxious reactions. Identify unresolved aspects of prior traumatic events (e.g., rape, witnessing/experiencing extreme violence, natural disaster) and help the client to resolve them. Offer suggestions as to possible activities or causes to which the client can devote her time and expertise. Aid the client in seeing the connection between a focus on helping others or improving society and a reduction in anxiety (i.e., dwelling on maladaptive thoughts). Explore areas of competency with the client and encourage her to engage in related activities (e.g., dance, singing, arts, volunteer work, joining a book club). Explore the client's fears of being  assertive (e.g., fear of rejection, fear of ineffectiveness, fear of ridicule). Teach the client three coping skills (e.g., deep-breathing exercises, muscle-relaxation techniques, stress management, meditation, and guided imagery) to combat the physiological cues of anxiety. Validate the client's feelings and encourage her attempts to listen to herself; reinforce experiences of asserting her rights and feelings. Objective Practice saying "no" to at least one person, declining to comply with a request/favor or identify and assert rights, needs, and wants. Target Date: 2022-03-08 Frequency: Monthly  Progress:   100 Modality: individual  Objective Identify three roadblocks to improved self-esteem. Target Date: 2022-03-08 Frequency: Monthly  Progress: 90 Modality: individual  Objective Verbalize an understanding of the differences between passive, assertive, and aggressive behavior. Target Date: 2022-03-08 Frequency: Monthly  Progress: 90 Modality: individual  Objective Decrease the frequency of making self-disparaging remarks. Target Date: 2022-03-08 Frequency: Monthly  Progress: 80 Modality: individual  Related Interventions Ask the client to make a list including positive qualities about herself and accomplishments; verbally reinforce positive self-statements. Assist the client in developing a list of positive affirmations to be read three times a day or to be put on a mirror at home. Encourage the client to make at least one positive statement about herself during the therapy session. 8. Increase ability to express needs and desires openly and honestly. 9. Increase assertiveness skills and ability to advocate for self. 10. Recognize the role of precipitating factors such as social oppression, gender roles, prior trauma, and learned behavior in the perpetuation of anxiety. Objective Work through any unresolved aspects of prior traumatic events and understand the impact of trauma on anxiety  reactions. Target Date: 2022-03-08 Frequency: Monthly  Progress: 90 Modality: individual  Objective Verbalize the degree to which current life circumstances could be improved in areas such as work, home, and school. Target Date: 2022-03-08 Frequency: Monthly  Progress: 80 Modality: individual  Objective Begin to refer to an internal standard of performance, morals, achievement, appearance, and so on, rather than sociocultural, parental, spousal, or otherwise externally imposed judgments. Target Date: 2022-03-08 Frequency: Monthly  Progress: 100 Modality: individual  Objective Develop the ability to determine when the use of anxiety-reduction techniques is necessary and how to use them effectively in 9 out of 10 anxiety-arousing situations. Target Date: 2022-03-08 Frequency: Monthly  Progress: 90 Modality: individual  Objective Identify precipitating events, thoughts, feelings, and reactions that are believed to contribute to anxiety. Target Date: 2022-03-08 Frequency: Monthly  Progress: 90 Modality: individual  Related Interventions Aid the client in devising a healthy diet and eating schedule, a pattern of adequate sleep and relaxation, as well as regular cardiovascular exercise. 11. Resolve the core conflict that is the source of anxiety. 12. Understand the relationships among women's roles, gender socialization, cultural background, and depression. Objective Articulate self-care methods to prevent and/or manage depression in the future; create a list of 10 coping methods for future use. Target Date: 2022-03-08 Frequency: Monthly  Progress: 90 Modality: individual  Objective Implement behavioral interventions to overcome depression. Target Date: 2022-03-08 Frequency: Monthly  Progress: 100 Modality: individual  Related Interventions Encourage the client to continue participation in positive social support systems. Assist the client to consider upcoming stressors; brainstorm with  her 10 ways to mitigate depression in the future. Objective Increase the frequency of engaging in pleasant activities. Target Date: 2022-03-08 Frequency: Monthly  Progress: 100 Modality: individual  Related Interventions Monitor and encourage the client to attend to personal grooming and healthy sleeping and eating patterns; reinforce improvement. Objective Identify and replace cognitive self-talk that supports depression. Target Date: 2022-03-08 Frequency: Monthly  Progress: 100 Modality: individual  Related Interventions Encourage the client to discuss cognitive distortions, including automatic thoughts (e.g., negative view of self, future, experience) and negative schemas (e.g., core beliefs about self and others based on earlier childhood experiences); assess frequency of negative self-statements associated with depression.  Diagnosis:Generalized anxiety disorder  Plan:  -meet again on Tuesday, May 24, 2022 at 11am  Kathy Collins, LCMHC 

## 2022-05-24 ENCOUNTER — Ambulatory Visit: Payer: 59 | Admitting: Professional

## 2022-06-23 IMAGING — XA Imaging study
1 series · 1 of 1 positions shown · non-contrast
Comparison: none

CLINICAL DATA: Lumbosacral spondylosis without myelopathy.
Significant relief after the previous injection, without side effect
or complication. Partial recurrence of symptoms. The patient wishes
to repeat.

[Series 1: ortho adipose · 1 of 1 slices shown]
[im 1/1]
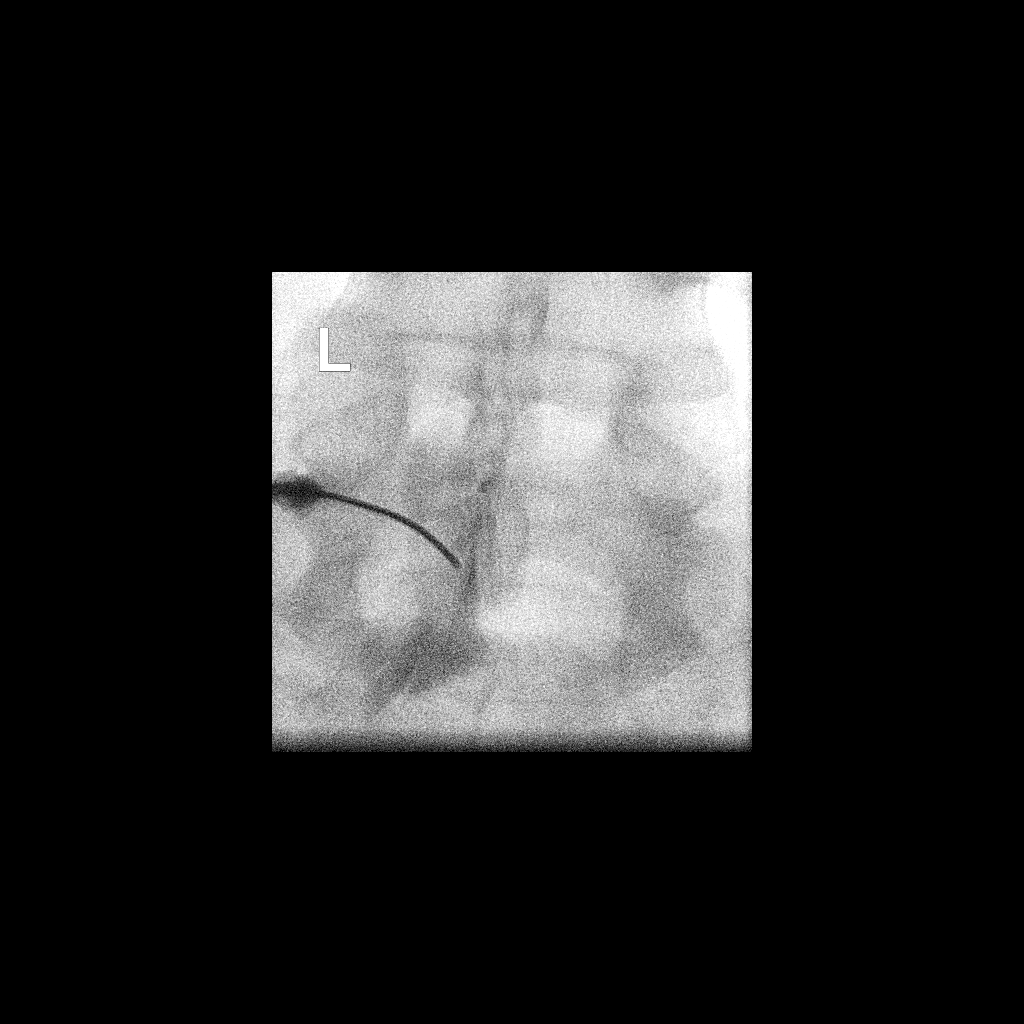

[1 of 1 positions shown; findings below may reference images not displayed]

EXAM:
LUMBAR EPIDURAL INJECTION:

DIAGNOSTIC EPIDURAL INJECTION:

THERAPEUTIC EPIDURAL INJECTION:

PROCEDURE:
The procedure, risks, benefits, and alternatives were explained to
the patient. Questions regarding the procedure were encouraged and
answered. The patient understands and consents to the procedure.

An interlaminar approach was performed on left at L5-S1. The
overlying skin was cleansed and anesthetized. A 20 gauge epidural
needle was advanced using loss-of-resistance technique.

Injection of Isovue-M 200 shows a good epidural pattern with spread
above and below the level of needle placement, primarily on the left
with some crossing midline. No vascular opacification is seen.

80mg of Depo-Medrol mixed with 2ml lidocaine 1% were instilled. The
procedure was well-tolerated, and the patient was discharged thirty
minutes following the injection in good condition.

FLUOROSCOPY TIME:  37 seconds; 34 uWymM DAP

COMPLICATIONS:
None immediate
IMPRESSION: Technically successful epidural injection on the left at L5-S1.

## 2022-08-08 ENCOUNTER — Encounter: Payer: Self-pay | Admitting: Sports Medicine

## 2022-08-08 ENCOUNTER — Ambulatory Visit: Payer: Managed Care, Other (non HMO) | Admitting: Sports Medicine

## 2022-08-08 DIAGNOSIS — Z23 Encounter for immunization: Secondary | ICD-10-CM

## 2022-08-08 DIAGNOSIS — Z0000 Encounter for general adult medical examination without abnormal findings: Secondary | ICD-10-CM

## 2022-08-08 DIAGNOSIS — E669 Obesity, unspecified: Secondary | ICD-10-CM | POA: Diagnosis not present

## 2022-08-08 MED ORDER — SEMAGLUTIDE (2 MG/DOSE) 8 MG/3ML ~~LOC~~ SOPN
PEN_INJECTOR | SUBCUTANEOUS | 3 refills | Status: DC
Start: 2022-08-08 — End: 2023-05-05

## 2022-08-08 NOTE — Assessment & Plan Note (Signed)
Shingrix given today, she will return in 2 to 6 months for Shingrix No. 2, she will get her flu vaccine with her employer.

## 2022-08-08 NOTE — Progress Notes (Signed)
    Procedures performed today:    None.  Independent interpretation of notes and tests performed by another provider:   None.  Brief History, Exam, Impression, and Recommendations:    Obesity (BMI 35.0-39.9 without comorbidity) This pleasant 51 year old female returns, she did well with Wegovy, she got up to 1 mg and is unfortunately unable to find the 1.7 mg formulation. We will have her do compounded semaglutide/vitamin B6 for a month and then try to get the 2.4 mg Wegovy next.  She will need to call around to some pharmacies to find it. 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  Annual physical exam Shingrix given today, she will return in 2 to 6 months for Shingrix No. 2, she will get her flu vaccine with her employer.    ____________________________________________ Gwen Her. [name], M.D., ABFM., CAQSM., AME. Primary Care and Sports Medicine [facility]  Adjunct Professor of [place] of [employer] of Medicine  Risk manager

## 2022-08-08 NOTE — Assessment & Plan Note (Signed)
This pleasant 51 year old female returns, she did well with Wegovy, she got up to 1 mg and is unfortunately unable to find the 1.7 mg formulation. We will have her do compounded semaglutide/vitamin B6 for a month and then try to get the 2.4 mg Wegovy next.  She will need to call around to some pharmacies to find it. In addition she was just promoted to [employer] of [place] [employer].

## 2022-11-15 ENCOUNTER — Ambulatory Visit (INDEPENDENT_AMBULATORY_CARE_PROVIDER_SITE_OTHER): Payer: Managed Care, Other (non HMO) | Admitting: Sports Medicine

## 2022-11-15 VITALS — BP 126/82 | HR 83 | Ht 65.0 in

## 2022-11-15 DIAGNOSIS — Z23 Encounter for immunization: Secondary | ICD-10-CM

## 2022-11-15 NOTE — Progress Notes (Signed)
   Established Patient Office Visit  Subjective   Patient ID: Heather Salazar, female    DOB: 1971-05-18  Age: 52 y.o. MRN: [id number]  Chief Complaint  Patient presents with   2nd shingrix vaccine    2nd shingrix vaccine - nurse visit - ( initial given 10/30/ 23)     HPI  2nd shingrix vaccine -  last given 08/08/2022.   ROS    Objective:     BP 126/82   Pulse 83   Ht '5\' 5"'$  (1.651 m)   LMP 09/06/2018 (Exact Date) Comment: patient signed preg test waiver  SpO2 100%   BMI 44.60 kg/m    Physical Exam   No results found for any visits on 11/15/22.    The ASCVD Risk score (Arnett DK, et al., 2019) failed to calculate for the following reasons:   Cannot find a previous HDL lab   Cannot find a previous total cholesterol lab    Assessment & Plan:  2nd shingrix vaccine - ( initial Shingrix given 08/08/2022). Shingrix vaccine Injection given IM R deltoid . Patient tolerated injection well without complications.  Problem List Items Addressed This Visit   None Visit Diagnoses     Need for shingles vaccine    -  Primary   Relevant Orders   Varicella-zoster vaccine IM (Completed)       No follow-ups on file.    Rae Lips, LPN

## 2022-12-30 ENCOUNTER — Ambulatory Visit: Payer: Managed Care, Other (non HMO) | Admitting: Sports Medicine

## 2022-12-30 ENCOUNTER — Encounter: Payer: Self-pay | Admitting: Sports Medicine

## 2022-12-30 VITALS — BP 156/92 | HR 83

## 2022-12-30 DIAGNOSIS — L309 Dermatitis, unspecified: Secondary | ICD-10-CM

## 2022-12-30 DIAGNOSIS — M722 Plantar fascial fibromatosis: Secondary | ICD-10-CM | POA: Diagnosis not present

## 2022-12-30 MED ORDER — TRIAMCINOLONE ACETONIDE 0.5 % EX OINT: 1.0000 | TOPICAL_OINTMENT | Freq: Two times a day (BID) | CUTANEOUS | 3 refills | Status: DC

## 2022-12-30 NOTE — Progress Notes (Signed)
    Procedures performed today:    None.  Independent interpretation of notes and tests performed by another provider:   None.  Brief History, Exam, Impression, and Recommendations:    Plantar fasciitis, bilateral Pleasant 52 year old female, increasing foot pain for the past several months, plantar aspect worse in the morning, worse with barefoot walking. 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 We added scaphoid pads, I would like her to get custom molded orthotics, she will avoid barefoot walking, adding home physical therapy, return to see me in 6 weeks.  Eczematous and venous stasis dermatitis Also having itchy rash dorsum of both hands, nothing in the flexural creases, she has an itchy rash with excoriations on both legs. I do see and feel significant venous varicosities. On her lower legs I think she has more of a venous stasis dermatitis, the dorsum of her hands look like eczematous dermatitis, adding topical triamcinolone ointment, she will get bilateral lower extremity compression stockings. She does need to lose a lot of weight.    ____________________________________________ Gwen Her. [name], M.D., ABFM., CAQSM., AME. Primary Care and Sports Medicine [facility]  Adjunct Professor of [place] [employer] of Medicine  Risk manager

## 2022-12-30 NOTE — Patient Instructions (Signed)
Bilateral lower extremity knee-high medium graduated compression stockings

## 2022-12-30 NOTE — Assessment & Plan Note (Signed)
Pleasant 52 year old female, increasing foot pain for the past several months, plantar aspect worse in the morning, worse with barefoot walking. 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 We added scaphoid pads, I would like her to get custom molded orthotics, she will avoid barefoot walking, adding home physical therapy, return to see me in 6 weeks.

## 2022-12-30 NOTE — Assessment & Plan Note (Signed)
Also having itchy rash dorsum of both hands, nothing in the flexural creases, she has an itchy rash with excoriations on both legs. I do see and feel significant venous varicosities. On her lower legs I think she has more of a venous stasis dermatitis, the dorsum of her hands look like eczematous dermatitis, adding topical triamcinolone ointment, she will get bilateral lower extremity compression stockings. She does need to lose a lot of weight.

## 2023-01-02 ENCOUNTER — Encounter: Payer: Self-pay | Admitting: Sports Medicine

## 2023-01-09 ENCOUNTER — Ambulatory Visit: Payer: Managed Care, Other (non HMO) | Admitting: Family Medicine

## 2023-01-09 ENCOUNTER — Encounter: Payer: Self-pay | Admitting: Family Medicine

## 2023-01-09 VITALS — Ht 65.0 in | Wt 268.0 lb

## 2023-01-09 DIAGNOSIS — M722 Plantar fascial fibromatosis: Secondary | ICD-10-CM | POA: Diagnosis not present

## 2023-01-09 NOTE — Assessment & Plan Note (Signed)
Acutely occurring. Having pain consistent with plantar fasciitis.  - counseled on home exercise therapy and supportive care - orthotics  - could consider smart cell orthotics for her dress shoes.

## 2023-01-09 NOTE — Progress Notes (Signed)
  Heather Salazar - 52 y.o. female MRN [id number]  Date of birth: 26-Jan-1971  SUBJECTIVE:  Including CC & ROS.  No chief complaint on file.   Heather Salazar is a 52 y.o. female that is  here for foot pain. The pain is worse with prolonged standing and the first few steps in the morning.    Review of Systems See HPI   HISTORY: Past Medical, Surgical, Social, and Family History Reviewed & Updated per EMR.   Pertinent Historical Findings include:  Past Medical History:  Diagnosis Date   Anxiety    Arthritis    back    Asthma    childhood   COVID-19    x3   Depression    GERD (gastroesophageal reflux disease)    History of colon polyps    Hypertension    PTSD (post-traumatic stress disorder)     Past Surgical History:  Procedure Laterality Date   CESAREAN SECTION     CHOLECYSTECTOMY N/A 03/06/2018   Procedure: LAPAROSCOPIC CHOLECYSTECTOMY ERAS PATHWAY;  Surgeon: Clovis Riley, MD;  Location: [facility];  Service: General;  Laterality: N/A;   COLONOSCOPY     In [place] [phone] did have a small polyp   DILATION AND CURETTAGE OF UTERUS N/A    DILATION AND CURETTAGE OF UTERUS N/A    DILATION AND CURETTAGE OF UTERUS N/A    DILATION AND CURETTAGE OF UTERUS N/A    DILATION AND CURETTAGE OF UTERUS     tubaligation       PHYSICAL EXAM:  VS: Ht 5\' 5"  ([id number] m)   Wt 268 lb (121.6 kg)   LMP 09/06/2018 (Exact Date) Comment: patient signed preg test waiver  BMI 44.60 kg/m  Physical Exam Gen: NAD, alert, cooperative with exam, well-appearing MSK:  Neurovascularly intact    Patient was fitted for a standard, cushioned, semi-rigid orthotic. The orthotic was heated and afterward the patient stood on the orthotic blank positioned on the orthotic stand. The patient was positioned in subtalar neutral position and 10 degrees of ankle dorsiflexion in a weight bearing stance. After completion of molding, a stable base was applied to the orthotic  blank. The blank was ground to a stable position for weight bearing. Size: 9 Pairs: 2 Base: Blue EVA Additional Posting and Padding: None The patient ambulated these, and they were very comfortable.    ASSESSMENT & PLAN:   Plantar fasciitis, bilateral Acutely occurring. Having pain consistent with plantar fasciitis.  - counseled on home exercise therapy and supportive care - orthotics  - could consider smart cell orthotics for her dress shoes.

## 2023-01-23 ENCOUNTER — Encounter: Payer: Self-pay | Admitting: *Deleted

## 2023-02-04 ENCOUNTER — Encounter: Payer: Self-pay | Admitting: Obstetrics and Gynecology

## 2023-02-04 ENCOUNTER — Telehealth: Payer: Managed Care, Other (non HMO) | Admitting: Family Medicine

## 2023-02-04 DIAGNOSIS — N950 Postmenopausal bleeding: Secondary | ICD-10-CM | POA: Diagnosis not present

## 2023-02-04 NOTE — Patient Instructions (Signed)
Postmenopausal Bleeding Postmenopausal bleeding is any bleeding that a woman has after she has entered menopause. Menopause is the end of a woman's fertile years. After menopause, a woman no longer ovulates and does not have menstrual periods. Therefore, she should no longer have bleeding from her vagina. Postmenopausal bleeding may have various causes, including: Menopausal hormone therapy (MHT). Endometrial atrophy. After menopause, low estrogen hormone levels cause the membrane that lines the uterus (endometrium) to become thin. You may have bleeding as the endometrium thins. Endometrial hyperplasia. This condition is caused by excess estrogen hormones and low levels of progesterone hormones. The excess estrogen causes the endometrium to thicken, which can lead to bleeding. In some cases, this can lead to cancer of the uterus. Endometrial cancer. Noncancerous growths (polyps) on the endometrium, the lining of the uterus, or the cervix. Uterine fibroids. These are noncancerous growths in or around the uterus muscle tissue that can cause heavy bleeding. Any type of postmenopausal bleeding, even if it appears to be a typical menstrual period, should be checked by your health care provider. Treatment will depend on the cause of the bleeding. Follow these instructions at home:  Pay attention to any changes in your symptoms. Let your health care provider know about them. Avoid using tampons and douches as told by your health care provider. Change your pads regularly. Get regular pelvic exams, including Pap tests, as told by your health care provider. Take iron supplements as told by your health care provider. Take over-the-counter and prescription medicines only as told by your health care provider. Keep all follow-up visits. This is important. Contact a health care provider if: You have new bleeding from the vagina after menopause. You have pain in your abdomen. Get help right away if: You have  a fever or chills. You have severe pain with bleeding. You are passing blood clots. You have heavy bleeding, need more than 1 pad an hour, and have never experienced this before. You have headaches or feel faint or dizzy. Summary Postmenopausal bleeding is any bleeding that a woman has after she has entered into menopause. Postmenopausal bleeding may have various causes. Treatment will depend on the cause of the bleeding. Any type of postmenopausal bleeding, even if it appears to be a typical menstrual period, should be checked by your health care provider. Be sure to pay attention to any changes in your symptoms and keep all follow-up visits. This information is not intended to replace advice given to you by your health care provider. Make sure you discuss any questions you have with your health care provider. Document Revised: 03/12/2020 Document Reviewed: 03/12/2020 Elsevier Patient Education  2023 Elsevier Inc.  

## 2023-02-04 NOTE — Progress Notes (Signed)
Virtual Visit Consent   Heather Salazar, you are scheduled for a virtual visit with a [facility] provider today. Just as with appointments in the office, your consent must be obtained to participate. Your consent will be active for this visit and any virtual visit you may have with one of our providers in the next 365 days. If you have a MyChart account, a copy of this consent can be sent to you electronically.  As this is a virtual visit, video technology does not allow for your provider to perform a traditional examination. This may limit your provider's ability to fully assess your condition. If your provider identifies any concerns that need to be evaluated in person or the need to arrange testing (such as labs, EKG, etc.), we will make arrangements to do so. Although advances in technology are sophisticated, we cannot ensure that it will always work on either your end or our end. If the connection with a video visit is poor, the visit may have to be switched to a telephone visit. With either a video or telephone visit, we are not always able to ensure that we have a secure connection.  By engaging in this virtual visit, you consent to the provision of healthcare and authorize for your insurance to be billed (if applicable) for the services provided during this visit. Depending on your insurance coverage, you may receive a charge related to this service.  I need to obtain your verbal consent now. Are you willing to proceed with your visit today? [name] has provided verbal consent on 02/04/2023 for a virtual visit (video or telephone). Georgana Curio, FNP  Date: 02/04/2023 12:55 PM  Virtual Visit via Video Note   I, Georgana Curio, connected with  Ezabella Teska  ([id number], [birth year]) on 02/04/23 at 12:45 PM EDT by a video-enabled telemedicine application and verified that I am speaking with the correct person using two identifiers.  Location: Patient: Virtual Visit Location Patient:  Home Provider: Virtual Visit Location Provider: Home Office   I discussed the limitations of evaluation and management by telemedicine and the availability of in person appointments. The patient expressed understanding and agreed to proceed.    History of Present Illness: Heather Salazar is a 52 y.o. who identifies as a female who was assigned female at birth, and is being seen today for menstrual bleeding, low back pain, period after 5 yrs, cramping.Started yesterday. She has contacted her GYN and left a message. No fever.   HPI: HPI  Problems:  Patient Active Problem List   Diagnosis Date Noted   Plantar fasciitis, bilateral 12/30/2022   Eczematous and venous stasis dermatitis 12/30/2022   Generalized anxiety disorder 04/28/2022   Retroperitoneal lymphadenopathy 12/21/2020   Depression, major, single episode, moderate (HCC) 11/12/2020   Lumbar degenerative disc disease 11/12/2020   Persistent asthma without complication 10/13/2020   Smoker 11/29/2019   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   Insomnia 03/05/2019   Gastro-esophageal reflux 09/13/2018   Benign essential hypertension 01/01/2018   Biliary colic 12/28/2017   Annual physical exam 01/16/2015   Polycystic ovarian syndrome 01/16/2015   Obesity (BMI 35.0-39.9 without comorbidity) 01/16/2015    Allergies:  Allergies  Allergen Reactions   Acetaminophen-Codeine Other (See Comments)    Shortness of breath. This was the codeine component, not tylenol.   Povidone-Iodine Other (See Comments) and Rash   Iodinated Contrast Media Rash    Felt like she was itching on the inside; needs 13-hour prep   Iodine Solution [Povidone Iodine] Rash  Oxycodone Itching   Medications:  Current Outpatient Medications:    acetaminophen (TYLENOL) 650 MG CR tablet, Take 1,300 mg by mouth daily with breakfast., Disp: , Rfl:    ondansetron (ZOFRAN-ODT) 8 MG disintegrating tablet, Take 1 tablet (8 mg total) by mouth every 8 (eight) hours as needed for  nausea., Disp: 20 tablet, Rfl: 3   Semaglutide, 2 MG/DOSE, 8 MG/3ML SOPN, Semaglutide 2.5 mg/mL + VIt B6 10mg /mL.  Inject 68u/0.20mL/1.7mg  subcu weekly x4 weeks then 100u/33mL/2.5mg  subcu weekly., Disp: 2 mL, Rfl: 3   triamcinolone ointment (KENALOG) 0.5 %, Apply 1 Application topically 2 (two) times daily. To affected area, avoid eyes and face, Disp: 30 g, Rfl: 3   WEGOVY 2.4 MG/0.75ML SOAJ, Inject 2.4 mg into the skin once a week., Disp: 3 mL, Rfl: 11  Observations/Objective: Patient is well-developed, well-nourished in no acute distress.  Resting comfortably  at home.  Head is normocephalic, atraumatic.  No labored breathing.  Speech is clear and coherent with logical content.  Patient is alert and oriented at baseline.    Assessment and Plan: 1. Post-menopausal bleeding  Advised to go to UC for worsening sx otherwise see GYN this week. She is aware of the concerns associated with post menopausal bleeding.   Follow Up Instructions: I discussed the assessment and treatment plan with the patient. The patient was provided an opportunity to ask questions and all were answered. The patient agreed with the plan and demonstrated an understanding of the instructions.  A copy of instructions were sent to the patient via MyChart unless otherwise noted below.     The patient was advised to call back or seek an in-person evaluation if the symptoms worsen or if the condition fails to improve as anticipated.  Time:  I spent 10 minutes with the patient via telehealth technology discussing the above problems/concerns.    Georgana Curio, FNP

## 2023-02-06 ENCOUNTER — Telehealth: Payer: Self-pay | Admitting: General Practice

## 2023-02-06 NOTE — Transitions of Care (Post Inpatient/ED Visit) (Signed)
   02/06/2023  Name: Heather Salazar MRN: [id number] DOB: 10-06-1971  Today's TOC FU Call Status: Today's TOC FU Call Status:: Successful TOC FU Call Competed TOC FU Call Complete Date: 02/06/23  Transition Care Management Follow-up Telephone Call Date of Discharge: 02/06/23 [facility]charge Facility[facility] Other [facility]) Name of Other ([facility]) [facility] Facility[facility] Type of Discharge: Emergency Department Reason for ED Visit: Other: (vaginal bleeding) How have you been since you were released from the hospital?: Better Any questions or concerns?: No  Items Reviewed: Did you receive and understand the discharge instructions provided?: Yes Medications obtained and verified?: Yes (Medications Reviewed) Any new allergies since your discharge?: No Dietary orders reviewed?: No Do you have support at home?: Yes  Home Care and Equipment/Supplies: Were Home Health Services Ordered?: NA Any new equipment or medical supplies ordered?: NA  Functional Questionnaire: Do you need assistance with bathing/showering or dressing?: No Do you need assistance with meal preparation?: No Do you need assistance with eating?: No Do you have difficulty maintaining continence: No Do you need assistance with getting out of bed/getting out of a chair/moving?: No Do you have difficulty managing or taking your medications?: No  Follow up appointments reviewed: PCP Follow-up appointment confirmed?: NA Specialist Hospital Follow-up appointment confirmed?: Yes Date of Specialist follow-up appointment?: 02/23/23 Follow-Up Specialty Provider:: with gyn Do you need transportation to your follow-up appointment?: No Do you understand care options if your condition(s) worsen?: Yes-patient verbalized understanding    SIGNATURE: Modesto Charon, RN BSN

## 2023-02-10 ENCOUNTER — Ambulatory Visit: Payer: Managed Care, Other (non HMO) | Admitting: Sports Medicine

## 2023-02-10 DIAGNOSIS — M722 Plantar fascial fibromatosis: Secondary | ICD-10-CM

## 2023-02-10 DIAGNOSIS — N888 Other specified noninflammatory disorders of cervix uteri: Secondary | ICD-10-CM

## 2023-02-10 DIAGNOSIS — L309 Dermatitis, unspecified: Secondary | ICD-10-CM | POA: Diagnosis not present

## 2023-02-10 NOTE — Assessment & Plan Note (Signed)
Eczematous dermatitis on the hands have improved considerably with topical triamcinolone, she has not yet gotten compression stockings.

## 2023-02-10 NOTE — Assessment & Plan Note (Signed)
Also developed some vaginal bleeding and cramping over the weekend, was seen in the ED, ultrasound did note a necrotic cervical mass concerning for cervical cancer, she does have follow-up with [facility] and has D&C for biopsy as well as a CT scheduled.

## 2023-02-10 NOTE — Assessment & Plan Note (Signed)
Improved when she wears the orthotics, does the conditioning and avoiding barefoot walking, she is intermittently compliant with all the above so she will work on increasing compliance before we consider injection.

## 2023-02-10 NOTE — Progress Notes (Signed)
    Procedures performed today:    None.  Independent interpretation of notes and tests performed by another provider:   None.  Brief History, Exam, Impression, and Recommendations:    Eczematous and venous stasis dermatitis Eczematous dermatitis on the hands have improved considerably with topical triamcinolone, she has not yet gotten compression stockings.  Plantar fasciitis, bilateral Improved when she wears the orthotics, does the conditioning and avoiding barefoot walking, she is intermittently compliant with all the above so she will work on increasing compliance before we consider injection.  Cervical mass Also developed some vaginal bleeding and cramping over the weekend, was seen in the ED, ultrasound did note a necrotic cervical mass concerning for cervical cancer, she does have follow-up with [facility] and has D&C for biopsy as well as a CT scheduled.    ____________________________________________ Ihor Austin. Benjamin Stain, M.D., ABFM., CAQSM., AME. Primary Care and Sports Medicine [facility]  Adjunct Professor of Family Medicine  [place] [employer] [employer]  Restaurant manager, fast food

## 2023-02-14 ENCOUNTER — Telehealth: Payer: Self-pay | Admitting: General Practice

## 2023-02-14 NOTE — Transitions of Care (Post Inpatient/ED Visit) (Signed)
   02/14/2023  Name: Heather Salazar MRN: [id number] DOB: 1970/12/24  Today's TOC FU Call Status: Today's TOC FU Call Status:: Successful TOC FU Call Competed TOC FU Call Complete Date: 02/14/23  Transition Care Management Follow-up Telephone Call Date of Discharge: 02/13/23 [facility]charge Facility: Other ([facility]) Name of Other [facility]charge Facility: [facility] Type of Discharge: Emergency Department Reason for ED Visit: Other: (Anaphylaxis) How have you been since you were released from the hospital?: Better Any questions or concerns?: No  Items Reviewed: Did you receive and understand the discharge instructions provided?: Yes Medications obtained,verified, and reconciled?: Yes (Medications Reviewed) Any new allergies since your discharge?: No (CT contrast allergy listed in my chart) Dietary orders reviewed?: NA Do you have support at home?: Yes  Medications Reviewed Today: Medications Reviewed Today     Reviewed by Carren Rang, CMA (Certified Medical Assistant) on 02/10/23 at 1521  Med List Status: <None>   Medication Order Taking? Sig Documenting Provider Last Dose Status Informant  acetaminophen (TYLENOL) 650 MG CR tablet [id number] Yes Take 1,300 mg by mouth daily with breakfast. [provider] Taking Active   HYDROcodone-acetaminophen (NORCO) 10-325 MG tablet [id number] Yes Take 1-2 tablets by mouth every 6 (six) hours as needed for severe pain or moderate pain. [provider] Taking Active   ondansetron (ZOFRAN-ODT) 8 MG disintegrating tablet [id number] Yes Take 1 tablet (8 mg total) by mouth every 8 (eight) hours as needed for nausea. Monica Becton, MD Taking Active   Semaglutide, 2 MG/DOSE, 8 MG/3ML SOPN [id number] Yes Semaglutide 2.5 mg/mL + VIt B6 10mg /mL.  Inject 68u/0.1mL/1.7mg  subcu weekly x4 weeks then 100u/85mL/2.5mg  subcu weekly. Monica Becton, MD Taking Active   triamcinolone ointment (KENALOG) 0.5 % [id number] Yes  Apply 1 Application topically 2 (two) times daily. To affected area, avoid eyes and face Monica Becton, MD Taking Active   WEGOVY 2.4 MG/0.75ML Ivory Broad [id number] Yes Inject 2.4 mg into the skin once a week. Monica Becton, MD Taking Active             Home Care and Equipment/Supplies: Were Home Health Services Ordered?: NA Any new equipment or medical supplies ordered?: NA  Functional Questionnaire: Do you need assistance with bathing/showering or dressing?: No Do you need assistance with meal preparation?: No Do you need assistance with eating?: No Do you have difficulty maintaining continence: No Do you need assistance with getting out of bed/getting out of a chair/moving?: No Do you have difficulty managing or taking your medications?: No  Follow up appointments reviewed: PCP Follow-up appointment confirmed?: NA Specialist Hospital Follow-up appointment confirmed?: NA Do you need transportation to your follow-up appointment?: No Do you understand care options if your condition(s) worsen?: Yes-patient verbalized understanding    SIGNATURE Modesto Charon, RN BSN

## 2023-02-23 ENCOUNTER — Ambulatory Visit: Payer: Managed Care, Other (non HMO) | Admitting: Obstetrics & Gynecology

## 2023-04-25 ENCOUNTER — Encounter: Payer: Managed Care, Other (non HMO) | Admitting: Sports Medicine

## 2023-05-05 ENCOUNTER — Encounter: Payer: Self-pay | Admitting: Sports Medicine

## 2023-05-05 ENCOUNTER — Ambulatory Visit (INDEPENDENT_AMBULATORY_CARE_PROVIDER_SITE_OTHER): Payer: Managed Care, Other (non HMO) | Admitting: Sports Medicine

## 2023-05-05 VITALS — BP 137/83 | HR 93 | Ht 65.0 in | Wt 291.0 lb

## 2023-05-05 DIAGNOSIS — I498 Other specified cardiac arrhythmias: Secondary | ICD-10-CM | POA: Diagnosis not present

## 2023-05-05 DIAGNOSIS — Z90710 Acquired absence of both cervix and uterus: Secondary | ICD-10-CM | POA: Diagnosis not present

## 2023-05-05 DIAGNOSIS — E669 Obesity, unspecified: Secondary | ICD-10-CM | POA: Diagnosis not present

## 2023-05-05 DIAGNOSIS — Z0000 Encounter for general adult medical examination without abnormal findings: Secondary | ICD-10-CM

## 2023-05-05 MED ORDER — WEGOVY 0.25 MG/0.5ML ~~LOC~~ SOAJ
0.2500 mg | SUBCUTANEOUS | 0 refills | Status: DC
Start: 2023-05-05 — End: 2023-09-06

## 2023-05-05 NOTE — Assessment & Plan Note (Signed)
Annual physical exam. Up-to-date on screenings.

## 2023-05-05 NOTE — Progress Notes (Signed)
Subjective:    CC: Annual Physical Exam  HPI:  This patient is here for their annual physical  I reviewed the past medical history, family history, social history, surgical history, and allergies today and no changes were needed.  Please see the problem list section below in epic for further details.  Past Medical History: Past Medical History:  Diagnosis Date   Anxiety    Arthritis    back    Asthma    childhood   COVID-19    x3   Depression    GERD (gastroesophageal reflux disease)    History of colon polyps    Hypertension    PTSD (post-traumatic stress disorder)    Past Surgical History: Past Surgical History:  Procedure Laterality Date   CESAREAN SECTION     CHOLECYSTECTOMY N/A 03/06/2018   Procedure: LAPAROSCOPIC CHOLECYSTECTOMY ERAS PATHWAY;  Surgeon: Berna Bue, MD;  Location: [facility];  Service: General;  Laterality: N/A;   COLONOSCOPY     In [facility] [phone] did have a small polyp   DILATION AND CURETTAGE OF UTERUS N/A    DILATION AND CURETTAGE OF UTERUS N/A    DILATION AND CURETTAGE OF UTERUS N/A    DILATION AND CURETTAGE OF UTERUS N/A    DILATION AND CURETTAGE OF UTERUS     tubaligation     Social History: Social History   Socioeconomic History   Marital status: Divorced    Spouse name: Not on file   Number of children: 2   Years of education: Not on file   Highest education level: Associate degree: academic program  Occupational History   Not on file  Tobacco Use   Smoking status: Former    Current packs/day: 0.00    Types: Cigarettes    Quit date: 04/2021    Years since quitting: 2.0   Smokeless tobacco: Never  Vaping Use   Vaping status: Never Used  Substance and Sexual Activity   Alcohol use: Yes    Comment: weekends    Drug use: No   Sexual activity: Not on file  Other Topics Concern   Not on file  Social History Narrative   Not on file   Social Determinants of Health   Financial Resource Strain:  Not on file  Food Insecurity: No Food Insecurity (04/07/2023)   [facility]   Hunger Vital Sign    Worried About Running Out of Food in the Last Year: Never true    Ran Out of Food in the Last Year: Never true  Transportation Needs: No Transportation Needs (04/07/2023)   Received from [facility] - Transportation    Lack of Transportation (Medical): No    Lack of Transportation (Non-Medical): No  Physical Activity: Not on file  Stress: No Stress Concern Present (04/07/2023)   Received from [facility] - Occupational Stress Questionnaire    Feeling of Stress : Not at all  Social Connections: Unknown (02/05/2023)   Received from [facility]   Social Network    Social Network: Not on file   Family History: Family History  Problem Relation Age of Onset   Depression Mother    Asthma Mother    Heart failure Father    Asthma Sister    Asthma Brother    Post-traumatic stress disorder Brother    Drug abuse Other    Drug abuse Daughter    Diabetes Neg Hx  Colon cancer Neg Hx    Esophageal cancer Neg Hx    Allergies: Allergies  Allergen Reactions   Acetaminophen-Codeine Other (See Comments)    Shortness of breath. This was the codeine component, not tylenol.   Povidone-Iodine Other (See Comments) and Rash   Iodinated Contrast Media Rash    Felt like she was itching on the inside; needs 13-hour prep   Iodine Solution [Povidone Iodine] Rash   Oxycodone Itching   Medications: See med rec.  Review of Systems: No headache, visual changes, nausea, vomiting, diarrhea, constipation, dizziness, abdominal pain, skin rash, fevers, chills, night sweats, swollen lymph nodes, weight loss, chest pain, body aches, joint swelling, muscle aches, shortness of breath, mood changes, visual or auditory hallucinations.  Objective:    General: Well Developed, well nourished, and in no acute distress.  Neuro:  Alert and oriented x3, extra-ocular muscles intact, sensation grossly intact. Cranial nerves II through XII are intact, motor, sensory, and coordinative functions are all intact. HEENT: Normocephalic, atraumatic, pupils equal round reactive to light, neck supple, no masses, no lymphadenopathy, thyroid nonpalpable. Oropharynx, nasopharynx, external ear canals are unremarkable. Skin: Warm and dry, no rashes noted.  Cardiac: Irregularly irregular rate and rhythm, no murmurs rubs or gallops.  Respiratory: Clear to auscultation bilaterally. Not using accessory muscles, speaking in full sentences.  Abdominal: Soft, nontender, nondistended, positive bowel sounds, no masses, no organomegaly.  Musculoskeletal: Shoulder, elbow, wrist, hip, knee, ankle stable, and with full range of motion.  Impression and Recommendations:    The patient was counselled, risk factors were discussed, anticipatory guidance given.  Sinus arrhythmia Irregular rate and rhythm today, on review of previous ECG reports she does have marked sinus arrhythmia, she is asymptomatic so we will watch this for now.  Annual physical exam Annual physical exam. Up-to-date on screenings.  Obesity (BMI 35.0-39.9 without comorbidity) This pleasant 52 year old female who struggled with weight all of her life, she is morbidly obese, historically she has done well with [facility] although last year she was unable to find the necessary dosages. As the shortage has improved we will try to get her approved for [facility] again, she will be enrolled in a multidisciplinary weight loss program with calorie counting, and exercise prescription, we will start [facility], return to see me after 1 to 2 months.  H/O total vaginal hysterectomy History of polycystic ovarian disease, she did have a cervical mass, she is status post total vaginal hysterectomy with bilateral salpingo-oophorectomy, ultimately surgical pathology revealed a leiomyoma. She is having surgical  menopausal vasomotor instability not controlled with gabapentin, she is also having excessive sedation with gabapentin. Options include SSRIs and Veozah, I will give her some information and she will let me know.   ____________________________________________ Ihor Austin. Benjamin Stain, M.D., ABFM., CAQSM., AME. Primary Care and Sports Medicine [facility]  Adjunct Professor of Family Medicine  [place] [employer]  Restaurant manager, fast food

## 2023-05-05 NOTE — Assessment & Plan Note (Signed)
This pleasant 52 year old female who struggled with weight all of her life, she is morbidly obese, historically she has done well with [facility] although last year she was unable to find the necessary dosages. As the shortage has improved we will try to get her approved for [facility] again, she will be enrolled in a multidisciplinary weight loss program with calorie counting, and exercise prescription, we will start [facility], return to see me after 1 to 2 months.

## 2023-05-05 NOTE — Assessment & Plan Note (Signed)
History of polycystic ovarian disease, she did have a cervical mass, she is status post total vaginal hysterectomy with bilateral salpingo-oophorectomy, ultimately surgical pathology revealed a leiomyoma. She is having surgical menopausal vasomotor instability not controlled with gabapentin, she is also having excessive sedation with gabapentin. Options include SSRIs and Veozah, I will give her some information and she will let me know.

## 2023-05-05 NOTE — Assessment & Plan Note (Signed)
Irregular rate and rhythm today, on review of previous ECG reports she does have marked sinus arrhythmia, she is asymptomatic so we will watch this for now.

## 2023-05-06 LAB — CBC
Hematocrit: 39 % (ref 34.0–46.6)
Hemoglobin: 12.5 g/dL (ref 11.1–15.9)
MCH: 25.9 pg — ABNORMAL LOW (ref 26.6–33.0)
MCHC: 32.1 g/dL (ref 31.5–35.7)
MCV: 81 fL (ref 79–97)
Platelets: 373 x10E3/uL (ref 150–450)
RBC: 4.82 x10E6/uL (ref 3.77–5.28)
RDW: 13.8 % (ref 11.7–15.4)
WBC: 5.7 x10E3/uL (ref 3.4–10.8)

## 2023-05-06 LAB — COMPREHENSIVE METABOLIC PANEL WITH GFR
ALT: 34 IU/L — ABNORMAL HIGH (ref 0–32)
AST: 35 IU/L (ref 0–40)
Albumin: 4.3 g/dL (ref 3.8–4.9)
Alkaline Phosphatase: 143 IU/L — ABNORMAL HIGH (ref 44–121)
BUN/Creatinine Ratio: 10 (ref 9–23)
BUN: 10 mg/dL (ref 6–24)
Bilirubin Total: 0.5 mg/dL (ref 0.0–1.2)
CO2: 23 mmol/L (ref 20–29)
Calcium: 9.4 mg/dL (ref 8.7–10.2)
Chloride: 102 mmol/L (ref 96–106)
Creatinine, Ser: 0.96 mg/dL (ref 0.57–1.00)
Globulin, Total: 3.4 g/dL (ref 1.5–4.5)
Glucose: 79 mg/dL (ref 70–99)
Potassium: 4.7 mmol/L (ref 3.5–5.2)
Sodium: 140 mmol/L (ref 134–144)
Total Protein: 7.7 g/dL (ref 6.0–8.5)
eGFR: 72 mL/min/1.73 (ref >59)

## 2023-05-06 LAB — LIPID PANEL
Chol/HDL Ratio: 5.6 ratio — ABNORMAL HIGH (ref 0.0–4.4)
Cholesterol, Total: 237 mg/dL — ABNORMAL HIGH (ref 100–199)
HDL: 42 mg/dL (ref >39)
LDL Chol Calc (NIH): 172 mg/dL — ABNORMAL HIGH (ref 0–99)
Triglycerides: 126 mg/dL (ref 0–149)
VLDL Cholesterol Cal: 23 mg/dL (ref 5–40)

## 2023-05-06 LAB — HEMOGLOBIN A1C
Est. average glucose Bld gHb Est-mCnc: 123 mg/dL
Hgb A1c MFr Bld: 5.9 % — ABNORMAL HIGH (ref 4.8–5.6)

## 2023-05-06 LAB — TSH: TSH: 0.74 u[IU]/mL (ref 0.450–4.500)

## 2023-05-08 ENCOUNTER — Encounter (INDEPENDENT_AMBULATORY_CARE_PROVIDER_SITE_OTHER): Payer: Managed Care, Other (non HMO) | Admitting: Sports Medicine

## 2023-05-08 DIAGNOSIS — E782 Mixed hyperlipidemia: Secondary | ICD-10-CM | POA: Diagnosis not present

## 2023-05-09 DIAGNOSIS — E782 Mixed hyperlipidemia: Secondary | ICD-10-CM | POA: Insufficient documentation

## 2023-05-09 MED ORDER — ROSUVASTATIN CALCIUM 40 MG PO TABS: 40.0000 mg | ORAL_TABLET | Freq: Every day | ORAL | 3 refills | Status: DC

## 2023-05-09 NOTE — Assessment & Plan Note (Signed)
Moderately elevated lipids, adding rosuvastatin 40 with a 2 to 5-month fasting lipid recheck

## 2023-05-09 NOTE — Telephone Encounter (Signed)
I spent 5 total minutes of online digital evaluation and management services in this patient-initiated request for online care. 

## 2023-06-15 LAB — HM MAMMOGRAPHY

## 2023-07-10 ENCOUNTER — Encounter: Payer: Self-pay | Admitting: Sports Medicine

## 2023-07-31 ENCOUNTER — Ambulatory Visit (INDEPENDENT_AMBULATORY_CARE_PROVIDER_SITE_OTHER): Payer: Managed Care, Other (non HMO) | Admitting: Sports Medicine

## 2023-07-31 ENCOUNTER — Telehealth: Payer: Self-pay | Admitting: Sports Medicine

## 2023-07-31 ENCOUNTER — Encounter: Payer: Self-pay | Admitting: Sports Medicine

## 2023-07-31 VITALS — BP 143/78

## 2023-07-31 DIAGNOSIS — E669 Obesity, unspecified: Secondary | ICD-10-CM | POA: Diagnosis not present

## 2023-07-31 DIAGNOSIS — I10 Essential (primary) hypertension: Secondary | ICD-10-CM | POA: Diagnosis not present

## 2023-07-31 DIAGNOSIS — E782 Mixed hyperlipidemia: Secondary | ICD-10-CM

## 2023-07-31 NOTE — Assessment & Plan Note (Signed)
Patient has a some weight on her own, she will be in a multidisciplinary weight loss program with calorie counting, and exercise prescription, I called and [facility] 3 months ago and she has not heard back yet. We will see if we can work on the [place].

## 2023-07-31 NOTE — Assessment & Plan Note (Signed)
BP elevated, she is off of all medication. Likely related to work stressors. It was normal had a recent OB/GYN visit. We will watch this for now.

## 2023-07-31 NOTE — Telephone Encounter (Signed)
[name], ordered [facility] for this patient about 3 months ago and she tells me that her insurance company told her they have not heard back from [place].  Could you please take a look?

## 2023-07-31 NOTE — Progress Notes (Signed)
    Procedures performed today:    None.  Independent interpretation of notes and tests performed by another provider:   None.  Brief History, Exam, Impression, and Recommendations:    Benign essential hypertension BP elevated, she is off of all medication. Likely related to work stressors. It was normal had a recent OB/GYN visit. We will watch this for now.  Mixed hyperlipidemia LDL elevated, added rosuvastatin 40, rechecking lipids today.  Obesity (BMI 35.0-39.9 without comorbidity) Patient has a some weight on her own, she will be in a multidisciplinary weight loss program with calorie counting, and exercise prescription, I called and Wegovy 3 months ago and she has not heard back yet. We will see if we can work on the [place].    ____________________________________________ Ihor Austin. Benjamin Stain, M.D., ABFM., CAQSM., AME. Primary Care and Sports Medicine [facility]  Adjunct Professor of Family Medicine  [place] [employer]  Restaurant manager, fast food

## 2023-07-31 NOTE — Assessment & Plan Note (Signed)
LDL elevated, added rosuvastatin 40, rechecking lipids today.

## 2023-08-01 ENCOUNTER — Telehealth: Payer: Self-pay

## 2023-08-01 LAB — COMPREHENSIVE METABOLIC PANEL
ALT: 24 [IU]/L (ref 0–32)
AST: 22 [IU]/L (ref 0–40)
Albumin: 4.1 g/dL (ref 3.8–4.9)
Alkaline Phosphatase: 134 [IU]/L — ABNORMAL HIGH (ref 44–121)
BUN/Creatinine Ratio: 10 (ref 9–23)
BUN: 9 mg/dL (ref 6–24)
Bilirubin Total: 0.5 mg/dL (ref 0.0–1.2)
CO2: 23 mmol/L (ref 20–29)
Calcium: 9.2 mg/dL (ref 8.7–10.2)
Chloride: 102 mmol/L (ref 96–106)
Creatinine, Ser: 0.89 mg/dL (ref 0.57–1.00)
Globulin, Total: 3.3 g/dL (ref 1.5–4.5)
Glucose: 68 mg/dL — ABNORMAL LOW (ref 70–99)
Potassium: 4.1 mmol/L (ref 3.5–5.2)
Sodium: 141 mmol/L (ref 134–144)
Total Protein: 7.4 g/dL (ref 6.0–8.5)
eGFR: 78 mL/min/{1.73_m2} (ref >59)

## 2023-08-01 LAB — LIPID PANEL
Chol/HDL Ratio: 3.7 ratio (ref 0.0–4.4)
Cholesterol, Total: 211 mg/dL — ABNORMAL HIGH (ref 100–199)
HDL: 57 mg/dL (ref >39)
LDL Chol Calc (NIH): 142 mg/dL — ABNORMAL HIGH (ref 0–99)
Triglycerides: 70 mg/dL (ref 0–149)
VLDL Cholesterol Cal: 12 mg/dL (ref 5–40)

## 2023-08-01 NOTE — Telephone Encounter (Signed)
Initiated Prior authorization ZOX:WRUEAV 0.25MG /0.5ML auto-injectors Via: Covermymeds Case/Key:[id number] Status: approved  as of 08/01/23 Reason:Coverage Start Date:07/02/2023;Coverage End Date:02/27/2024; Notified Pt via: Mychart

## 2023-09-05 ENCOUNTER — Other Ambulatory Visit: Payer: Self-pay | Admitting: Sports Medicine

## 2023-09-05 DIAGNOSIS — E669 Obesity, unspecified: Secondary | ICD-10-CM

## 2023-09-05 NOTE — Telephone Encounter (Signed)
Before I refill this please ask her if she wants to stay on the 0.25 or if she is agreeable to go up on the dose

## 2023-09-06 MED ORDER — WEGOVY 1.7 MG/0.75ML ~~LOC~~ SOAJ: 1.7000 mg | SUBCUTANEOUS | 0 refills | Status: DC

## 2023-09-06 MED ORDER — WEGOVY 0.5 MG/0.5ML ~~LOC~~ SOAJ
0.5000 mg | SUBCUTANEOUS | 0 refills | Status: DC
Start: 2023-09-06 — End: 2023-10-31

## 2023-09-06 MED ORDER — WEGOVY 1 MG/0.5ML ~~LOC~~ SOAJ: 1.0000 mg | SUBCUTANEOUS | 0 refills | Status: DC

## 2023-09-06 MED ORDER — WEGOVY 2.4 MG/0.75ML ~~LOC~~ SOAJ: 2.4000 mg | SUBCUTANEOUS | 11 refills | Status: DC

## 2023-09-06 NOTE — Telephone Encounter (Signed)
Patient is okay to increase Wegovy rx to the next dose.

## 2023-09-06 NOTE — Telephone Encounter (Signed)
Okay I have sent in the next several doses of Wegovy, she can titrate monthly until reaching the maximum tolerated dose

## 2023-10-31 ENCOUNTER — Ambulatory Visit: Payer: Managed Care, Other (non HMO) | Admitting: Sports Medicine

## 2023-10-31 VITALS — BP 116/79 | HR 85 | Ht 65.0 in | Wt 265.0 lb

## 2023-10-31 DIAGNOSIS — E669 Obesity, unspecified: Secondary | ICD-10-CM | POA: Diagnosis not present

## 2023-10-31 DIAGNOSIS — K219 Gastro-esophageal reflux disease without esophagitis: Secondary | ICD-10-CM

## 2023-10-31 DIAGNOSIS — E782 Mixed hyperlipidemia: Secondary | ICD-10-CM

## 2023-10-31 NOTE — Progress Notes (Signed)
    Procedures performed today:    None.  Independent interpretation of notes and tests performed by another provider:   None.  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 The next month she will be going to 1.7 and then 2.4, I like to see her after a solid 6 months on 2.4 mg so we will see her back in 7 months for a weight check. She was unable to tolerate Crestor however I think the dramatic weight loss will likely help the cholesterol on its own.  Mixed hyperlipidemia She was unable to tolerate Crestor however I think the dramatic weight loss will likely help the cholesterol on its own.  Gastro-esophageal reflux Was doing some Protonix, has discontinued this, known acid reflux. Upper GI series showed no evidence of esophageal webs, or achalasia but she did have some antritis. I have advised her to work on a GERD type diet, avoidance of eating within 3 hours of bedtime. If persistent symptomatology we will consider the readdition of PPI twice daily.    ____________________________________________ Ihor Austin. Benjamin Stain, M.D., ABFM., CAQSM., AME. Primary Care and Sports Medicine [facility]  Adjunct Professor of Family Medicine  [employer] [facility]  Restaurant manager, fast food

## 2023-10-31 NOTE — Assessment & Plan Note (Signed)
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 The next month she will be going to 1.7 and then 2.4, I like to see her after a solid 6 months on 2.4 mg so we will see her back in 7 months for a weight check. She was unable to tolerate Crestor however I think the dramatic weight loss will likely help the cholesterol on its own.

## 2023-10-31 NOTE — Assessment & Plan Note (Signed)
She was unable to tolerate Crestor however I think the dramatic weight loss will likely help the cholesterol on its own.

## 2023-10-31 NOTE — Assessment & Plan Note (Signed)
Was doing some Protonix, has discontinued this, known acid reflux. Upper GI series showed no evidence of esophageal webs, or achalasia but she did have some antritis. I have advised her to work on a GERD type diet, avoidance of eating within 3 hours of bedtime. If persistent symptomatology we will consider the readdition of PPI twice daily.

## 2023-11-08 ENCOUNTER — Encounter: Payer: Managed Care, Other (non HMO) | Admitting: Sports Medicine

## 2023-11-11 ENCOUNTER — Other Ambulatory Visit: Payer: Self-pay | Admitting: Sports Medicine

## 2023-11-11 DIAGNOSIS — E669 Obesity, unspecified: Secondary | ICD-10-CM

## 2023-11-12 MED ORDER — WEGOVY 2.4 MG/0.75ML ~~LOC~~ SOAJ: 2.4000 mg | SUBCUTANEOUS | 11 refills | Status: AC

## 2023-12-06 ENCOUNTER — Other Ambulatory Visit: Payer: Self-pay | Admitting: Sports Medicine

## 2023-12-06 DIAGNOSIS — E669 Obesity, unspecified: Secondary | ICD-10-CM

## 2023-12-10 ENCOUNTER — Ambulatory Visit
Admission: RE | Admit: 2023-12-10 | Discharge: 2023-12-10 | Disposition: A | Discharge status: Home / Self Care | Source: Ambulatory Visit | Attending: Family Medicine | Admitting: Family Medicine

## 2023-12-10 VITALS — BP 115/79 | HR 90 | Temp 98.2°F | Resp 18 | Ht 65.0 in | Wt 260.0 lb

## 2023-12-10 DIAGNOSIS — J069 Acute upper respiratory infection, unspecified: Secondary | ICD-10-CM | POA: Diagnosis not present

## 2023-12-10 LAB — POC SARS CORONAVIRUS 2 AG -  ED: SARS Coronavirus 2 Ag: NEGATIVE

## 2023-12-10 LAB — POCT INFLUENZA A/B
Influenza A, POC: NEGATIVE
Influenza B, POC: NEGATIVE

## 2023-12-10 MED ORDER — AMOXICILLIN-POT CLAVULANATE 875-125 MG PO TABS: ORAL_TABLET | ORAL | 0 refills | Status: DC

## 2023-12-10 MED ORDER — PREDNISONE 20 MG PO TABS: ORAL_TABLET | ORAL | 0 refills | Status: DC

## 2023-12-10 NOTE — ED Provider Notes (Signed)
 [facility]    CSN: [id number] Arrival date & time: 12/10/23  1152      History   Chief Complaint Chief Complaint  Patient presents with   Fever    Flu like symptoms cough, chills, fever, aching, runny nose - Entered by patient    HPI Heather Salazar is a 53 y.o. female.   HPI  Past Medical History:  Diagnosis Date   Anxiety    Arthritis    back    Asthma    childhood   COVID-19    x3   Depression    GERD (gastroesophageal reflux disease)    History of colon polyps    Hypertension    PTSD (post-traumatic stress disorder)     Patient Active Problem List   Diagnosis Date Noted   Mixed hyperlipidemia 05/09/2023   Sinus arrhythmia 05/05/2023   Plantar fasciitis, bilateral 12/30/2022   Eczematous and venous stasis dermatitis 12/30/2022   Generalized anxiety disorder 04/28/2022   Retroperitoneal lymphadenopathy 12/21/2020   Depression, major, single episode, moderate (HCC) 11/12/2020   Lumbar degenerative disc disease 11/12/2020   Persistent asthma without complication 10/13/2020   Smoker 11/29/2019   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   Insomnia 03/05/2019   Gastro-esophageal reflux 09/13/2018   Benign essential hypertension 01/01/2018   Biliary colic 12/28/2017   Annual physical exam 01/16/2015   H/O total vaginal hysterectomy 01/16/2015   Obesity (BMI 35.0-39.9 without comorbidity) 01/16/2015    Past Surgical History:  Procedure Laterality Date   ABDOMINAL HYSTERECTOMY     CESAREAN SECTION     CHOLECYSTECTOMY N/A 03/06/2018   Procedure: LAPAROSCOPIC CHOLECYSTECTOMY ERAS PATHWAY;  Surgeon: Berna Bue, MD;  Location: [facility];  Service: General;  Laterality: N/A;   COLONOSCOPY     In [facility] [phone] did have a small polyp   DILATION AND CURETTAGE OF UTERUS N/A    DILATION AND CURETTAGE OF UTERUS N/A    DILATION AND CURETTAGE OF UTERUS N/A    DILATION AND CURETTAGE OF UTERUS N/A    DILATION AND CURETTAGE OF  UTERUS     tubaligation      OB History     Gravida  7   Para  2   Term  2   Preterm      AB  5   Living  2      SAB  5   IAB      Ectopic      Multiple      Live Births  2            Home Medications    Prior to Admission medications   Medication Sig Start Date End Date Taking? Authorizing Provider  acetaminophen (TYLENOL) 650 MG CR tablet Take 1,300 mg by mouth daily with breakfast.   Yes [provider]  amoxicillin-clavulanate (AUGMENTIN) 875-125 MG tablet Take one tab PO Q12hr with food 12/10/23  Yes Dushawn Pusey, Tera Mater, MD  predniSONE (DELTASONE) 20 MG tablet Take one tab by mouth twice daily for 4 days, then one daily for 3 days. Take with food. 12/10/23  Yes Lattie Haw, MD  triamcinolone ointment (KENALOG) 0.5 % Apply 1 Application topically 2 (two) times daily. To affected area, avoid eyes and face 12/30/22  Yes Monica Becton, MD  WEGOVY 2.4 MG/0.75ML SOAJ Inject 2.4 mg into the skin once a week. 11/12/23  Yes Monica Becton, MD  ondansetron (ZOFRAN-ODT) 8 MG disintegrating tablet Take 1 tablet (8 mg  total) by mouth every 8 (eight) hours as needed for nausea. 02/24/22   Monica Becton, MD    Family History Family History  Problem Relation Age of Onset   Depression Mother    Asthma Mother    Heart failure Father    Asthma Sister    Asthma Brother    Post-traumatic stress disorder Brother    Drug abuse Other    Drug abuse Daughter    Diabetes Neg Hx    Colon cancer Neg Hx    Esophageal cancer Neg Hx     Social History Social History   Tobacco Use   Smoking status: Former    Current packs/day: 0.00    Types: Cigarettes    Quit date: 04/2021    Years since quitting: 2.6   Smokeless tobacco: Never  Vaping Use   Vaping status: Never Used  Substance Use Topics   Alcohol use: Yes    Comment: weekends    Drug use: No     Allergies   Acetaminophen-codeine, Povidone-iodine, Iodinated contrast media, Iodine  solution [povidone iodine], and Oxycodone   Review of Systems Review of Systems   Physical Exam Triage Vital Signs ED Triage Vitals  Encounter Vitals Group     BP 12/10/23 1212 115/79     Systolic BP Percentile --      Diastolic BP Percentile --      Pulse Rate 12/10/23 1212 90     Resp 12/10/23 1212 18     Temp 12/10/23 1212 98.2 F (36.8 C)     Temp Source 12/10/23 1212 Oral     SpO2 12/10/23 1212 100 %     Weight 12/10/23 1214 260 lb (117.9 kg)     Height 12/10/23 1214 5\' 5"  (1.651 m)     Head Circumference --      Peak Flow --      Pain Score 12/10/23 1214 2     Pain Loc --      Pain Education --      Exclude from Growth Chart --    No data found.  Updated Vital Signs BP 115/79 (BP Location: Right Arm)   Pulse 90   Temp 98.2 F (36.8 C) (Oral)   Resp 18   Ht 5\' 5"  (1.651 m)   Wt 117.9 kg   LMP 09/06/2018 (Exact Date) Comment: patient signed preg test waiver  SpO2 100%   BMI 43.27 kg/m   Visual Acuity Right Eye Distance:   Left Eye Distance:   Bilateral Distance:    Right Eye Near:   Left Eye Near:    Bilateral Near:     Physical Exam   UC Treatments / Results  Labs (all labs ordered are listed, but only abnormal results are displayed) Labs Reviewed  POCT INFLUENZA A/B  POC SARS CORONAVIRUS 2 AG -  ED    EKG   Radiology No results found.  Procedures Procedures (including critical care time)  Medications Ordered in UC Medications - No data to display  Initial Impression / Assessment and Plan / UC Course  I have reviewed the triage vital signs and the nursing notes.  Pertinent labs & imaging results that were available during my care of the patient were reviewed by me and considered in my medical decision making (see chart for details).    Because of her history of asthma and recurring sinusitis, begin Augmentin and prednisone burst/taper. Followup with Family Doctor if not improved in one 7 to 10  days.  Final Clinical  Impressions(s) / UC Diagnoses   Final diagnoses:  Viral URI with cough     Discharge Instructions      Take plain guaifenesin (1200mg  extended release tabs such as Mucinex) twice daily, with plenty of water, for cough and congestion.  May add Pseudoephedrine (30mg , one or two every 4 to 6 hours) for sinus congestion.  Get adequate rest.   May use Afrin nasal spray (or generic oxymetazoline) each morning for about 5 days and then discontinue.  Also recommend using saline nasal spray several times daily and saline nasal irrigation (AYR is a common brand).  Use Flonase nasal spray each morning after using Afrin nasal spray and saline nasal irrigation. Try warm salt water gargles for sore throat.  Stop all antihistamines (Zyrtec, Benadryl, etc) for now, and other non-prescription cough/cold preparations. May take Delsym Cough Suppressant ("12 Hour Cough Relief") at bedtime for nighttime cough.   If symptoms become significantly worse during the night or over the weekend, proceed to the local emergency room.       ED Prescriptions     Medication Sig Dispense Auth. Provider   amoxicillin-clavulanate (AUGMENTIN) 875-125 MG tablet Take one tab PO Q12hr with food 14 tablet Lattie Haw, MD   predniSONE (DELTASONE) 20 MG tablet Take one tab by mouth twice daily for 4 days, then one daily for 3 days. Take with food. 11 tablet Lattie Haw, MD

## 2023-12-10 NOTE — Discharge Instructions (Signed)
 Take plain guaifenesin (1200mg  extended release tabs such as Mucinex) twice daily, with plenty of water, for cough and congestion.  May add Pseudoephedrine (30mg , one or two every 4 to 6 hours) for sinus congestion.  Get adequate rest.   May use Afrin nasal spray (or generic oxymetazoline) each morning for about 5 days and then discontinue.  Also recommend using saline nasal spray several times daily and saline nasal irrigation (AYR is a common brand).  Use Flonase nasal spray each morning after using Afrin nasal spray and saline nasal irrigation. Try warm salt water gargles for sore throat.  Stop all antihistamines (Zyrtec, Benadryl, etc) for now, and other non-prescription cough/cold preparations. May take Delsym Cough Suppressant ("12 Hour Cough Relief") at bedtime for nighttime cough.   If symptoms become significantly worse during the night or over the weekend, proceed to the local emergency room.

## 2023-12-10 NOTE — ED Triage Notes (Signed)
 Patient c/o fever, cough and body aches x 2 days.  Fever of 101.8 last night.  Patient has taken Zyrtec and Benadryl.

## 2024-02-04 ENCOUNTER — Other Ambulatory Visit: Payer: Self-pay

## 2024-02-04 ENCOUNTER — Ambulatory Visit
Admission: RE | Admit: 2024-02-04 | Discharge: 2024-02-04 | Disposition: A | Discharge status: Home / Self Care | Source: Ambulatory Visit | Attending: Family Medicine | Admitting: Family Medicine

## 2024-02-04 VITALS — BP 140/87 | HR 96 | Temp 98.9°F | Resp 16

## 2024-02-04 DIAGNOSIS — J0300 Acute streptococcal tonsillitis, unspecified: Secondary | ICD-10-CM

## 2024-02-04 LAB — POCT RAPID STREP A (OFFICE): Rapid Strep A Screen: POSITIVE — AB

## 2024-02-04 MED ORDER — PENICILLIN G BENZATHINE 1200000 UNIT/2ML IM SUSY
1.2000 10*6.[IU] | PREFILLED_SYRINGE | Freq: Once | INTRAMUSCULAR | Status: AC
  Administered 2024-02-04: 1.2 10*6.[IU] via INTRAMUSCULAR

## 2024-02-04 MED ORDER — DEXAMETHASONE 6 MG PO TABS
10.0000 mg | ORAL_TABLET | Freq: Once | ORAL | Status: AC
  Administered 2024-02-04: 10 mg via ORAL

## 2024-02-04 NOTE — ED Triage Notes (Signed)
 Late Friday night started having sore throat. Hurts to swallow. Has blisters on back of tongue. Felt like has had fever. Has had sea moss.

## 2024-02-04 NOTE — Discharge Instructions (Signed)
 The penicillin will take care of your strep infection.  You will not need any additional the Decadron  pill will help reduce pain and swelling in your tonsils As soon as you are able try to increase your fluids.  Sip on small amount of cold liquids May take Tylenol  or ibuprofen as needed for throat pain May use Chloraseptic lozenges or spray, sip on warm tea with honey, salt water gargles Call for problems

## 2024-02-04 NOTE — ED Provider Notes (Signed)
 [facility]    CSN: [id number] Arrival date & time: 02/04/24  4098      History   Chief Complaint Chief Complaint  Patient presents with   Sore Throat    HPI Heather Salazar is a 53 y.o. female.   Patient is on her second day of illness.  Severe sore throat.  Can hardly swallow.  Does not know where she may have gotten this from.  She does have some headache.  She has tried some holistic products.  Is here for evaluation.    Past Medical History:  Diagnosis Date   Anxiety    Arthritis    back    Asthma    childhood   COVID-19    x3   Depression    GERD (gastroesophageal reflux disease)    History of colon polyps    Hypertension    PTSD (post-traumatic stress disorder)     Patient Active Problem List   Diagnosis Date Noted   Mixed hyperlipidemia 05/09/2023   Sinus arrhythmia 05/05/2023   Plantar fasciitis, bilateral 12/30/2022   Eczematous and venous stasis dermatitis 12/30/2022   Generalized anxiety disorder 04/28/2022   Retroperitoneal lymphadenopathy 12/21/2020   Depression, major, single episode, moderate (HCC) 11/12/2020   Lumbar degenerative disc disease 11/12/2020   Persistent asthma without complication 10/13/2020   Smoker 11/29/2019   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   Insomnia 03/05/2019   Gastro-esophageal reflux 09/13/2018   Benign essential hypertension 01/01/2018   Biliary colic 12/28/2017   Annual physical exam 01/16/2015   H/O total vaginal hysterectomy 01/16/2015   Obesity (BMI 35.0-39.9 without comorbidity) 01/16/2015    Past Surgical History:  Procedure Laterality Date   ABDOMINAL HYSTERECTOMY     CESAREAN SECTION     CHOLECYSTECTOMY N/A 03/06/2018   Procedure: LAPAROSCOPIC CHOLECYSTECTOMY ERAS PATHWAY;  Surgeon: Adalberto Acton, MD;  Location: [facility];  Service: General;  Laterality: N/A;   COLONOSCOPY     In [facility] [phone] did have a small polyp   DILATION AND CURETTAGE OF UTERUS N/A     DILATION AND CURETTAGE OF UTERUS N/A    DILATION AND CURETTAGE OF UTERUS N/A    DILATION AND CURETTAGE OF UTERUS N/A    DILATION AND CURETTAGE OF UTERUS     tubaligation      OB History     Gravida  7   Para  2   Term  2   Preterm      AB  5   Living  2      SAB  5   IAB      Ectopic      Multiple      Live Births  2            Home Medications    Prior to Admission medications   Medication Sig Start Date End Date Taking? Authorizing Provider  WEGOVY  2.4 MG/0.75ML SOAJ Inject 2.4 mg into the skin once a week. 11/12/23   Gean Keels, MD    Family History Family History  Problem Relation Age of Onset   Depression Mother    Asthma Mother    Heart failure Father    Asthma Sister    Asthma Brother    Post-traumatic stress disorder Brother    Drug abuse Other    Drug abuse Daughter    Diabetes Neg Hx    Colon cancer Neg Hx    Esophageal cancer Neg Hx     Social  History Social History   Tobacco Use   Smoking status: Former    Current packs/day: 0.00    Types: Cigarettes    Quit date: 04/2021    Years since quitting: 2.8   Smokeless tobacco: Never  Vaping Use   Vaping status: Never Used  Substance Use Topics   Alcohol use: Yes    Comment: weekends    Drug use: No     Allergies   Acetaminophen -codeine, Povidone-iodine, Iodinated contrast media, Iodine solution [povidone iodine], and Oxycodone    Review of Systems Review of Systems See HPI  Physical Exam Triage Vital Signs ED Triage Vitals  Encounter Vitals Group     BP 02/04/24 0943 (!) 140/87     Systolic BP Percentile --      Diastolic BP Percentile --      Pulse Rate 02/04/24 0943 96     Resp 02/04/24 0943 16     Temp 02/04/24 0943 98.9 F (37.2 C)     Temp src --      SpO2 02/04/24 0943 99 %     Weight --      Height --      Head Circumference --      Peak Flow --      Pain Score 02/04/24 0946 9     Pain Loc --      Pain Education --      Exclude from  Growth Chart --    No data found.  Updated Vital Signs BP (!) 140/87   Pulse 96   Temp 98.9 F (37.2 C)   Resp 16   LMP 09/06/2018 (Exact Date) Comment: patient signed preg test waiver  SpO2 99%       Physical Exam Constitutional:      General: She is not in acute distress.    Appearance: She is well-developed. She is ill-appearing.  HENT:     Head: Normocephalic and atraumatic.     Right Ear: Tympanic membrane normal.     Left Ear: Tympanic membrane normal.     Nose: Congestion present. No rhinorrhea.     Mouth/Throat:     Mouth: Mucous membranes are moist.     Pharynx: Uvula midline. Pharyngeal swelling, posterior oropharyngeal erythema and uvula swelling present.     Tonsils: No tonsillar exudate. 2+ on the right. 2+ on the left.  Eyes:     Conjunctiva/sclera: Conjunctivae normal.     Pupils: Pupils are equal, round, and reactive to light.  Cardiovascular:     Rate and Rhythm: Normal rate and regular rhythm.     Heart sounds: Normal heart sounds.  Pulmonary:     Effort: Pulmonary effort is normal. No respiratory distress.     Breath sounds: Normal breath sounds.  Abdominal:     General: There is no distension.     Palpations: Abdomen is soft.  Musculoskeletal:        General: Normal range of motion.     Cervical back: Normal range of motion.  Lymphadenopathy:     Cervical: Cervical adenopathy present.  Skin:    General: Skin is warm and dry.  Neurological:     Mental Status: She is alert.      UC Treatments / Results  Labs (all labs ordered are listed, but only abnormal results are displayed) Labs Reviewed  POCT RAPID STREP A (OFFICE) - Abnormal; Notable for the following components:      Result Value   Rapid Strep A Screen Positive (*)  All other components within normal limits    EKG   Radiology No results found.  Procedures Procedures (including critical care time)  Medications Ordered in UC Medications  dexamethasone  (DECADRON ) tablet  10 mg (10 mg Oral Given 02/04/24 1009)  penicillin g benzathine (BICILLIN LA) 1200000 UNIT/2ML injection 1.2 Million Units (1.2 Million Units Intramuscular Given 02/04/24 1009)    Initial Impression / Assessment and Plan / UC Course  I have reviewed the triage vital signs and the nursing notes.  Pertinent labs & imaging results that were available during my care of the patient were reviewed by me and considered in my medical decision making (see chart for details).     Patient tonsillitis from strep.  She is very uncomfortable.  Will give her a dose of steroids in addition to penicillin.  Patient requests a penicillin shot instead of having to swallow the pills.  This is provided Final Clinical Impressions(s) / UC Diagnoses   Final diagnoses:  Acute streptococcal tonsillitis, not specified as recurrent or not     Discharge Instructions      The penicillin will take care of your strep infection.  You will not need any additional the Decadron  pill will help reduce pain and swelling in your tonsils As soon as you are able try to increase your fluids.  Sip on small amount of cold liquids May take Tylenol  or ibuprofen as needed for throat pain May use Chloraseptic lozenges or spray, sip on warm tea with honey, salt water gargles Call for problems   ED Prescriptions   None    PDMP not reviewed this encounter.   Stephany Ehrich, MD 02/04/24 [phone]

## 2024-03-27 ENCOUNTER — Other Ambulatory Visit (HOSPITAL_COMMUNITY): Payer: Self-pay

## 2024-03-27 ENCOUNTER — Telehealth: Payer: Self-pay

## 2024-03-27 NOTE — Telephone Encounter (Signed)
 Pharmacy Patient Advocate Encounter  Received notification from [employer] that Prior Authorization for Wegovy  2.4mg /0.8ml has been APPROVED from 02/26/24 to 03/27/25   PA #/Case ID/Reference #: [id number]  Left a message at [facility] to notify of the approval.

## 2024-03-27 NOTE — Telephone Encounter (Signed)
 Patient has been advised, [facility] has reached out to advise medication is filled and ready.

## 2024-03-27 NOTE — Telephone Encounter (Signed)
 Pharmacy Patient Advocate Encounter   Received notification from Fax that prior authorization for Wegovy  2.4mg /0.45ml is required/requested.   Insurance verification completed.   The patient is insured through [employer] .   Per test claim: PA required; PA submitted to above mentioned insurance via CoverMyMeds Key/confirmation #/EOC [id number] Status is pending

## 2024-05-30 ENCOUNTER — Encounter: Payer: Self-pay | Admitting: Sports Medicine

## 2024-05-30 ENCOUNTER — Ambulatory Visit: Payer: Managed Care, Other (non HMO) | Admitting: Sports Medicine

## 2024-05-30 VITALS — BP 106/72 | HR 76 | Resp 20 | Ht 65.0 in | Wt 240.0 lb

## 2024-05-30 DIAGNOSIS — E782 Mixed hyperlipidemia: Secondary | ICD-10-CM

## 2024-05-30 DIAGNOSIS — E669 Obesity, unspecified: Secondary | ICD-10-CM | POA: Diagnosis not present

## 2024-05-30 DIAGNOSIS — G43809 Other migraine, not intractable, without status migrainosus: Secondary | ICD-10-CM

## 2024-05-30 MED ORDER — ONDANSETRON 8 MG PO TBDP
8.0000 mg | ORAL_TABLET | Freq: Three times a day (TID) | ORAL | 3 refills | Status: AC | PRN
Start: 2024-05-30 — End: ?

## 2024-05-30 NOTE — Assessment & Plan Note (Signed)
 Good continued weight loss on high dose Wegovy , adding a bit of Zofran  to take on her injection days and the day after where she does get a bit of nausea.

## 2024-05-30 NOTE — Assessment & Plan Note (Signed)
 Unable to tolerate Crestor , she has had a great deal of weight loss with Wegovy  so we will recheck her lipids today.  Update: Lipids still elevated, I do think we should try Crestor  again, maybe at 5 mg.

## 2024-05-30 NOTE — Progress Notes (Signed)
    Procedures performed today:    None.  Independent interpretation of notes and tests performed by another provider:   None.  Brief History, Exam, Impression, and Recommendations:    Vestibular migraine This very pleasant 53 year old female returns, she is having increasing headache, she takes about 800 mg of ibuprofen daily. We last talked about in 2021, she was having occasional migraines, some vestibular, she was having 5-10 headache days per month. Today we discussed treatment and prevention. At this point she will continue her ibuprofen though I have suggested considering prevention, topiramate  would be very effective and would also help with her weight loss. She will let me know if she would like to try this.  Obesity (BMI 35.0-39.9 without comorbidity) Good continued weight loss on high dose Wegovy , adding a bit of Zofran  to take on her injection days and the day after where she does get a bit of nausea.   Mixed hyperlipidemia Unable to tolerate Crestor , she has had a great deal of weight loss with Wegovy  so we will recheck her lipids today.  Update: Lipids still elevated, I do think we should try Crestor  again, maybe at 5 mg.    ____________________________________________ Debby PARAS. Curtis, M.D., ABFM., CAQSM., AME. Primary Care and Sports Medicine [facility]  Adjunct Professor of [employer] Medicine  [employer]  Restaurant manager, fast food

## 2024-05-30 NOTE — Assessment & Plan Note (Signed)
 This very pleasant 53 year old female returns, she is having increasing headache, she takes about 800 mg of ibuprofen daily. We last talked about in 2021, she was having occasional migraines, some vestibular, she was having 5-10 headache days per month. Today we discussed treatment and prevention. At this point she will continue her ibuprofen though I have suggested considering prevention, topiramate  would be very effective and would also help with her weight loss. She will let me know if she would like to try this.

## 2024-05-31 ENCOUNTER — Ambulatory Visit: Payer: Self-pay | Admitting: Sports Medicine

## 2024-05-31 DIAGNOSIS — G43809 Other migraine, not intractable, without status migrainosus: Secondary | ICD-10-CM

## 2024-05-31 LAB — TSH: TSH: 0.645 u[IU]/mL (ref 0.450–4.500)

## 2024-05-31 LAB — HEMOGLOBIN A1C
Est. average glucose Bld gHb Est-mCnc: 111 mg/dL
Hgb A1c MFr Bld: 5.5 % (ref 4.8–5.6)

## 2024-05-31 LAB — COMPREHENSIVE METABOLIC PANEL WITH GFR
ALT: 16 IU/L (ref 0–32)
AST: 16 IU/L (ref 0–40)
Albumin: 4.2 g/dL (ref 3.8–4.9)
Alkaline Phosphatase: 109 IU/L (ref 44–121)
BUN/Creatinine Ratio: 11 (ref 9–23)
BUN: 9 mg/dL (ref 6–24)
Bilirubin Total: 0.8 mg/dL (ref 0.0–1.2)
CO2: 20 mmol/L (ref 20–29)
Calcium: 9.1 mg/dL (ref 8.7–10.2)
Chloride: 105 mmol/L (ref 96–106)
Creatinine, Ser: 0.84 mg/dL (ref 0.57–1.00)
Globulin, Total: 2.8 g/dL (ref 1.5–4.5)
Glucose: 80 mg/dL (ref 70–99)
Potassium: 4.5 mmol/L (ref 3.5–5.2)
Sodium: 140 mmol/L (ref 134–144)
Total Protein: 7 g/dL (ref 6.0–8.5)
eGFR: 84 mL/min/1.73 (ref >59)

## 2024-05-31 LAB — CBC
Hematocrit: 42.5 % (ref 34.0–46.6)
Hemoglobin: 13.5 g/dL (ref 11.1–15.9)
MCH: 26.3 pg — ABNORMAL LOW (ref 26.6–33.0)
MCHC: 31.8 g/dL (ref 31.5–35.7)
MCV: 83 fL (ref 79–97)
Platelets: 228 x10E3/uL (ref 150–450)
RBC: 5.14 x10E6/uL (ref 3.77–5.28)
RDW: 14.2 % (ref 11.7–15.4)
WBC: 4.2 x10E3/uL (ref 3.4–10.8)

## 2024-05-31 LAB — LIPID PANEL
Chol/HDL Ratio: 4.4 ratio (ref 0.0–4.4)
Cholesterol, Total: 222 mg/dL — ABNORMAL HIGH (ref 100–199)
HDL: 51 mg/dL (ref >39)
LDL Chol Calc (NIH): 159 mg/dL — ABNORMAL HIGH (ref 0–99)
Triglycerides: 68 mg/dL (ref 0–149)
VLDL Cholesterol Cal: 12 mg/dL (ref 5–40)

## 2024-05-31 MED ORDER — ROSUVASTATIN CALCIUM 5 MG PO TABS: 5.0000 mg | ORAL_TABLET | Freq: Every day | ORAL | 3 refills | Status: AC

## 2024-05-31 NOTE — Addendum Note (Signed)
 Addended by: CURTIS DEBBY PARAS on: 05/31/2024 01:18 PM   Modules accepted: Orders

## 2024-06-03 MED ORDER — TOPIRAMATE 25 MG PO TABS: ORAL_TABLET | ORAL | 0 refills | Status: AC

## 2024-06-03 NOTE — Telephone Encounter (Signed)
 Topamax  taper sent.  Ok to start. Needs f/u with once of [facility] in about 8 weeks for HA.  If she does well on topamax  let us  know and can send updated refill.

## 2024-06-11 ENCOUNTER — Encounter: Payer: Self-pay | Admitting: Sports Medicine

## 2024-06-17 LAB — HM MAMMOGRAPHY

## 2024-07-02 ENCOUNTER — Encounter: Payer: Self-pay | Admitting: Physician Assistant

## 2024-08-09 ENCOUNTER — Ambulatory Visit
Admission: RE | Admit: 2024-08-09 | Discharge: 2024-08-09 | Disposition: A | Discharge status: Home / Self Care | Source: Ambulatory Visit | Attending: Family Medicine | Admitting: Family Medicine

## 2024-08-09 VITALS — BP 108/75 | HR 94 | Temp 98.1°F | Resp 19

## 2024-08-09 DIAGNOSIS — K219 Gastro-esophageal reflux disease without esophagitis: Secondary | ICD-10-CM | POA: Diagnosis not present

## 2024-08-09 DIAGNOSIS — S29011A Strain of muscle and tendon of front wall of thorax, initial encounter: Secondary | ICD-10-CM | POA: Diagnosis not present

## 2024-08-09 DIAGNOSIS — R079 Chest pain, unspecified: Secondary | ICD-10-CM | POA: Diagnosis not present

## 2024-08-09 MED ORDER — PANTOPRAZOLE SODIUM 40 MG PO TBEC: 40.0000 mg | DELAYED_RELEASE_TABLET | Freq: Every day | ORAL | 0 refills | Status: AC

## 2024-08-09 MED ORDER — METHOCARBAMOL 500 MG PO TABS: 500.0000 mg | ORAL_TABLET | Freq: Two times a day (BID) | ORAL | 0 refills | Status: AC

## 2024-08-09 NOTE — ED Provider Notes (Signed)
 [facility]    CSN: [id number] Arrival date & time: 08/09/24  0831      History   Chief Complaint Chief Complaint  Patient presents with   Abdominal Pain    HPI Heather Salazar is a 53 y.o. female.   HPI Pleasant 53 year old female presents with 10 out of 10 upper abdominal pain that feels like trapped gas in her stomach that began earlier this week and worsen yesterday.  PMH significant for obesity, anxiety, HTN, and GERD.  Past Medical History:  Diagnosis Date   Anxiety    Arthritis    back    Asthma    childhood   COVID-19    x3   Depression    GERD (gastroesophageal reflux disease)    History of colon polyps    Hypertension    PTSD (post-traumatic stress disorder)     Patient Active Problem List   Diagnosis Date Noted   Mixed hyperlipidemia 05/09/2023   Sinus arrhythmia 05/05/2023   Plantar fasciitis, bilateral 12/30/2022   Eczematous and venous stasis dermatitis 12/30/2022   Generalized anxiety disorder 04/28/2022   Retroperitoneal lymphadenopathy 12/21/2020   Depression, major, single episode, moderate (HCC) 11/12/2020   Lumbar degenerative disc disease 11/12/2020   Persistent asthma without complication 10/13/2020   Smoker 11/29/2019   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   Insomnia 03/05/2019   Gastro-esophageal reflux 09/13/2018   Benign essential hypertension 01/01/2018   Biliary colic 12/28/2017   Annual physical exam 01/16/2015   H/O total vaginal hysterectomy 01/16/2015   Obesity (BMI 35.0-39.9 without comorbidity) 01/16/2015    Past Surgical History:  Procedure Laterality Date   ABDOMINAL HYSTERECTOMY     CESAREAN SECTION     CHOLECYSTECTOMY N/A 03/06/2018   Procedure: LAPAROSCOPIC CHOLECYSTECTOMY ERAS PATHWAY;  Surgeon: Signe Mitzie LABOR, MD;  Location: [facility];  Service: General;  Laterality: N/A;   COLONOSCOPY     In [facility] [phone] did have a small polyp   DILATION AND CURETTAGE OF UTERUS N/A     DILATION AND CURETTAGE OF UTERUS N/A    DILATION AND CURETTAGE OF UTERUS N/A    DILATION AND CURETTAGE OF UTERUS N/A    DILATION AND CURETTAGE OF UTERUS     tubaligation      OB History     Gravida  7   Para  2   Term  2   Preterm      AB  5   Living  2      SAB  5   IAB      Ectopic      Multiple      Live Births  2            Home Medications    Prior to Admission medications   Medication Sig Start Date End Date Taking? Authorizing Provider  methocarbamol (ROBAXIN) 500 MG tablet Take 1 tablet (500 mg total) by mouth 2 (two) times daily. 08/09/24  Yes Teddy Sharper, FNP  pantoprazole  (PROTONIX ) 40 MG tablet Take 1 tablet (40 mg total) by mouth daily. 08/09/24  Yes Teddy Sharper, FNP  ondansetron  (ZOFRAN -ODT) 8 MG disintegrating tablet Take 1 tablet (8 mg total) by mouth every 8 (eight) hours as needed for nausea. 05/30/24   Curtis Debby PARAS, MD  rosuvastatin  (CRESTOR ) 5 MG tablet Take 1 tablet (5 mg total) by mouth daily. 05/31/24   Curtis Debby PARAS, MD  topiramate  (TOPAMAX ) 25 MG tablet Take 1 tablet (25 mg total) by mouth at  bedtime for 4 days, THEN 1 tablet (25 mg total) 2 (two) times daily for 14 days, THEN 2 tablets (50 mg total) 2 (two) times daily for 12 days. 06/03/24 07/03/24  Alvan Dorothyann BIRCH, MD  WEGOVY  2.4 MG/0.75ML SOAJ Inject 2.4 mg into the skin once a week. 11/12/23   Curtis Debby PARAS, MD    Family History Family History  Problem Relation Age of Onset   Depression Mother    Asthma Mother    Heart failure Father    Asthma Sister    Asthma Brother    Post-traumatic stress disorder Brother    Drug abuse Other    Drug abuse Daughter    Diabetes Neg Hx    Colon cancer Neg Hx    Esophageal cancer Neg Hx     Social History Social History   Tobacco Use   Smoking status: Former    Current packs/day: 0.00    Types: Cigarettes    Quit date: 04/2021    Years since quitting: 3.3   Smokeless tobacco: Never  Vaping Use    Vaping status: Never Used  Substance Use Topics   Alcohol use: Yes    Comment: weekends    Drug use: No     Allergies   Acetaminophen -codeine, Povidone-iodine, Iodinated contrast media, Iodine solution [povidone iodine], and Oxycodone    Review of Systems Review of Systems  Cardiovascular:  Positive for chest pain.  Gastrointestinal:        Upper abdominal pain/reflux in nature     Physical Exam Triage Vital Signs ED Triage Vitals [08/09/24 0841]  Encounter Vitals Group     BP 108/75     Girls Systolic BP Percentile      Girls Diastolic BP Percentile      Boys Systolic BP Percentile      Boys Diastolic BP Percentile      Pulse Rate 94     Resp 19     Temp 98.1 F (36.7 C)     Temp src      SpO2 98 %     Weight      Height      Head Circumference      Peak Flow      Pain Score 10     Pain Loc      Pain Education      Exclude from Growth Chart    No data found.  Updated Vital Signs BP 108/75   Pulse 94   Temp 98.1 F (36.7 C)   Resp 19   LMP 09/06/2018 (Exact Date) Comment: patient signed preg test waiver  SpO2 98%    Physical Exam Vitals and nursing note reviewed.  Constitutional:      Appearance: Normal appearance. She is obese.  HENT:     Head: Normocephalic and atraumatic.     Mouth/Throat:     Mouth: Mucous membranes are moist.     Pharynx: Oropharynx is clear.  Eyes:     Extraocular Movements: Extraocular movements intact.     Conjunctiva/sclera: Conjunctivae normal.     Pupils: Pupils are equal, round, and reactive to light.  Cardiovascular:     Rate and Rhythm: Normal rate and regular rhythm.     Pulses: Normal pulses.     Heart sounds: Normal heart sounds.  Pulmonary:     Effort: Pulmonary effort is normal.     Breath sounds: Normal breath sounds. No wheezing, rhonchi or rales.  Musculoskeletal:  General: Normal range of motion.  Skin:    General: Skin is warm and dry.  Neurological:     General: No focal deficit present.      Mental Status: She is alert and oriented to person, place, and time. Mental status is at baseline.  Psychiatric:        Mood and Affect: Mood normal.        Behavior: Behavior normal.      UC Treatments / Results  Labs (all labs ordered are listed, but only abnormal results are displayed) Labs Reviewed - No data to display  EKG   Radiology No results found.  Procedures Procedures (including critical care time)  Medications Ordered in UC Medications - No data to display  Initial Impression / Assessment and Plan / UC Course  I have reviewed the triage vital signs and the nursing notes.  Pertinent labs & imaging results that were available during my care of the patient were reviewed by me and considered in my medical decision making (see chart for details).     MDM: 1.  Chest pain, unspecified type-EKG revealed sinus bradycardia otherwise normal EKG; 2.  Gastroesophageal reflux disease, unspecified whether esophagitis present-Rx'd Protonix  40 mg tablet take 1 tablet daily-specific instructions provided to patient on how to take this medication.;  3.  Muscle strain of chest wall, initial encounter-Rx'd Robaxin 500 mg tablet: Take 1 tablet daily, as needed. Advised patient EKG was within normal limits.  Advised patient when taking the pantoprazole /Protonix  please take it first thing in the morning with 12 ounces of water on empty stomach and no other medications foods or beverages other than water for 45 minutes so that proton pump inhibitor can take effect.  Advised may use Robaxin daily or as needed for possible muscle strain of chest wall.  Encouraged to increase daily water intake to 64 ounces per day while taking these medications.  Advised if symptoms worsen and/or unresolved please follow-up with your PCP or here for further evaluation. Final Clinical Impressions(s) / UC Diagnoses   Final diagnoses:  Gastroesophageal reflux disease, unspecified whether esophagitis present   Chest pain, unspecified type  Muscle strain of chest wall, initial encounter     Discharge Instructions      Advised patient EKG was within normal limits.  Advised patient when taking the pantoprazole /Protonix  please take it first thing in the morning with 12 ounces of water on empty stomach and no other medications foods or beverages other than water for 45 minutes so that proton pump inhibitor can take effect.  Advised may use Robaxin daily or as needed for possible muscle strain of chest wall.  Encouraged to increase daily water intake to 64 ounces per day while taking these medications.  Advised if symptoms worsen and/or unresolved please follow-up with your PCP or here for further evaluation.     ED Prescriptions     Medication Sig Dispense Auth. Provider   pantoprazole  (PROTONIX ) 40 MG tablet Take 1 tablet (40 mg total) by mouth daily. 30 tablet Delray Reza, FNP   methocarbamol (ROBAXIN) 500 MG tablet Take 1 tablet (500 mg total) by mouth 2 (two) times daily. 20 tablet Delorise Hunkele, FNP      PDMP not reviewed this encounter.   Teddy Sharper, FNP 08/09/24 [phone]

## 2024-08-09 NOTE — Discharge Instructions (Addendum)
 Advised patient EKG was within normal limits.  Advised patient when taking the pantoprazole /Protonix  please take it first thing in the morning with 12 ounces of water on empty stomach and no other medications foods or beverages other than water for 45 minutes so that proton pump inhibitor can take effect.  Advised may use Robaxin daily or as needed for possible muscle strain of chest wall.  Encouraged to increase daily water intake to 64 ounces per day while taking these medications.  Advised if symptoms worsen and/or unresolved please follow-up with your PCP or here for further evaluation.

## 2024-08-09 NOTE — ED Triage Notes (Signed)
 Pt presents to [facility] with co of abdominal pain from earlier this week that felt like traped gas pt reports since yesterday she started having it move up to the epigastric area and Is painful with movement. Pain is sharp in nature 10/10. Pt has tried heating pad

## 2024-12-02 ENCOUNTER — Ambulatory Visit: Admitting: Medical-Surgical

## 2024-12-02 ENCOUNTER — Ambulatory Visit: Admitting: Sports Medicine
